# Patient Record
Sex: Female | Born: 1955 | Race: White | Hispanic: No | Marital: Married | Smoking: Never smoker
Health system: Southern US, Community
[De-identification: ages and names within clinical notes are randomized; demographics above are authoritative.]

## PROBLEM LIST (undated history)

## (undated) DIAGNOSIS — I341 Nonrheumatic mitral (valve) prolapse: Secondary | ICD-10-CM

## (undated) DIAGNOSIS — J45909 Unspecified asthma, uncomplicated: Secondary | ICD-10-CM

## (undated) DIAGNOSIS — M797 Fibromyalgia: Secondary | ICD-10-CM

## (undated) DIAGNOSIS — D0371 Melanoma in situ of right lower limb, including hip: Secondary | ICD-10-CM

## (undated) DIAGNOSIS — K589 Irritable bowel syndrome without diarrhea: Secondary | ICD-10-CM

## (undated) DIAGNOSIS — S098XXA Other specified injuries of head, initial encounter: Secondary | ICD-10-CM

## (undated) DIAGNOSIS — R740 Nonspecific elevation of levels of transaminase and lactic acid dehydrogenase [LDH]: Secondary | ICD-10-CM

## (undated) DIAGNOSIS — E785 Hyperlipidemia, unspecified: Secondary | ICD-10-CM

## (undated) DIAGNOSIS — I878 Other specified disorders of veins: Secondary | ICD-10-CM

## (undated) HISTORY — DX: Other specified injuries of head, initial encounter: S09.8XXA

## (undated) HISTORY — DX: Irritable bowel syndrome, unspecified: K58.9

## (undated) HISTORY — DX: Nonrheumatic mitral (valve) prolapse: I34.1

## (undated) HISTORY — DX: Hyperlipidemia, unspecified: E78.5

## (undated) HISTORY — DX: Melanoma in situ of right lower limb, including hip: D03.71

## (undated) HISTORY — PX: SALPINGOOPHORECTOMY: SHX82

## (undated) HISTORY — DX: Unspecified asthma, uncomplicated: J45.909

## (undated) HISTORY — DX: Other specified disorders of veins: I87.8

## (undated) HISTORY — DX: Nonspecific elevation of levels of transaminase and lactic acid dehydrogenase (ldh): R74.0

## (undated) HISTORY — DX: Fibromyalgia: M79.7

---

## 1984-06-04 HISTORY — PX: VAGINAL HYSTERECTOMY: SUR661

## 1984-06-04 HISTORY — PX: PARTIAL HYSTERECTOMY: SHX80

## 1986-12-05 DIAGNOSIS — S098XXA Other specified injuries of head, initial encounter: Secondary | ICD-10-CM

## 1986-12-05 HISTORY — DX: Other specified injuries of head, initial encounter: S09.8XXA

## 1987-06-05 HISTORY — PX: CRANIOTOMY: SHX93

## 1987-06-18 DIAGNOSIS — G43109 Migraine with aura, not intractable, without status migrainosus: Secondary | ICD-10-CM | POA: Insufficient documentation

## 1988-03-05 HISTORY — PX: CHOLECYSTECTOMY: SHX55

## 1993-12-05 HISTORY — PX: BREAST BIOPSY: SHX20

## 2005-11-22 ENCOUNTER — Other Ambulatory Visit: Admission: RE | Admit: 2005-11-22 | Discharge: 2005-11-22 | Payer: Self-pay | Admitting: Obstetrics and Gynecology

## 2007-08-06 HISTORY — PX: APPENDECTOMY: SHX54

## 2010-11-05 ENCOUNTER — Encounter: Payer: Self-pay | Admitting: Emergency Medicine

## 2010-11-05 DIAGNOSIS — R030 Elevated blood-pressure reading, without diagnosis of hypertension: Secondary | ICD-10-CM | POA: Insufficient documentation

## 2010-11-30 ENCOUNTER — Ambulatory Visit: Payer: Self-pay | Admitting: Family Medicine

## 2010-11-30 DIAGNOSIS — J4540 Moderate persistent asthma, uncomplicated: Secondary | ICD-10-CM | POA: Insufficient documentation

## 2010-11-30 DIAGNOSIS — J45909 Unspecified asthma, uncomplicated: Secondary | ICD-10-CM

## 2010-11-30 DIAGNOSIS — J4520 Mild intermittent asthma, uncomplicated: Secondary | ICD-10-CM | POA: Insufficient documentation

## 2010-11-30 DIAGNOSIS — J4530 Mild persistent asthma, uncomplicated: Secondary | ICD-10-CM | POA: Insufficient documentation

## 2010-12-16 ENCOUNTER — Ambulatory Visit
Admission: RE | Admit: 2010-12-16 | Discharge: 2010-12-16 | Payer: Self-pay | Source: Home / Self Care | Attending: Family Medicine | Admitting: Family Medicine

## 2010-12-16 DIAGNOSIS — I10 Essential (primary) hypertension: Secondary | ICD-10-CM

## 2010-12-26 ENCOUNTER — Encounter: Payer: Self-pay | Admitting: Obstetrics and Gynecology

## 2011-01-06 NOTE — Progress Notes (Signed)
Summary: Med & Problem List Brought by Patient  Med & Problem List Brought by Patient   Imported By: Lanelle Bal 12/13/2010 11:44:31  _____________________________________________________________________  External Attachment:    Type:   Image     Comment:   External Document
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
## 2011-01-06 NOTE — Assessment & Plan Note (Signed)
Summary: BP check with Nurse  Nurse Visit   Vital Signs:  Patient profile:   55 year old female Pulse rate:   80 / minute BP sitting:   159 / 92  (right arm) Cuff size:   regular  Vitals Entered By: Avon Gully CMA, Duncan Dull) (December 16, 2010 4:33 PM)  History of Present Illness: BP check    Impression & Recommendations:  Problem # 1:  HYPERTENSION (ICD-401.9)  Her BP was some better but definitely still has HTN.  Needs to start a medication and f/U with me in one month.   BP today: 159/92 Prior BP: 172/110 (11/30/2010)  Her updated medication list for this problem includes:    Losartan Potassium 25 Mg Tabs (Losartan potassium) .Marland Kitchen... 1/2 tab by mouth daily for one week, then inc to whole tab daily  Complete Medication List: 1)  Accolate 20 Mg Tabs (Zafirlukast) .... Take 1 tab by mouth two times a day 2)  Allegra Allergy 180 Mg Tabs (Fexofenadine hcl) .... Take 1 tab by mouth two times a day 3)  Tramadol Hcl 50 Mg Tabs (Tramadol hcl) .... Take 1 tab by mouth two times a day 4)  Digestive Enzymes Tabs (Digestive enzymes) 5)  Gnp Evening Primrose Oil 500 Mg Caps (Evening primrose oil) 6)  Feverfew 380 Mg Caps (Feverfew) 7)  Fish Oil Concentrate 1000 Mg Caps (Omega-3 fatty acids) 8)  Magnesium 200 Mg Tabs (Magnesium) 9)  Multivitamins Tabs (Multiple vitamin) 10)  Probiotic Caps (Probiotic product) 11)  Red Yeast Rice 600 Mg Caps (Red yeast rice extract) 12)  Proair Hfa 108 (90 Base) Mcg/act Aers (Albuterol sulfate) .... 2-4 puffs inhaled every 4-6 hours as needed shortness of breath 13)  Losartan Potassium 25 Mg Tabs (Losartan potassium) .... 1/2 tab by mouth daily for one week, then inc to whole tab daily    Allergies (verified): 1)  ! Biaxin (Clarithromycin) 2)  ! Carafate (Sucralfate) 3)  ! Cipro (Ciprofloxacin Hcl) 4)  ! Codeine Sulfate (Codeine Sulfate) 5)  ! Darvocet 6)  ! Darvon 7)  ! Demerol 8)  ! Dyazide 9)  ! Erythromycin 10)  ! Fiorinal  (Butalbital-Aspirin-Caffeine) 11)  ! Hydrochlorothiazide 12)  ! Indocin 13)  ! Motrin 14)  ! Naprosyn 15)  ! Pcn 16)  ! Sulfa 17)  ! Tagamet 18)  ! Talwin 19)  ! Tetracycline 20)  ! Toradol 21)  ! Vicodin 22)  ! * Achromycin 23)  ! * Centrax 24)  ! * Pathibamate 25)  ! * Probanthine 26)  ! Stadol Prescriptions: LOSARTAN POTASSIUM 25 MG TABS (LOSARTAN POTASSIUM) 1/2 tab by mouth daily for one week, then inc to whole tab daily  #30 x 1   Entered and Authorized by:   Nani Gasser MD   Signed by:   Nani Gasser MD on 12/17/2010   Method used:   Electronically to        [id number] (retail)       [address]       [place], [place]  [address]       Ph: [phone]       Fax: [phone]   RxID:   [id number]

## 2011-01-06 NOTE — Assessment & Plan Note (Signed)
Summary: Ear pain, chest congestion rm 4   Vital Signs:  Patient Profile:   55 Years Old Female CC:      Cold & URI symptoms O2 Sat:      100 % O2 treatment:    Room Air Temp:     97.5 degrees F oral Pulse rate:   86 / minute Pulse rhythm:   regular Resp:     16 per minute BP sitting:   156 / 87  (left arm) Cuff size:   regular  Vitals Entered By: Areta Haber CMA (November 05, 2010 4:26 PM)                History of Present Illness Chief Complaint: Cold & URI symptoms  Current Problems: UPPER RESPIRATORY INFECTION, ACUTE (ICD-465.9) HYPERTENSION (ICD-401.9)   REVIEW OF SYSTEMS Constitutional Symptoms      Denies fever, chills, night sweats, weight loss, weight gain, and fatigue.  Eyes       Denies change in vision, eye pain, eye discharge, glasses, contact lenses, and eye surgery. Ear/Nose/Throat/Mouth       Complains of ear pain.      Denies hearing loss/aids, change in hearing, ear discharge, dizziness, frequent runny nose, frequent nose bleeds, sinus problems, sore throat, hoarseness, and tooth pain or bleeding.  Respiratory       Complains of shortness of breath.      Denies dry cough, productive cough, wheezing, asthma, bronchitis, and emphysema/COPD.  Cardiovascular       Denies murmurs, chest pain, and tires easily with exhertion.    Gastrointestinal       Denies stomach pain, nausea/vomiting, diarrhea, constipation, blood in bowel movements, and indigestion. Genitourniary       Denies painful urination, kidney stones, and loss of urinary control. Neurological       Denies paralysis, seizures, and fainting/blackouts. Musculoskeletal       Denies muscle pain, joint pain, joint stiffness, decreased range of motion, redness, swelling, muscle weakness, and gout.  Skin       Denies bruising, unusual mles/lumps or sores, and hair/skin or nail changes.  Psych       Denies mood changes, temper/anger issues, anxiety/stress, speech problems, depression, and  sleep problems. Other Comments: Pt does not have a PCP, card given.   Past History:  Past Medical History: Hypertension IBS Fibromyalgia Assessment New Problems: UPPER RESPIRATORY INFECTION, ACUTE (ICD-465.9) HYPERTENSION (ICD-401.9)   Plan New Orders: No Charge Patient Arrived (NCPA0) [NCPA0] Planning Comments:   Cold and URI symptoms and SOB for 1-2 weeks.  Patient was triaged by nurse.  Vital signs are completely stable.  Due to patient's history of allergies to many oral antibiotics (penicillins, macrolides, floroquinolones, sulfas, and tetracyclines) including some reactions that required a trip to the ER,  I have sent patient to the ER.  I don't feel comfortable prescribing anything that will have a severe reaction and I think she should be monitored in a controlled setting after given any medication for 1-2 hours.  Unfortunately with her history of reactions, this may be appropriate for all future visits.  No physical exam or history beyond this was performed.  The husband is here and will drive her to the ER.   The patient and/or caregiver has been counseled thoroughly with regard to medications prescribed including dosage, schedule, interactions, rationale for use, and possible side effects and they verbalize understanding.  Diagnoses and expected course of recovery discussed and will return if not improved as expected  or if the condition worsens. Patient and/or caregiver verbalized understanding.   Orders Added: 1)  No Charge Patient Arrived (NCPA0) [NCPA0]

## 2011-01-27 ENCOUNTER — Encounter: Payer: Self-pay | Admitting: Family Medicine

## 2011-01-27 LAB — CONVERTED CEMR LAB
ALT: 27 units/L (ref 0–35)
AST: 30 units/L (ref 0–37)
Albumin: 4.8 g/dL (ref 3.5–5.2)
Alkaline Phosphatase: 67 units/L (ref 39–117)
BUN: 9 mg/dL (ref 6–23)
CO2: 24 meq/L (ref 19–32)
Calcium: 9.7 mg/dL (ref 8.4–10.5)
Chloride: 104 meq/L (ref 96–112)
Cholesterol, target level: 200 mg/dL
Cholesterol: 234 mg/dL — ABNORMAL HIGH (ref 0–200)
Creatinine, Ser: 0.73 mg/dL (ref 0.40–1.20)
Glucose, Bld: 92 mg/dL (ref 70–99)
HDL goal, serum: 40 mg/dL
HDL: 61 mg/dL (ref >39)
LDL Cholesterol: 146 mg/dL — ABNORMAL HIGH (ref 0–99)
LDL Goal: 160 mg/dL
Potassium: 4 meq/L (ref 3.5–5.3)
Sodium: 140 meq/L (ref 135–145)
TSH: 3.442 microintl units/mL (ref 0.350–4.500)
Total Bilirubin: 0.5 mg/dL (ref 0.3–1.2)
Total CHOL/HDL Ratio: 3.8
Total Protein: 7.8 g/dL (ref 6.0–8.3)
Triglycerides: 134 mg/dL (ref <150)
VLDL: 27 mg/dL (ref 0–40)

## 2011-03-24 ENCOUNTER — Encounter: Payer: Self-pay | Admitting: Family Medicine

## 2011-03-25 ENCOUNTER — Ambulatory Visit (INDEPENDENT_AMBULATORY_CARE_PROVIDER_SITE_OTHER): Payer: Medicare Other | Admitting: Family Medicine

## 2011-03-25 DIAGNOSIS — M8580 Other specified disorders of bone density and structure, unspecified site: Secondary | ICD-10-CM

## 2011-03-25 DIAGNOSIS — M899 Disorder of bone, unspecified: Secondary | ICD-10-CM

## 2011-03-25 DIAGNOSIS — T7840XA Allergy, unspecified, initial encounter: Secondary | ICD-10-CM

## 2011-03-25 DIAGNOSIS — IMO0001 Reserved for inherently not codable concepts without codable children: Secondary | ICD-10-CM

## 2011-03-25 DIAGNOSIS — R030 Elevated blood-pressure reading, without diagnosis of hypertension: Secondary | ICD-10-CM

## 2011-03-25 MED ORDER — PREDNISONE 20 MG PO TABS: ORAL_TABLET | ORAL | Status: DC

## 2011-03-25 NOTE — Assessment & Plan Note (Signed)
Home BPs have been 109-124/72-29.  Well controlled. Will continue to monitor.

## 2011-03-25 NOTE — Patient Instructions (Signed)
We will call you with your lab results 

## 2011-03-25 NOTE — Progress Notes (Signed)
  Subjective:    Patient ID: Breanna Casey, female    DOB: 06/01/1956, 55 y.o.   MRN: [id number]  HPI About 10 days ago noticed a red ring around her mouth and very dry but it finally resolved. Then about 6 days ago noticed a redness on her right inner arm and then on her chest. Says it is not as bright.  Also has some lesions on her legs.  It was very ithcy.  Was taking benadryl with no relief. No new soaps conditioners, perfumes, etc. No med changes.  No SOB or wheezing.  Has some food allergies, peanut and mushrooms but never formally tested.   Brought in her BPS from home.  Says BP always high when   Review of Systems     Objective:   Physical Exam  Constitutional: She appears well-developed and well-nourished.  Cardiovascular: Normal rate, regular rhythm and normal heart sounds.   Pulmonary/Chest: Effort normal and breath sounds normal.  Skin: Skin is warm and dry. Rash noted.       eythematous macular splotchy rash on her upper chest and back with a few areas on her upper arm and her facial cheeks.           Assessment & Plan:  Urticaria. She actually says this has improved. She thinks it could have been a food. She would like to have the blood testing for food allergies. She declined an allergy referral if they are only going to do the sin prick. Discussed options including letting this resovle on its own since her rash has improved or using steroids. She prefers steroids. F/U if not better in 10 days.   Osteopenia - She would like to have a vit D level checked.

## 2011-04-05 ENCOUNTER — Telehealth: Payer: Self-pay | Admitting: Family Medicine

## 2011-04-05 NOTE — Telephone Encounter (Signed)
Pt called and hasn't heard regarding her lab results on 4-201-12.  It was allergies and Vit D levels.  [employer] and they will fax over a copy to our office since no showing results in our system. PLAN:  Pt notified and she request a copy to be mailed to her home address.  Told pt will send to her. Jarvis Newcomer, LPN [facility] [place]imes

## 2011-04-05 NOTE — Telephone Encounter (Signed)
Patient called advised that she received a bill from [facility] from Jan 26, 2011 about a coding issue that Medicare wont pay for unless the coding is changed. Patient advised she needs Dr. Linford Arnold  to change the coding on date of service 01/26/11 that was sent to [facility] so her insurance can pay for it. Patient request a call back [phone] about that date of service from Dr. Linford Arnold.

## 2011-04-06 ENCOUNTER — Telehealth: Payer: Self-pay | Admitting: Family Medicine

## 2011-04-06 NOTE — Telephone Encounter (Signed)
Called and discussed her allergy testing and her vitamin D. We will send copy in the mail.

## 2011-04-06 NOTE — Telephone Encounter (Signed)
Call [facility] and see if we can add Code for screening lipids to cover the lipid panel or use 401.1

## 2011-04-11 ENCOUNTER — Telehealth: Payer: Self-pay | Admitting: Family Medicine

## 2011-04-11 NOTE — Telephone Encounter (Signed)
Needs results from labs last week.  Lab results were mailed to her per her request but there wasn't report out of shrimp/ fish testing.  Please advise. Jarvis Newcomer, LPN [place]

## 2011-04-11 NOTE — Telephone Encounter (Signed)
Call lab and see if can use 401.9 (hypertension).

## 2011-04-11 NOTE — Telephone Encounter (Signed)
Patient called in request for Dr. Linford Arnold to change codes on her 01/28/2011 lab charge from [place] [facility] pertaining to thyroid. Patient states her insurance will not cover the charge unless the codes are changed by the doctor. Patient request a call back about date of service

## 2011-04-12 NOTE — Telephone Encounter (Signed)
Did not see results in computer for IgG food panel . Called [facility] and they will call me back

## 2011-04-19 ENCOUNTER — Encounter: Payer: Self-pay | Admitting: Family Medicine

## 2011-04-19 DIAGNOSIS — M797 Fibromyalgia: Secondary | ICD-10-CM | POA: Insufficient documentation

## 2011-04-19 DIAGNOSIS — J309 Allergic rhinitis, unspecified: Secondary | ICD-10-CM | POA: Insufficient documentation

## 2011-04-22 NOTE — Telephone Encounter (Signed)
Dr. Linford Arnold has talked to pt about her lab results. Need to get copy of labs and mail to pt

## 2011-04-27 ENCOUNTER — Encounter: Payer: Self-pay | Admitting: Family Medicine

## 2011-05-04 ENCOUNTER — Encounter: Payer: Self-pay | Admitting: Family Medicine

## 2011-08-11 ENCOUNTER — Encounter: Payer: Self-pay | Admitting: Family Medicine

## 2011-08-11 ENCOUNTER — Ambulatory Visit (INDEPENDENT_AMBULATORY_CARE_PROVIDER_SITE_OTHER): Payer: Medicare Other | Admitting: Family Medicine

## 2011-08-11 DIAGNOSIS — M79609 Pain in unspecified limb: Secondary | ICD-10-CM

## 2011-08-11 DIAGNOSIS — I10 Essential (primary) hypertension: Secondary | ICD-10-CM

## 2011-08-11 DIAGNOSIS — R600 Localized edema: Secondary | ICD-10-CM

## 2011-08-11 DIAGNOSIS — R002 Palpitations: Secondary | ICD-10-CM

## 2011-08-11 DIAGNOSIS — M79669 Pain in unspecified lower leg: Secondary | ICD-10-CM

## 2011-08-11 DIAGNOSIS — R609 Edema, unspecified: Secondary | ICD-10-CM

## 2011-08-11 MED ORDER — EPINEPHRINE 0.3 MG/0.3ML IJ DEVI: 0.3000 mg | Freq: Once | INTRAMUSCULAR | Status: DC

## 2011-08-11 NOTE — Progress Notes (Signed)
  Subjective:    Patient ID: Breanna Casey, female    DOB: 18-Aug-1956, 55 y.o.   MRN: [id number]  HPI Noticed bilateraly calf pain about 2 weeks ago.  No injury. Worse on the LEFT.  Make her 2nd toe numb on the LT foot when worse. Also noticed LE edema during this time as well.  Feels sore and tender.  Painful with walking. No real alleviating sxs. Still painful at rest.  Denies any muscle Casey.  Feels really tight tought. Having palpitations as wel that started about 1 week ago. No known triggers. Just lasts a few seconds  .  No recent travel or sitting for prolonged periods.     Review of Systems     Objective:   Physical Exam  Constitutional: She is oriented to person, place, and time. She appears well-developed and well-nourished.  HENT:  Head: Normocephalic and atraumatic.  Cardiovascular: Normal rate, regular rhythm and normal heart sounds.   Pulmonary/Chest: Effort normal and breath sounds normal.  Musculoskeletal:       Tenderness over the left lower calf.  She has 1+ edema in both feet bilaterally started at the ankles.    Neurological: She is alert and oriented to person, place, and time.  Skin: Skin is warm and dry.  Psychiatric: She has a normal mood and affect.          Assessment & Plan:  Palpitations- EKG shows rate of 78 bpm, NSR, no acute changes. This is reassuring.   HTN - Not well controlled. Likely secondary to volume overload but she can't take diuretic secondary to allergic reactions. Decrease salt intake.   Calf Pain- Refer for eval for DVT.  Dopplers scheduled this afternoon.  If neg then will check CMP and CBC na dTSH tomorrow.    Severe allergies - Given rx for epipen.

## 2011-08-11 NOTE — Patient Instructions (Signed)
Increase losartan to 2 tab daily.

## 2011-08-12 ENCOUNTER — Telehealth: Payer: Self-pay | Admitting: Family Medicine

## 2011-08-12 ENCOUNTER — Encounter: Payer: Self-pay | Admitting: Family Medicine

## 2011-08-12 NOTE — Telephone Encounter (Signed)
Printed and faxed

## 2011-08-12 NOTE — Telephone Encounter (Signed)
Breanna Casey with [facility] called stated they seen Breanna Casey last night for ultrasound and they received a order from our office but Dr. Shelah Lewandowsky signature was not on order and they request to know if they can have the same order faxed to their office w/ a signature..Fax number [phone]

## 2011-08-31 ENCOUNTER — Ambulatory Visit (INDEPENDENT_AMBULATORY_CARE_PROVIDER_SITE_OTHER): Payer: Medicare Other | Admitting: Family Medicine

## 2011-08-31 ENCOUNTER — Encounter: Payer: Self-pay | Admitting: Family Medicine

## 2011-08-31 VITALS — BP 159/91 | HR 73 | Wt 150.0 lb

## 2011-08-31 DIAGNOSIS — R609 Edema, unspecified: Secondary | ICD-10-CM

## 2011-08-31 DIAGNOSIS — F418 Other specified anxiety disorders: Secondary | ICD-10-CM

## 2011-08-31 DIAGNOSIS — M797 Fibromyalgia: Secondary | ICD-10-CM

## 2011-08-31 DIAGNOSIS — IMO0001 Reserved for inherently not codable concepts without codable children: Secondary | ICD-10-CM

## 2011-08-31 DIAGNOSIS — I878 Other specified disorders of veins: Secondary | ICD-10-CM | POA: Insufficient documentation

## 2011-08-31 DIAGNOSIS — R030 Elevated blood-pressure reading, without diagnosis of hypertension: Secondary | ICD-10-CM

## 2011-08-31 DIAGNOSIS — F341 Dysthymic disorder: Secondary | ICD-10-CM

## 2011-08-31 DIAGNOSIS — I872 Venous insufficiency (chronic) (peripheral): Secondary | ICD-10-CM

## 2011-08-31 DIAGNOSIS — R600 Localized edema: Secondary | ICD-10-CM

## 2011-08-31 NOTE — Progress Notes (Signed)
  Subjective:    Patient ID: Breanna Casey, female    DOB: 1955-12-27, 55 y.o.   MRN: [id number]  HPI  Home BPs 120/70 so not taking her losaratan which I had told her was OK.   She still complains of lower extremity edema. We did schedule her for venous Dopplers to rule out DVT and these were negative. Dr. Alwyn Ren report on file we'll call and make sure I get the official report be filed into her chart. I suspect that she may have some element of venous stasis. Having a chest discomfort at night every night fot he last 5 nights and it i keeping her awake. Says feels fine when she sits up. Says it feels like something is moving in a circle at the center of the chest.  Increased thirst last night.  Her father had kidney cancer, as she is worried about kidney disease. Her father was 4 years old when diagnosed.. We did do an EKG on her last visit that was normal.  Review of Systems     Objective:   Physical Exam  Constitutional: She is oriented to person, place, and time. She appears well-developed and well-nourished.  HENT:  Head: Normocephalic and atraumatic.  Cardiovascular: Normal rate, regular rhythm and normal heart sounds.   Pulmonary/Chest: Effort normal and breath sounds normal.  Musculoskeletal:       1+ dorsal pedal swelling bilt. Trace at the ankles.   Neurological: She is alert and oriented to person, place, and time.  Skin: Skin is warm and dry.  Psychiatric: She has a normal mood and affect. Her behavior is normal.          Assessment & Plan:  LE edema - Likely venous stasis. I will check a urine as well as a serum creatinine today. She had her thyroid checked back in February that was normal. I will also check a CBC to rule out anemia. If these are all normal then I would recommend compression stockings to help control her swelling. She already has a very tightly controlled diet and says she eats minimal sodium.

## 2011-08-31 NOTE — Assessment & Plan Note (Deleted)
She is now back on Lyrica 50 mg once a day. She says if she takes it twice a day she feels too sleepy. 

## 2011-08-31 NOTE — Assessment & Plan Note (Signed)
Home BPs are well controlled.   

## 2011-08-31 NOTE — Assessment & Plan Note (Deleted)
Her PHQ 9 score is 16, and her GAD-7 score is 11 today. I really don't think she's not well controlled. She has our number fluoxetine 60 mg. I really think she would benefit from some type of counseling. She really is having difficulty focusing on the topic today. They recently restarted her Lyrica which hopefully will help with her pain. She feels that part of her mood and feeling down is related to the fact that she feels she is constantly in pain. So hopefully this will help. I would like to see her back in a couple of months. We could consider a mood stabilizer if she still not improving at that point in time.

## 2011-09-01 ENCOUNTER — Telehealth: Payer: Self-pay | Admitting: *Deleted

## 2011-09-01 LAB — BASIC METABOLIC PANEL WITH GFR
BUN: 10 mg/dL (ref 6–23)
CO2: 28 mEq/L (ref 19–32)
Calcium: 9.8 mg/dL (ref 8.4–10.5)
Chloride: 102 mEq/L (ref 96–112)
Creat: 0.78 mg/dL (ref 0.50–1.10)
GFR, Est African American: >60 mL/min (ref >60)
GFR, Est Non African American: >60 mL/min (ref >60)
Glucose, Bld: 89 mg/dL (ref 70–99)
Potassium: 4.2 mEq/L (ref 3.5–5.3)
Sodium: 142 mEq/L (ref 135–145)

## 2011-09-01 LAB — CBC
HCT: 42.2 % (ref 36.0–46.0)
Hemoglobin: 14.2 g/dL (ref 12.0–15.0)
MCH: 29.6 pg (ref 26.0–34.0)
MCHC: 33.6 g/dL (ref 30.0–36.0)
MCV: 88.1 fL (ref 78.0–100.0)
Platelets: 218 10*3/uL (ref 150–400)
RBC: 4.79 MIL/uL (ref 3.87–5.11)
RDW: 13.1 % (ref 11.5–15.5)
WBC: 4.2 10*3/uL (ref 4.0–10.5)

## 2011-09-01 LAB — POCT URINALYSIS DIPSTICK
Bilirubin, UA: NEGATIVE
Blood, UA: NEGATIVE
Glucose, UA: NEGATIVE
Ketones, UA: NEGATIVE
Leukocytes, UA: NEGATIVE
Nitrite, UA: NEGATIVE
Protein, UA: NEGATIVE
Spec Grav, UA: 1.01
Urobilinogen, UA: 0.2
pH, UA: 7.5

## 2011-09-01 NOTE — Telephone Encounter (Signed)
Called and spoke with husband and her willhave pt to call if she wants to see card

## 2011-09-01 NOTE — Telephone Encounter (Signed)
Would she like a rx for the compression stockings?

## 2011-09-01 NOTE — Telephone Encounter (Signed)
Pt returning call to our office.  Went to messages and read off the message pt needed .  Pt told to call back and speak with Sue Lush if interested in doing card referral for chest pain.  ALso need to know if interested in doing compression stockings. LMOM for the pt at the number she left. Jarvis Newcomer, LPN [facility]

## 2011-09-01 NOTE — Progress Notes (Signed)
Addended by: Wyline Beady on: 09/01/2011 08:16 AM   Modules accepted: Orders

## 2011-09-01 NOTE — Telephone Encounter (Signed)
Message copied by Wyline Beady on Thu Sep 01, 2011  8:51 AM ------      Message from: Nani Gasser D      Created: Thu Sep 01, 2011  8:09 AM       Blood count and BMP is normal. Kidney function looks great. Would she be will to consider seeing cards for the chest pain she is getting.

## 2011-09-02 ENCOUNTER — Telehealth: Payer: Self-pay | Admitting: *Deleted

## 2011-09-02 MED ORDER — MEDICAL COMPRESSION STOCKINGS MISC: 1.0000 | Freq: Every day | Status: DC

## 2011-09-02 NOTE — Telephone Encounter (Signed)
Order placed

## 2011-09-02 NOTE — Telephone Encounter (Signed)
Pt called and states she does not want to see a cardiologist. Pt does want rx for compression stockings mailed to her.

## 2011-09-14 ENCOUNTER — Encounter: Payer: Self-pay | Admitting: Family Medicine

## 2011-09-16 ENCOUNTER — Telehealth: Payer: Self-pay | Admitting: Family Medicine

## 2011-09-16 NOTE — Telephone Encounter (Signed)
Pt is calling to check on cardiology referral. Plan:  When looking back at the telephone notes it said pt would call back if she was interested in doing a cardiology referral for her chest pain.  Pt is interested and would like to proceed with card referral.  Will send to Dr. Thurmond Butts to give the V.O for the card referral for chest pain. ROuted to Dr. Thurmond Butts. Jarvis Newcomer, LPN [facility]

## 2011-09-17 NOTE — Telephone Encounter (Signed)
Yes Breanna Casey please set her up w/a Cardiologist appointment.

## 2011-09-20 ENCOUNTER — Telehealth: Payer: Self-pay | Admitting: Family Medicine

## 2011-09-20 NOTE — Telephone Encounter (Signed)
Referral entered per Dr Thurmond Butts order for ambulatory card referral. Breanna Newcomer, LPN [facility]

## 2011-09-28 NOTE — Telephone Encounter (Signed)
Closed

## 2011-10-05 ENCOUNTER — Ambulatory Visit: Payer: Medicare Other | Admitting: Cardiology

## 2011-10-20 ENCOUNTER — Ambulatory Visit (INDEPENDENT_AMBULATORY_CARE_PROVIDER_SITE_OTHER): Payer: Medicare Other | Admitting: Cardiology

## 2011-10-20 ENCOUNTER — Encounter: Payer: Self-pay | Admitting: Cardiology

## 2011-10-20 VITALS — BP 122/78 | HR 78 | Ht 66.0 in | Wt 150.4 lb

## 2011-10-20 DIAGNOSIS — I059 Rheumatic mitral valve disease, unspecified: Secondary | ICD-10-CM

## 2011-10-20 DIAGNOSIS — R079 Chest pain, unspecified: Secondary | ICD-10-CM

## 2011-10-20 DIAGNOSIS — I341 Nonrheumatic mitral (valve) prolapse: Secondary | ICD-10-CM

## 2011-10-20 NOTE — Patient Instructions (Signed)
Your physician has requested that you have an echocardiogram. Echocardiography is a painless test that uses sound waves to create images of your heart. It provides your doctor with information about the size and shape of your heart and how well your heart's chambers and valves are working. This procedure takes approximately one hour. There are no restrictions for this procedure.   
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
## 2011-10-20 NOTE — Assessment & Plan Note (Signed)
Symptoms atypical. Will not pursue ischemia evaluation. Schedule echocardiogram to evaluate mitral valve prolapse.

## 2011-10-20 NOTE — Assessment & Plan Note (Signed)
Schedule echocardiogram to assess mitral valve. I do not hear mitral regurgitation on examination.

## 2011-11-03 ENCOUNTER — Ambulatory Visit (HOSPITAL_COMMUNITY): Payer: Medicare Other | Attending: Cardiology | Admitting: Radiology

## 2011-11-03 DIAGNOSIS — I059 Rheumatic mitral valve disease, unspecified: Secondary | ICD-10-CM | POA: Insufficient documentation

## 2011-11-03 DIAGNOSIS — I079 Rheumatic tricuspid valve disease, unspecified: Secondary | ICD-10-CM | POA: Insufficient documentation

## 2011-11-03 DIAGNOSIS — I341 Nonrheumatic mitral (valve) prolapse: Secondary | ICD-10-CM

## 2011-11-03 DIAGNOSIS — R072 Precordial pain: Secondary | ICD-10-CM | POA: Insufficient documentation

## 2011-11-03 DIAGNOSIS — E785 Hyperlipidemia, unspecified: Secondary | ICD-10-CM | POA: Insufficient documentation

## 2011-12-02 ENCOUNTER — Ambulatory Visit (INDEPENDENT_AMBULATORY_CARE_PROVIDER_SITE_OTHER): Payer: Medicare Other | Admitting: Family Medicine

## 2011-12-02 ENCOUNTER — Encounter: Payer: Self-pay | Admitting: Family Medicine

## 2011-12-02 DIAGNOSIS — H9209 Otalgia, unspecified ear: Secondary | ICD-10-CM

## 2011-12-02 DIAGNOSIS — J069 Acute upper respiratory infection, unspecified: Secondary | ICD-10-CM

## 2011-12-02 MED ORDER — PREDNISONE 1 MG PO TABS: 1.0000 mg | ORAL_TABLET | Freq: Every day | ORAL | Status: DC | PRN

## 2011-12-02 MED ORDER — ANTIPYRINE-BENZOCAINE 5.4-1.4 % OT SOLN: 3.0000 [drp] | Freq: Four times a day (QID) | OTIC | Status: AC | PRN

## 2011-12-02 NOTE — Progress Notes (Signed)
  Subjective:    Patient ID: Breanna Casey, female    DOB: May 02, 1956, 55 y.o.   MRN: [id number]  HPI  Sick for 2 weeks. Right ear pain, sinus pressure and pain. Sinus pain is better. + postnasal drip. Left side of chest hurt and right lower ribs are painful. Mild dry, occ productive, cough. No fever.  No N/V/D.  Mild ST. No meds.  No sinus meds.. Starting sneezing. She feels she is getting better the last couple of days.   Review of Systems     Objective:   Physical Exam  Constitutional: She is oriented to person, place, and time. She appears well-developed and well-nourished.  HENT:  Head: Normocephalic and atraumatic.  Right Ear: External ear normal.  Left Ear: External ear normal.  Nose: Nose normal.  Mouth/Throat: Oropharynx is clear and moist.       TMs and canals are clear.   Eyes: Conjunctivae and EOM are normal. Pupils are equal, round, and reactive to light.  Neck: Neck supple. No thyromegaly present.  Cardiovascular: Normal rate, regular rhythm and normal heart sounds.   Pulmonary/Chest: Effort normal and breath sounds normal. She has no wheezes.  Lymphadenopathy:    She has no cervical adenopathy.  Neurological: She is alert and oriented to person, place, and time.  Skin: Skin is warm and dry.  Psychiatric: She has a normal mood and affect.          Assessment & Plan:  Otalgia- Exam is normal and her ear pain is actually getting better. Can use auralgan drops  URI - Resolving. Call if suddenly gets worse, running fever or not getting completely better.

## 2011-12-02 NOTE — Patient Instructions (Signed)
Call me if not better on Monday.

## 2012-03-22 DIAGNOSIS — R1031 Right lower quadrant pain: Secondary | ICD-10-CM | POA: Diagnosis not present

## 2012-03-26 DIAGNOSIS — R1011 Right upper quadrant pain: Secondary | ICD-10-CM | POA: Diagnosis not present

## 2012-04-04 DIAGNOSIS — R1031 Right lower quadrant pain: Secondary | ICD-10-CM | POA: Diagnosis not present

## 2012-04-19 DIAGNOSIS — R197 Diarrhea, unspecified: Secondary | ICD-10-CM | POA: Diagnosis not present

## 2012-04-19 DIAGNOSIS — R1031 Right lower quadrant pain: Secondary | ICD-10-CM | POA: Diagnosis not present

## 2012-04-19 DIAGNOSIS — D126 Benign neoplasm of colon, unspecified: Secondary | ICD-10-CM | POA: Diagnosis not present

## 2012-04-20 DIAGNOSIS — Z1231 Encounter for screening mammogram for malignant neoplasm of breast: Secondary | ICD-10-CM | POA: Diagnosis not present

## 2012-04-25 DIAGNOSIS — R1031 Right lower quadrant pain: Secondary | ICD-10-CM | POA: Diagnosis not present

## 2012-04-25 DIAGNOSIS — Z01419 Encounter for gynecological examination (general) (routine) without abnormal findings: Secondary | ICD-10-CM | POA: Diagnosis not present

## 2012-04-25 DIAGNOSIS — N952 Postmenopausal atrophic vaginitis: Secondary | ICD-10-CM | POA: Diagnosis not present

## 2012-05-09 DIAGNOSIS — R1031 Right lower quadrant pain: Secondary | ICD-10-CM | POA: Diagnosis not present

## 2012-06-18 ENCOUNTER — Ambulatory Visit (INDEPENDENT_AMBULATORY_CARE_PROVIDER_SITE_OTHER): Payer: Medicare Other | Admitting: Physician Assistant

## 2012-06-18 ENCOUNTER — Encounter: Payer: Self-pay | Admitting: Physician Assistant

## 2012-06-18 VITALS — BP 154/94 | HR 78 | Temp 98.3°F | Ht 66.0 in | Wt 144.0 lb

## 2012-06-18 DIAGNOSIS — J3489 Other specified disorders of nose and nasal sinuses: Secondary | ICD-10-CM

## 2012-06-18 DIAGNOSIS — R209 Unspecified disturbances of skin sensation: Secondary | ICD-10-CM

## 2012-06-18 DIAGNOSIS — R200 Anesthesia of skin: Secondary | ICD-10-CM

## 2012-06-18 DIAGNOSIS — H9209 Otalgia, unspecified ear: Secondary | ICD-10-CM

## 2012-06-18 MED ORDER — METHYLPREDNISOLONE 4 MG PO KIT: PACK | ORAL | Status: AC
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
## 2012-06-18 NOTE — Patient Instructions (Addendum)
Start medrol dose pack. Call if not improving. Will refer to neurology.

## 2012-07-04 DIAGNOSIS — M949 Disorder of cartilage, unspecified: Secondary | ICD-10-CM | POA: Diagnosis not present

## 2012-07-04 DIAGNOSIS — M899 Disorder of bone, unspecified: Secondary | ICD-10-CM | POA: Diagnosis not present

## 2012-07-18 DIAGNOSIS — M542 Cervicalgia: Secondary | ICD-10-CM | POA: Diagnosis not present

## 2012-07-18 DIAGNOSIS — R292 Abnormal reflex: Secondary | ICD-10-CM | POA: Diagnosis not present

## 2012-07-18 DIAGNOSIS — R209 Unspecified disturbances of skin sensation: Secondary | ICD-10-CM | POA: Diagnosis not present

## 2012-07-18 DIAGNOSIS — R51 Headache: Secondary | ICD-10-CM | POA: Diagnosis not present

## 2012-07-20 DIAGNOSIS — Z87828 Personal history of other (healed) physical injury and trauma: Secondary | ICD-10-CM | POA: Diagnosis not present

## 2012-07-20 DIAGNOSIS — R51 Headache: Secondary | ICD-10-CM | POA: Diagnosis not present

## 2012-07-20 DIAGNOSIS — M47812 Spondylosis without myelopathy or radiculopathy, cervical region: Secondary | ICD-10-CM | POA: Diagnosis not present

## 2012-07-20 DIAGNOSIS — R209 Unspecified disturbances of skin sensation: Secondary | ICD-10-CM | POA: Diagnosis not present

## 2012-07-24 DIAGNOSIS — R209 Unspecified disturbances of skin sensation: Secondary | ICD-10-CM | POA: Diagnosis not present

## 2012-07-24 DIAGNOSIS — G609 Hereditary and idiopathic neuropathy, unspecified: Secondary | ICD-10-CM | POA: Diagnosis not present

## 2012-08-03 DIAGNOSIS — E889 Metabolic disorder, unspecified: Secondary | ICD-10-CM | POA: Diagnosis not present

## 2012-08-03 DIAGNOSIS — R51 Headache: Secondary | ICD-10-CM | POA: Diagnosis not present

## 2012-08-03 DIAGNOSIS — R209 Unspecified disturbances of skin sensation: Secondary | ICD-10-CM | POA: Diagnosis not present

## 2012-08-03 DIAGNOSIS — R292 Abnormal reflex: Secondary | ICD-10-CM | POA: Diagnosis not present

## 2012-08-07 ENCOUNTER — Telehealth: Payer: Self-pay | Admitting: Family Medicine

## 2012-08-07 NOTE — Telephone Encounter (Signed)
Forward 2 pages from [facility] to Dr. Nani Gasser for review on 08-07-12 ym

## 2012-09-12 DIAGNOSIS — H04129 Dry eye syndrome of unspecified lacrimal gland: Secondary | ICD-10-CM | POA: Diagnosis not present

## 2012-09-14 ENCOUNTER — Encounter: Payer: Self-pay | Admitting: Physician Assistant

## 2012-09-14 ENCOUNTER — Ambulatory Visit (INDEPENDENT_AMBULATORY_CARE_PROVIDER_SITE_OTHER): Payer: Medicare Other | Admitting: Physician Assistant

## 2012-09-14 VITALS — BP 154/84 | HR 87 | Ht 66.0 in | Wt 148.0 lb

## 2012-09-14 DIAGNOSIS — K068 Other specified disorders of gingiva and edentulous alveolar ridge: Secondary | ICD-10-CM

## 2012-09-14 DIAGNOSIS — R030 Elevated blood-pressure reading, without diagnosis of hypertension: Secondary | ICD-10-CM

## 2012-09-14 DIAGNOSIS — R1012 Left upper quadrant pain: Secondary | ICD-10-CM

## 2012-09-14 DIAGNOSIS — T148XXA Other injury of unspecified body region, initial encounter: Secondary | ICD-10-CM | POA: Diagnosis not present

## 2012-09-14 DIAGNOSIS — R791 Abnormal coagulation profile: Secondary | ICD-10-CM | POA: Diagnosis not present

## 2012-09-14 DIAGNOSIS — K055 Other periodontal diseases: Secondary | ICD-10-CM | POA: Diagnosis not present

## 2012-09-14 DIAGNOSIS — Q846 Other congenital malformations of nails: Secondary | ICD-10-CM

## 2012-09-14 NOTE — Patient Instructions (Addendum)
Will call with blood work results.

## 2012-09-14 NOTE — Progress Notes (Signed)
  Subjective:    Patient ID: Breanna Casey, female    DOB: 1956/08/28, 56 y.o.   MRN: [id number]  HPI  Patient is a 56 yo female who presents to the clinic with abnormal bleeding time, left upper to mid quadrant pain, and nail discolored.   Patient has noticed these symptoms for the last 2 weeks. She scratched a bump on her skin and she felt like she would never be able to get bleeding stopped. She also flossed her teeth the other night and noticed that her gums started to bleed. She is concerned because she was watching Dr. Neil Crouch and he showed a picture of nails of a person with liver disease and she has those nails. Her nails have the red line at the tip of nail. Her abdominal pain is episodic and really just tender to deep palpation.She reports easy bruising but not does not having any bruises today.She has not started any new medications but she is out of feverfew and not been taking for the last month. She does feel like her urine has been darker than normal and has a odor. She denies any dysuria or discharge.   Patients BP is elevated today.She is really worked up today about thinking she might have "liver disease". She denies any CP, palpitations, SOB, vision changes or headaches. She has documented White coat hypertension.     Review of Systems     Objective:   Physical Exam  Constitutional: She is oriented to person, place, and time. She appears well-developed and well-nourished.  HENT:  Head: Normocephalic and atraumatic.  Eyes: Conjunctivae normal are normal.       Negative for jaundice.  Neck: Normal range of motion. Neck supple.  Cardiovascular: Normal rate, regular rhythm and normal heart sounds.   Pulmonary/Chest: Effort normal and breath sounds normal.  Abdominal: Soft. Bowel sounds are normal. She exhibits no mass.       Tenderness to palpation over the mid-upper left quadrant. Liver appeared to be normal in size. Negative for murphys sign.  Lymphadenopathy:    She has  no cervical adenopathy.  Neurological: She is alert and oriented to person, place, and time.  Skin: Skin is warm and dry. No erythema.       Well healed scratch where original bleeding occurred. No active bleeding. No signs of bruising on exam today.  Nails- bilateral nails present with redden line at the tip of nail bed where the distal edge starts. Negative for nail pitting.  Psychiatric: She has a normal mood and affect.          Assessment & Plan:  Abnormal bleeding time/bleeding gums/Nail abnormalitity- will order CBC, PT/INR, and hepatic function test. Will call with results.   White coat hypertension- elevated today. Admits to being really worried about her diagnosis. I will continue to monitor. Discussed with patient random BP checks at pharmacy or at home.

## 2012-09-15 LAB — CBC WITH DIFFERENTIAL/PLATELET
Basophils Absolute: 0 10*3/uL (ref 0.0–0.1)
Basophils Relative: 1 % (ref 0–1)
Eosinophils Absolute: 0.1 10*3/uL (ref 0.0–0.7)
Eosinophils Relative: 2 % (ref 0–5)
HCT: 41.1 % (ref 36.0–46.0)
Hemoglobin: 14.1 g/dL (ref 12.0–15.0)
Lymphocytes Relative: 38 % (ref 12–46)
Lymphs Abs: 1.7 10*3/uL (ref 0.7–4.0)
MCH: 29 pg (ref 26.0–34.0)
MCHC: 34.3 g/dL (ref 30.0–36.0)
MCV: 84.4 fL (ref 78.0–100.0)
Monocytes Absolute: 0.4 10*3/uL (ref 0.1–1.0)
Monocytes Relative: 9 % (ref 3–12)
Neutro Abs: 2.2 10*3/uL (ref 1.7–7.7)
Neutrophils Relative %: 50 % (ref 43–77)
Platelets: 223 10*3/uL (ref 150–400)
RBC: 4.87 MIL/uL (ref 3.87–5.11)
RDW: 13 % (ref 11.5–15.5)
WBC: 4.4 10*3/uL (ref 4.0–10.5)

## 2012-09-15 LAB — HEPATIC FUNCTION PANEL
ALT: 65 U/L — ABNORMAL HIGH (ref 0–35)
AST: 63 U/L — ABNORMAL HIGH (ref 0–37)
Albumin: 4.7 g/dL (ref 3.5–5.2)
Alkaline Phosphatase: 75 U/L (ref 39–117)
Bilirubin, Direct: 0.1 mg/dL (ref 0.0–0.3)
Indirect Bilirubin: 0.4 mg/dL (ref 0.0–0.9)
Total Bilirubin: 0.5 mg/dL (ref 0.3–1.2)
Total Protein: 7.4 g/dL (ref 6.0–8.3)

## 2012-09-15 LAB — PROTIME-INR
INR: 0.99 (ref <1.50)
Prothrombin Time: 13.1 seconds (ref 11.6–15.2)

## 2012-09-18 DIAGNOSIS — M5137 Other intervertebral disc degeneration, lumbosacral region: Secondary | ICD-10-CM | POA: Diagnosis not present

## 2012-09-18 DIAGNOSIS — M999 Biomechanical lesion, unspecified: Secondary | ICD-10-CM | POA: Diagnosis not present

## 2012-09-18 DIAGNOSIS — M5030 Other cervical disc degeneration, unspecified cervical region: Secondary | ICD-10-CM | POA: Diagnosis not present

## 2012-09-18 DIAGNOSIS — M9981 Other biomechanical lesions of cervical region: Secondary | ICD-10-CM | POA: Diagnosis not present

## 2012-09-19 DIAGNOSIS — M5030 Other cervical disc degeneration, unspecified cervical region: Secondary | ICD-10-CM | POA: Diagnosis not present

## 2012-09-19 DIAGNOSIS — M5137 Other intervertebral disc degeneration, lumbosacral region: Secondary | ICD-10-CM | POA: Diagnosis not present

## 2012-09-19 DIAGNOSIS — M9981 Other biomechanical lesions of cervical region: Secondary | ICD-10-CM | POA: Diagnosis not present

## 2012-09-19 DIAGNOSIS — M999 Biomechanical lesion, unspecified: Secondary | ICD-10-CM | POA: Diagnosis not present

## 2012-09-25 DIAGNOSIS — M5137 Other intervertebral disc degeneration, lumbosacral region: Secondary | ICD-10-CM | POA: Diagnosis not present

## 2012-09-25 DIAGNOSIS — M5030 Other cervical disc degeneration, unspecified cervical region: Secondary | ICD-10-CM | POA: Diagnosis not present

## 2012-09-25 DIAGNOSIS — M999 Biomechanical lesion, unspecified: Secondary | ICD-10-CM | POA: Diagnosis not present

## 2012-09-25 DIAGNOSIS — M9981 Other biomechanical lesions of cervical region: Secondary | ICD-10-CM | POA: Diagnosis not present

## 2012-09-26 DIAGNOSIS — M5030 Other cervical disc degeneration, unspecified cervical region: Secondary | ICD-10-CM | POA: Diagnosis not present

## 2012-09-26 DIAGNOSIS — M5137 Other intervertebral disc degeneration, lumbosacral region: Secondary | ICD-10-CM | POA: Diagnosis not present

## 2012-09-26 DIAGNOSIS — M999 Biomechanical lesion, unspecified: Secondary | ICD-10-CM | POA: Diagnosis not present

## 2012-09-26 DIAGNOSIS — M9981 Other biomechanical lesions of cervical region: Secondary | ICD-10-CM | POA: Diagnosis not present

## 2012-10-01 DIAGNOSIS — M999 Biomechanical lesion, unspecified: Secondary | ICD-10-CM | POA: Diagnosis not present

## 2012-10-01 DIAGNOSIS — M9981 Other biomechanical lesions of cervical region: Secondary | ICD-10-CM | POA: Diagnosis not present

## 2012-10-01 DIAGNOSIS — M5137 Other intervertebral disc degeneration, lumbosacral region: Secondary | ICD-10-CM | POA: Diagnosis not present

## 2012-10-01 DIAGNOSIS — M5030 Other cervical disc degeneration, unspecified cervical region: Secondary | ICD-10-CM | POA: Diagnosis not present

## 2012-10-02 DIAGNOSIS — M5137 Other intervertebral disc degeneration, lumbosacral region: Secondary | ICD-10-CM | POA: Diagnosis not present

## 2012-10-02 DIAGNOSIS — M9981 Other biomechanical lesions of cervical region: Secondary | ICD-10-CM | POA: Diagnosis not present

## 2012-10-02 DIAGNOSIS — M5030 Other cervical disc degeneration, unspecified cervical region: Secondary | ICD-10-CM | POA: Diagnosis not present

## 2012-10-02 DIAGNOSIS — M999 Biomechanical lesion, unspecified: Secondary | ICD-10-CM | POA: Diagnosis not present

## 2012-10-08 DIAGNOSIS — M999 Biomechanical lesion, unspecified: Secondary | ICD-10-CM | POA: Diagnosis not present

## 2012-10-08 DIAGNOSIS — M9981 Other biomechanical lesions of cervical region: Secondary | ICD-10-CM | POA: Diagnosis not present

## 2012-10-08 DIAGNOSIS — M5030 Other cervical disc degeneration, unspecified cervical region: Secondary | ICD-10-CM | POA: Diagnosis not present

## 2012-10-08 DIAGNOSIS — M5137 Other intervertebral disc degeneration, lumbosacral region: Secondary | ICD-10-CM | POA: Diagnosis not present

## 2012-10-10 DIAGNOSIS — M9981 Other biomechanical lesions of cervical region: Secondary | ICD-10-CM | POA: Diagnosis not present

## 2012-10-10 DIAGNOSIS — M5030 Other cervical disc degeneration, unspecified cervical region: Secondary | ICD-10-CM | POA: Diagnosis not present

## 2012-10-10 DIAGNOSIS — M5137 Other intervertebral disc degeneration, lumbosacral region: Secondary | ICD-10-CM | POA: Diagnosis not present

## 2012-10-10 DIAGNOSIS — M999 Biomechanical lesion, unspecified: Secondary | ICD-10-CM | POA: Diagnosis not present

## 2012-10-15 DIAGNOSIS — M999 Biomechanical lesion, unspecified: Secondary | ICD-10-CM | POA: Diagnosis not present

## 2012-10-15 DIAGNOSIS — M5137 Other intervertebral disc degeneration, lumbosacral region: Secondary | ICD-10-CM | POA: Diagnosis not present

## 2012-10-15 DIAGNOSIS — M5030 Other cervical disc degeneration, unspecified cervical region: Secondary | ICD-10-CM | POA: Diagnosis not present

## 2012-10-15 DIAGNOSIS — M9981 Other biomechanical lesions of cervical region: Secondary | ICD-10-CM | POA: Diagnosis not present

## 2012-10-17 DIAGNOSIS — M5137 Other intervertebral disc degeneration, lumbosacral region: Secondary | ICD-10-CM | POA: Diagnosis not present

## 2012-10-17 DIAGNOSIS — M5030 Other cervical disc degeneration, unspecified cervical region: Secondary | ICD-10-CM | POA: Diagnosis not present

## 2012-10-17 DIAGNOSIS — M9981 Other biomechanical lesions of cervical region: Secondary | ICD-10-CM | POA: Diagnosis not present

## 2012-10-17 DIAGNOSIS — M999 Biomechanical lesion, unspecified: Secondary | ICD-10-CM | POA: Diagnosis not present

## 2012-10-23 DIAGNOSIS — M5137 Other intervertebral disc degeneration, lumbosacral region: Secondary | ICD-10-CM | POA: Diagnosis not present

## 2012-10-23 DIAGNOSIS — M9981 Other biomechanical lesions of cervical region: Secondary | ICD-10-CM | POA: Diagnosis not present

## 2012-10-23 DIAGNOSIS — M5030 Other cervical disc degeneration, unspecified cervical region: Secondary | ICD-10-CM | POA: Diagnosis not present

## 2012-10-23 DIAGNOSIS — M999 Biomechanical lesion, unspecified: Secondary | ICD-10-CM | POA: Diagnosis not present

## 2012-10-30 DIAGNOSIS — M9981 Other biomechanical lesions of cervical region: Secondary | ICD-10-CM | POA: Diagnosis not present

## 2012-10-30 DIAGNOSIS — M999 Biomechanical lesion, unspecified: Secondary | ICD-10-CM | POA: Diagnosis not present

## 2012-10-30 DIAGNOSIS — M5137 Other intervertebral disc degeneration, lumbosacral region: Secondary | ICD-10-CM | POA: Diagnosis not present

## 2012-10-30 DIAGNOSIS — M5030 Other cervical disc degeneration, unspecified cervical region: Secondary | ICD-10-CM | POA: Diagnosis not present

## 2012-11-07 ENCOUNTER — Telehealth: Payer: Self-pay

## 2012-11-07 DIAGNOSIS — R748 Abnormal levels of other serum enzymes: Secondary | ICD-10-CM

## 2012-11-07 NOTE — Telephone Encounter (Signed)
Breanna Casey is coming in the morning to pick up lab sheet. The lab sheet is at the front desk.

## 2012-11-08 NOTE — Telephone Encounter (Signed)
Changed to CMP

## 2012-11-14 DIAGNOSIS — R7401 Elevation of levels of liver transaminase levels: Secondary | ICD-10-CM | POA: Diagnosis not present

## 2012-11-14 DIAGNOSIS — R7402 Elevation of levels of lactic acid dehydrogenase (LDH): Secondary | ICD-10-CM | POA: Diagnosis not present

## 2012-11-15 LAB — COMPLETE METABOLIC PANEL WITH GFR
ALT: 20 U/L (ref 0–35)
AST: 24 U/L (ref 0–37)
Albumin: 4.8 g/dL (ref 3.5–5.2)
Alkaline Phosphatase: 77 U/L (ref 39–117)
BUN: 6 mg/dL (ref 6–23)
CO2: 27 mEq/L (ref 19–32)
Calcium: 9.9 mg/dL (ref 8.4–10.5)
Chloride: 102 mEq/L (ref 96–112)
Creat: 0.67 mg/dL (ref 0.50–1.10)
GFR, Est African American: >89 mL/min
GFR, Est Non African American: >89 mL/min
Glucose, Bld: 82 mg/dL (ref 70–99)
Potassium: 4.4 mEq/L (ref 3.5–5.3)
Sodium: 140 mEq/L (ref 135–145)
Total Bilirubin: 0.5 mg/dL (ref 0.3–1.2)
Total Protein: 7.1 g/dL (ref 6.0–8.3)

## 2012-11-15 NOTE — Telephone Encounter (Signed)
Quick Note:  Call patient with results. Let them know all labs are within normal limits. ______

## 2013-01-21 ENCOUNTER — Ambulatory Visit (INDEPENDENT_AMBULATORY_CARE_PROVIDER_SITE_OTHER): Payer: Medicare Other | Admitting: Physician Assistant

## 2013-01-21 ENCOUNTER — Encounter: Payer: Self-pay | Admitting: Physician Assistant

## 2013-01-21 VITALS — BP 154/87 | HR 79 | Wt 149.0 lb

## 2013-01-21 DIAGNOSIS — H9209 Otalgia, unspecified ear: Secondary | ICD-10-CM

## 2013-01-21 DIAGNOSIS — R590 Localized enlarged lymph nodes: Secondary | ICD-10-CM

## 2013-01-21 DIAGNOSIS — R599 Enlarged lymph nodes, unspecified: Secondary | ICD-10-CM

## 2013-01-21 DIAGNOSIS — M542 Cervicalgia: Secondary | ICD-10-CM

## 2013-01-21 DIAGNOSIS — J302 Other seasonal allergic rhinitis: Secondary | ICD-10-CM

## 2013-01-21 DIAGNOSIS — R062 Wheezing: Secondary | ICD-10-CM

## 2013-01-21 DIAGNOSIS — H9203 Otalgia, bilateral: Secondary | ICD-10-CM

## 2013-01-21 MED ORDER — ALBUTEROL SULFATE HFA 108 (90 BASE) MCG/ACT IN AERS: 2.0000 | INHALATION_SPRAY | Freq: Four times a day (QID) | RESPIRATORY_TRACT | Status: DC | PRN

## 2013-01-21 MED ORDER — PREDNISONE 20 MG PO TABS: 20.0000 mg | ORAL_TABLET | Freq: Two times a day (BID) | ORAL | Status: DC

## 2013-01-21 NOTE — Progress Notes (Signed)
  Subjective:    Patient ID: Breanna Casey, female    DOB: [birth year], 57 y.o.   MRN: [id number]  HPI Patient is a 57 yo female that presents to the clinic with neck pain and lump of neck. She has not been feeling well for 2 months. She has had sinus and nasal congestion but they have since resolved. Her ears feel like they are going to explode. Her neck feels like lumps are in them and tender to touch. She has had a dry cough and started to wheeze. She does not have rescue inhaler. Her temperature has been low but per pt when she does feel well it drops low. Pt has extensive allergies to medication and has not tried anything to make better or worse other than herbs and vitamins which have not helped.     Review of Systems     Objective:   Physical Exam  Constitutional: She is oriented to person, place, and time. She appears well-developed and well-nourished.  HENT:  Head: Normocephalic and atraumatic.  Right Ear: External ear normal.  Left Ear: External ear normal.  Mouth/Throat: Oropharynx is clear and moist.  TM's are clear with good light reflex. TM's do appear dry with slight retraction.   Negative for maxillary or frontal tenderness.   Turbinates red and swollen.  Eyes: Conjunctivae are normal. Right eye exhibits no discharge. Left eye exhibits no discharge.  Injected conjunctiva bilaterally.  Neck: Normal range of motion. Neck supple.  Bilateral anterior cervical adenopathy with tenderness to palpation.  Cardiovascular: Normal rate, regular rhythm and normal heart sounds.   Pulmonary/Chest: Effort normal and breath sounds normal. She has no wheezes.  Lymphadenopathy:    She has cervical adenopathy.  Neurological: She is alert and oriented to person, place, and time.  Skin: Skin is warm and dry.  Psychiatric: She has a normal mood and affect. Her behavior is normal.          Assessment & Plan:  Wheezing/Allergies/ear pain/swollen lymphnodes in neck- I did not hear  wheezing on exam today;however since pt has allergies and no identifable causes for neck and ear pain will treat with prednisone for 5 days to help treat and inflammation. Will also get CBC to look at WBC. Sent rescue inhaler to use every 6 hours as needed for SOB/wheezing. If still continuing to have symptoms by Friday call office. Also can try nasal saline.

## 2013-01-22 ENCOUNTER — Other Ambulatory Visit: Payer: Self-pay | Admitting: *Deleted

## 2013-01-22 LAB — CBC WITH DIFFERENTIAL/PLATELET
Basophils Absolute: 0 10*3/uL (ref 0.0–0.1)
Basophils Relative: 1 % (ref 0–1)
Eosinophils Absolute: 0 10*3/uL (ref 0.0–0.7)
Eosinophils Relative: 1 % (ref 0–5)
HCT: 40.6 % (ref 36.0–46.0)
Hemoglobin: 14.2 g/dL (ref 12.0–15.0)
Lymphocytes Relative: 29 % (ref 12–46)
Lymphs Abs: 1.8 10*3/uL (ref 0.7–4.0)
MCH: 29.5 pg (ref 26.0–34.0)
MCHC: 35 g/dL (ref 30.0–36.0)
MCV: 84.2 fL (ref 78.0–100.0)
Monocytes Absolute: 0.5 10*3/uL (ref 0.1–1.0)
Monocytes Relative: 8 % (ref 3–12)
Neutro Abs: 3.8 10*3/uL (ref 1.7–7.7)
Neutrophils Relative %: 61 % (ref 43–77)
Platelets: 249 10*3/uL (ref 150–400)
RBC: 4.82 MIL/uL (ref 3.87–5.11)
RDW: 13.4 % (ref 11.5–15.5)
WBC: 6.1 10*3/uL (ref 4.0–10.5)

## 2013-01-22 MED ORDER — ALBUTEROL SULFATE HFA 108 (90 BASE) MCG/ACT IN AERS: 2.0000 | INHALATION_SPRAY | Freq: Four times a day (QID) | RESPIRATORY_TRACT | Status: DC | PRN

## 2013-01-22 MED ORDER — PREDNISONE 20 MG PO TABS: 20.0000 mg | ORAL_TABLET | Freq: Two times a day (BID) | ORAL | Status: DC

## 2013-01-28 ENCOUNTER — Other Ambulatory Visit: Payer: Self-pay | Admitting: Physician Assistant

## 2013-02-11 ENCOUNTER — Other Ambulatory Visit: Payer: Self-pay | Admitting: Physician Assistant

## 2013-03-12 DIAGNOSIS — K5900 Constipation, unspecified: Secondary | ICD-10-CM | POA: Diagnosis not present

## 2013-03-12 DIAGNOSIS — R1031 Right lower quadrant pain: Secondary | ICD-10-CM | POA: Diagnosis not present

## 2013-04-26 DIAGNOSIS — Z1231 Encounter for screening mammogram for malignant neoplasm of breast: Secondary | ICD-10-CM | POA: Diagnosis not present

## 2013-05-01 DIAGNOSIS — B373 Candidiasis of vulva and vagina: Secondary | ICD-10-CM | POA: Diagnosis not present

## 2013-05-01 DIAGNOSIS — B3731 Acute candidiasis of vulva and vagina: Secondary | ICD-10-CM | POA: Diagnosis not present

## 2013-05-01 DIAGNOSIS — N952 Postmenopausal atrophic vaginitis: Secondary | ICD-10-CM | POA: Diagnosis not present

## 2013-05-01 DIAGNOSIS — R1031 Right lower quadrant pain: Secondary | ICD-10-CM | POA: Diagnosis not present

## 2013-05-21 DIAGNOSIS — R292 Abnormal reflex: Secondary | ICD-10-CM | POA: Diagnosis not present

## 2013-05-21 DIAGNOSIS — R209 Unspecified disturbances of skin sensation: Secondary | ICD-10-CM | POA: Diagnosis not present

## 2013-05-21 DIAGNOSIS — G609 Hereditary and idiopathic neuropathy, unspecified: Secondary | ICD-10-CM | POA: Diagnosis not present

## 2013-05-21 DIAGNOSIS — R51 Headache: Secondary | ICD-10-CM | POA: Diagnosis not present

## 2013-05-24 DIAGNOSIS — E889 Metabolic disorder, unspecified: Secondary | ICD-10-CM | POA: Diagnosis not present

## 2013-05-24 DIAGNOSIS — R51 Headache: Secondary | ICD-10-CM | POA: Diagnosis not present

## 2013-05-24 DIAGNOSIS — R209 Unspecified disturbances of skin sensation: Secondary | ICD-10-CM | POA: Diagnosis not present

## 2013-06-24 DIAGNOSIS — N739 Female pelvic inflammatory disease, unspecified: Secondary | ICD-10-CM | POA: Diagnosis not present

## 2013-08-07 DIAGNOSIS — M9981 Other biomechanical lesions of cervical region: Secondary | ICD-10-CM | POA: Diagnosis not present

## 2013-08-07 DIAGNOSIS — M999 Biomechanical lesion, unspecified: Secondary | ICD-10-CM | POA: Diagnosis not present

## 2013-08-07 DIAGNOSIS — M5030 Other cervical disc degeneration, unspecified cervical region: Secondary | ICD-10-CM | POA: Diagnosis not present

## 2013-08-12 DIAGNOSIS — M5030 Other cervical disc degeneration, unspecified cervical region: Secondary | ICD-10-CM | POA: Diagnosis not present

## 2013-08-12 DIAGNOSIS — M999 Biomechanical lesion, unspecified: Secondary | ICD-10-CM | POA: Diagnosis not present

## 2013-08-12 DIAGNOSIS — M9981 Other biomechanical lesions of cervical region: Secondary | ICD-10-CM | POA: Diagnosis not present

## 2013-08-14 DIAGNOSIS — M5030 Other cervical disc degeneration, unspecified cervical region: Secondary | ICD-10-CM | POA: Diagnosis not present

## 2013-08-14 DIAGNOSIS — M999 Biomechanical lesion, unspecified: Secondary | ICD-10-CM | POA: Diagnosis not present

## 2013-08-14 DIAGNOSIS — M9981 Other biomechanical lesions of cervical region: Secondary | ICD-10-CM | POA: Diagnosis not present

## 2013-08-19 DIAGNOSIS — M9981 Other biomechanical lesions of cervical region: Secondary | ICD-10-CM | POA: Diagnosis not present

## 2013-08-19 DIAGNOSIS — M5030 Other cervical disc degeneration, unspecified cervical region: Secondary | ICD-10-CM | POA: Diagnosis not present

## 2013-08-19 DIAGNOSIS — M999 Biomechanical lesion, unspecified: Secondary | ICD-10-CM | POA: Diagnosis not present

## 2013-08-21 DIAGNOSIS — M999 Biomechanical lesion, unspecified: Secondary | ICD-10-CM | POA: Diagnosis not present

## 2013-08-21 DIAGNOSIS — M9981 Other biomechanical lesions of cervical region: Secondary | ICD-10-CM | POA: Diagnosis not present

## 2013-08-21 DIAGNOSIS — M5030 Other cervical disc degeneration, unspecified cervical region: Secondary | ICD-10-CM | POA: Diagnosis not present

## 2013-08-26 DIAGNOSIS — M5030 Other cervical disc degeneration, unspecified cervical region: Secondary | ICD-10-CM | POA: Diagnosis not present

## 2013-08-26 DIAGNOSIS — M9981 Other biomechanical lesions of cervical region: Secondary | ICD-10-CM | POA: Diagnosis not present

## 2013-08-26 DIAGNOSIS — M999 Biomechanical lesion, unspecified: Secondary | ICD-10-CM | POA: Diagnosis not present

## 2013-08-28 DIAGNOSIS — M999 Biomechanical lesion, unspecified: Secondary | ICD-10-CM | POA: Diagnosis not present

## 2013-08-28 DIAGNOSIS — M9981 Other biomechanical lesions of cervical region: Secondary | ICD-10-CM | POA: Diagnosis not present

## 2013-08-28 DIAGNOSIS — M5030 Other cervical disc degeneration, unspecified cervical region: Secondary | ICD-10-CM | POA: Diagnosis not present

## 2013-09-02 DIAGNOSIS — M999 Biomechanical lesion, unspecified: Secondary | ICD-10-CM | POA: Diagnosis not present

## 2013-09-02 DIAGNOSIS — M5030 Other cervical disc degeneration, unspecified cervical region: Secondary | ICD-10-CM | POA: Diagnosis not present

## 2013-09-02 DIAGNOSIS — M9981 Other biomechanical lesions of cervical region: Secondary | ICD-10-CM | POA: Diagnosis not present

## 2013-09-09 DIAGNOSIS — M999 Biomechanical lesion, unspecified: Secondary | ICD-10-CM | POA: Diagnosis not present

## 2013-09-09 DIAGNOSIS — M5030 Other cervical disc degeneration, unspecified cervical region: Secondary | ICD-10-CM | POA: Diagnosis not present

## 2013-09-09 DIAGNOSIS — M9981 Other biomechanical lesions of cervical region: Secondary | ICD-10-CM | POA: Diagnosis not present

## 2013-09-11 DIAGNOSIS — M5030 Other cervical disc degeneration, unspecified cervical region: Secondary | ICD-10-CM | POA: Diagnosis not present

## 2013-09-11 DIAGNOSIS — M9981 Other biomechanical lesions of cervical region: Secondary | ICD-10-CM | POA: Diagnosis not present

## 2013-09-11 DIAGNOSIS — M999 Biomechanical lesion, unspecified: Secondary | ICD-10-CM | POA: Diagnosis not present

## 2013-09-16 DIAGNOSIS — M999 Biomechanical lesion, unspecified: Secondary | ICD-10-CM | POA: Diagnosis not present

## 2013-09-16 DIAGNOSIS — M9981 Other biomechanical lesions of cervical region: Secondary | ICD-10-CM | POA: Diagnosis not present

## 2013-09-16 DIAGNOSIS — M5030 Other cervical disc degeneration, unspecified cervical region: Secondary | ICD-10-CM | POA: Diagnosis not present

## 2013-09-23 DIAGNOSIS — M5030 Other cervical disc degeneration, unspecified cervical region: Secondary | ICD-10-CM | POA: Diagnosis not present

## 2013-09-23 DIAGNOSIS — M9981 Other biomechanical lesions of cervical region: Secondary | ICD-10-CM | POA: Diagnosis not present

## 2013-09-23 DIAGNOSIS — M999 Biomechanical lesion, unspecified: Secondary | ICD-10-CM | POA: Diagnosis not present

## 2013-09-24 DIAGNOSIS — R109 Unspecified abdominal pain: Secondary | ICD-10-CM | POA: Diagnosis not present

## 2013-09-24 DIAGNOSIS — R1084 Generalized abdominal pain: Secondary | ICD-10-CM | POA: Diagnosis not present

## 2013-09-30 DIAGNOSIS — M999 Biomechanical lesion, unspecified: Secondary | ICD-10-CM | POA: Diagnosis not present

## 2013-09-30 DIAGNOSIS — M9981 Other biomechanical lesions of cervical region: Secondary | ICD-10-CM | POA: Diagnosis not present

## 2013-09-30 DIAGNOSIS — M5030 Other cervical disc degeneration, unspecified cervical region: Secondary | ICD-10-CM | POA: Diagnosis not present

## 2013-10-07 DIAGNOSIS — M999 Biomechanical lesion, unspecified: Secondary | ICD-10-CM | POA: Diagnosis not present

## 2013-10-07 DIAGNOSIS — M9981 Other biomechanical lesions of cervical region: Secondary | ICD-10-CM | POA: Diagnosis not present

## 2013-10-07 DIAGNOSIS — M5030 Other cervical disc degeneration, unspecified cervical region: Secondary | ICD-10-CM | POA: Diagnosis not present

## 2013-10-09 DIAGNOSIS — M5137 Other intervertebral disc degeneration, lumbosacral region: Secondary | ICD-10-CM | POA: Diagnosis not present

## 2013-10-09 DIAGNOSIS — M9981 Other biomechanical lesions of cervical region: Secondary | ICD-10-CM | POA: Diagnosis not present

## 2013-10-09 DIAGNOSIS — M999 Biomechanical lesion, unspecified: Secondary | ICD-10-CM | POA: Diagnosis not present

## 2013-10-09 DIAGNOSIS — M5030 Other cervical disc degeneration, unspecified cervical region: Secondary | ICD-10-CM | POA: Diagnosis not present

## 2013-10-15 DIAGNOSIS — M5030 Other cervical disc degeneration, unspecified cervical region: Secondary | ICD-10-CM | POA: Diagnosis not present

## 2013-10-15 DIAGNOSIS — M9981 Other biomechanical lesions of cervical region: Secondary | ICD-10-CM | POA: Diagnosis not present

## 2013-10-15 DIAGNOSIS — M5137 Other intervertebral disc degeneration, lumbosacral region: Secondary | ICD-10-CM | POA: Diagnosis not present

## 2013-10-15 DIAGNOSIS — M999 Biomechanical lesion, unspecified: Secondary | ICD-10-CM | POA: Diagnosis not present

## 2013-10-21 ENCOUNTER — Ambulatory Visit (INDEPENDENT_AMBULATORY_CARE_PROVIDER_SITE_OTHER): Payer: Medicare Other | Admitting: Physician Assistant

## 2013-10-21 ENCOUNTER — Encounter: Payer: Self-pay | Admitting: Physician Assistant

## 2013-10-21 VITALS — BP 152/86 | HR 74 | Wt 148.0 lb

## 2013-10-21 DIAGNOSIS — R140 Abdominal distension (gaseous): Secondary | ICD-10-CM

## 2013-10-21 DIAGNOSIS — Z131 Encounter for screening for diabetes mellitus: Secondary | ICD-10-CM

## 2013-10-21 DIAGNOSIS — I10 Essential (primary) hypertension: Secondary | ICD-10-CM

## 2013-10-21 DIAGNOSIS — J45909 Unspecified asthma, uncomplicated: Secondary | ICD-10-CM

## 2013-10-21 DIAGNOSIS — B372 Candidiasis of skin and nail: Secondary | ICD-10-CM | POA: Diagnosis not present

## 2013-10-21 DIAGNOSIS — M797 Fibromyalgia: Secondary | ICD-10-CM

## 2013-10-21 DIAGNOSIS — Z13220 Encounter for screening for lipoid disorders: Secondary | ICD-10-CM

## 2013-10-21 DIAGNOSIS — Z5189 Encounter for other specified aftercare: Secondary | ICD-10-CM

## 2013-10-21 DIAGNOSIS — T7840XD Allergy, unspecified, subsequent encounter: Secondary | ICD-10-CM

## 2013-10-21 DIAGNOSIS — IMO0001 Reserved for inherently not codable concepts without codable children: Secondary | ICD-10-CM

## 2013-10-21 DIAGNOSIS — R141 Gas pain: Secondary | ICD-10-CM

## 2013-10-21 DIAGNOSIS — R109 Unspecified abdominal pain: Secondary | ICD-10-CM

## 2013-10-21 MED ORDER — FLUCONAZOLE 150 MG PO TABS: 150.0000 mg | ORAL_TABLET | Freq: Once | ORAL | Status: DC

## 2013-10-21 MED ORDER — PREDNISONE 20 MG PO TABS: 20.0000 mg | ORAL_TABLET | Freq: Two times a day (BID) | ORAL | Status: DC

## 2013-10-21 MED ORDER — KETOCONAZOLE 2 % EX CREA: 1.0000 "application " | TOPICAL_CREAM | Freq: Every day | CUTANEOUS | Status: DC

## 2013-10-21 NOTE — Patient Instructions (Addendum)
Will refer to rheumatologist for fibromyalgia.

## 2013-10-22 NOTE — Progress Notes (Signed)
  Subjective:    Patient ID: Breanna Casey, female    DOB: Oct 09, 1956, 57 y.o.   MRN: [id number]  HPI Patient presents to the clinic to discuss accolate medication that she is having problems with her insurance approving. Per pt she has been on this medication for many years and has worked to get her allergies and asthma under control. She rarely has to use albuterol inhaler. She has a history of allergies to preservatives and additives that are in generics and can only take brand name. She has tried Singulair in the past and she was not able to tolerate. She would like for me to write a letter to [employer].   She also has some red, itchy spots in the corner of her mouth of the right sider. Per pt she gets yeast that grows occasionally. In her past diflucan and cream together have worked the best. Denies any drainage or vesicles. No fever, chills, n/v/d, or pain.   Patient is a also having some abdomen discomfort and bloating. It is off and on worse in intensity. Denies any n/v. She has on and off constipation to diarrhea. Stomach has been more upset the last week or so. She is taking lots of supplements.   She request a dose of prednisone to keep on hand for potentially allergic reactions. Epi pens are too expensive. She throught her last prednisone pack away because it expired.   Pt comes in asking about a new medication for fibromyalgia called naloxone. She heard about it and wondered if I would prescribe it.    Review of Systems     Objective:   Physical Exam  Constitutional: She is oriented to person, place, and time. She appears well-developed and well-nourished.  HENT:  Head: Normocephalic.  Cardiovascular: Normal rate, regular rhythm and normal heart sounds.   Pulmonary/Chest: Effort normal and breath sounds normal. She has no wheezes.  Abdominal: Soft. Bowel sounds are normal. She exhibits no mass. There is no rebound and no guarding.  Slight discomfort over right lower  quadrant.   Neurological: She is alert and oriented to person, place, and time.  Skin: Skin is warm and dry.  Slightly rasided erythematous patches on the side of right face around lips and nose.   Psychiatric: She has a normal mood and affect. Her behavior is normal.          Assessment & Plan:  Asthma- Note written for Accolate to get insurance approval.   Yeast infection of skin- Gave one pill of diflucan as well as ketoconazole. Discussed with pt to use just cream and see if it helped first. Pt insisted that she has hx of spreading to her gut if she didn't take diflucan.   Allergic reactions- Prednisone burst pak was prescribed for use if pt has an allergy to medication.   Abominal gas/bloating discomfort- continue to use probiotics. Discussed she could have some intolerances to foods or even a gluten sensitivity. Suggest  Trying a gluten free diet to see if it helps. Make sure not constipated. Miralax recommended for constipation. Call if pain worsening. Per pt up to date colonoscopy.  Fibromyalgia- naloxone is used for opoid dependence. Discussed I did not know anything about use of this drug with fibromyalgia. Will refer to rheumatologist.   White coat hypertension- always elevated when in this office. Per pt under 140/90 when she checks at home.

## 2013-10-28 DIAGNOSIS — M5030 Other cervical disc degeneration, unspecified cervical region: Secondary | ICD-10-CM | POA: Diagnosis not present

## 2013-10-28 DIAGNOSIS — M999 Biomechanical lesion, unspecified: Secondary | ICD-10-CM | POA: Diagnosis not present

## 2013-10-28 DIAGNOSIS — M9981 Other biomechanical lesions of cervical region: Secondary | ICD-10-CM | POA: Diagnosis not present

## 2013-10-28 DIAGNOSIS — M5137 Other intervertebral disc degeneration, lumbosacral region: Secondary | ICD-10-CM | POA: Diagnosis not present

## 2013-11-05 DIAGNOSIS — M5030 Other cervical disc degeneration, unspecified cervical region: Secondary | ICD-10-CM | POA: Diagnosis not present

## 2013-11-05 DIAGNOSIS — M9981 Other biomechanical lesions of cervical region: Secondary | ICD-10-CM | POA: Diagnosis not present

## 2013-11-05 DIAGNOSIS — M5137 Other intervertebral disc degeneration, lumbosacral region: Secondary | ICD-10-CM | POA: Diagnosis not present

## 2013-11-05 DIAGNOSIS — M999 Biomechanical lesion, unspecified: Secondary | ICD-10-CM | POA: Diagnosis not present

## 2013-11-12 DIAGNOSIS — M9981 Other biomechanical lesions of cervical region: Secondary | ICD-10-CM | POA: Diagnosis not present

## 2013-11-12 DIAGNOSIS — M5030 Other cervical disc degeneration, unspecified cervical region: Secondary | ICD-10-CM | POA: Diagnosis not present

## 2013-11-12 DIAGNOSIS — IMO0001 Reserved for inherently not codable concepts without codable children: Secondary | ICD-10-CM | POA: Diagnosis not present

## 2013-11-12 DIAGNOSIS — M999 Biomechanical lesion, unspecified: Secondary | ICD-10-CM | POA: Diagnosis not present

## 2013-11-12 DIAGNOSIS — M2550 Pain in unspecified joint: Secondary | ICD-10-CM | POA: Diagnosis not present

## 2013-11-12 DIAGNOSIS — M5137 Other intervertebral disc degeneration, lumbosacral region: Secondary | ICD-10-CM | POA: Diagnosis not present

## 2013-11-25 DIAGNOSIS — M999 Biomechanical lesion, unspecified: Secondary | ICD-10-CM | POA: Diagnosis not present

## 2013-11-25 DIAGNOSIS — M5030 Other cervical disc degeneration, unspecified cervical region: Secondary | ICD-10-CM | POA: Diagnosis not present

## 2013-11-25 DIAGNOSIS — M5137 Other intervertebral disc degeneration, lumbosacral region: Secondary | ICD-10-CM | POA: Diagnosis not present

## 2013-11-25 DIAGNOSIS — M9981 Other biomechanical lesions of cervical region: Secondary | ICD-10-CM | POA: Diagnosis not present

## 2013-12-17 DIAGNOSIS — M5137 Other intervertebral disc degeneration, lumbosacral region: Secondary | ICD-10-CM | POA: Diagnosis not present

## 2013-12-17 DIAGNOSIS — M999 Biomechanical lesion, unspecified: Secondary | ICD-10-CM | POA: Diagnosis not present

## 2013-12-17 DIAGNOSIS — M5030 Other cervical disc degeneration, unspecified cervical region: Secondary | ICD-10-CM | POA: Diagnosis not present

## 2013-12-17 DIAGNOSIS — M9981 Other biomechanical lesions of cervical region: Secondary | ICD-10-CM | POA: Diagnosis not present

## 2013-12-18 DIAGNOSIS — R1084 Generalized abdominal pain: Secondary | ICD-10-CM | POA: Diagnosis not present

## 2013-12-24 DIAGNOSIS — M999 Biomechanical lesion, unspecified: Secondary | ICD-10-CM | POA: Diagnosis not present

## 2013-12-24 DIAGNOSIS — M5030 Other cervical disc degeneration, unspecified cervical region: Secondary | ICD-10-CM | POA: Diagnosis not present

## 2013-12-24 DIAGNOSIS — M5137 Other intervertebral disc degeneration, lumbosacral region: Secondary | ICD-10-CM | POA: Diagnosis not present

## 2013-12-24 DIAGNOSIS — M9981 Other biomechanical lesions of cervical region: Secondary | ICD-10-CM | POA: Diagnosis not present

## 2013-12-31 DIAGNOSIS — M25519 Pain in unspecified shoulder: Secondary | ICD-10-CM | POA: Diagnosis not present

## 2013-12-31 DIAGNOSIS — M549 Dorsalgia, unspecified: Secondary | ICD-10-CM | POA: Diagnosis not present

## 2013-12-31 DIAGNOSIS — M5030 Other cervical disc degeneration, unspecified cervical region: Secondary | ICD-10-CM | POA: Diagnosis not present

## 2013-12-31 DIAGNOSIS — IMO0001 Reserved for inherently not codable concepts without codable children: Secondary | ICD-10-CM | POA: Diagnosis not present

## 2014-01-06 DIAGNOSIS — M5030 Other cervical disc degeneration, unspecified cervical region: Secondary | ICD-10-CM | POA: Diagnosis not present

## 2014-01-06 DIAGNOSIS — M9981 Other biomechanical lesions of cervical region: Secondary | ICD-10-CM | POA: Diagnosis not present

## 2014-01-06 DIAGNOSIS — M5137 Other intervertebral disc degeneration, lumbosacral region: Secondary | ICD-10-CM | POA: Diagnosis not present

## 2014-01-06 DIAGNOSIS — M999 Biomechanical lesion, unspecified: Secondary | ICD-10-CM | POA: Diagnosis not present

## 2014-01-14 DIAGNOSIS — M999 Biomechanical lesion, unspecified: Secondary | ICD-10-CM | POA: Diagnosis not present

## 2014-01-14 DIAGNOSIS — M9981 Other biomechanical lesions of cervical region: Secondary | ICD-10-CM | POA: Diagnosis not present

## 2014-01-14 DIAGNOSIS — M5030 Other cervical disc degeneration, unspecified cervical region: Secondary | ICD-10-CM | POA: Diagnosis not present

## 2014-01-14 DIAGNOSIS — M5137 Other intervertebral disc degeneration, lumbosacral region: Secondary | ICD-10-CM | POA: Diagnosis not present

## 2014-01-22 DIAGNOSIS — M5030 Other cervical disc degeneration, unspecified cervical region: Secondary | ICD-10-CM | POA: Diagnosis not present

## 2014-01-22 DIAGNOSIS — M5137 Other intervertebral disc degeneration, lumbosacral region: Secondary | ICD-10-CM | POA: Diagnosis not present

## 2014-01-22 DIAGNOSIS — M999 Biomechanical lesion, unspecified: Secondary | ICD-10-CM | POA: Diagnosis not present

## 2014-01-22 DIAGNOSIS — M9981 Other biomechanical lesions of cervical region: Secondary | ICD-10-CM | POA: Diagnosis not present

## 2014-01-28 DIAGNOSIS — M999 Biomechanical lesion, unspecified: Secondary | ICD-10-CM | POA: Diagnosis not present

## 2014-01-28 DIAGNOSIS — M5137 Other intervertebral disc degeneration, lumbosacral region: Secondary | ICD-10-CM | POA: Diagnosis not present

## 2014-01-28 DIAGNOSIS — M9981 Other biomechanical lesions of cervical region: Secondary | ICD-10-CM | POA: Diagnosis not present

## 2014-01-28 DIAGNOSIS — M5030 Other cervical disc degeneration, unspecified cervical region: Secondary | ICD-10-CM | POA: Diagnosis not present

## 2014-01-30 DIAGNOSIS — M25559 Pain in unspecified hip: Secondary | ICD-10-CM | POA: Diagnosis not present

## 2014-01-30 DIAGNOSIS — M549 Dorsalgia, unspecified: Secondary | ICD-10-CM | POA: Diagnosis not present

## 2014-01-30 DIAGNOSIS — IMO0001 Reserved for inherently not codable concepts without codable children: Secondary | ICD-10-CM | POA: Diagnosis not present

## 2014-01-30 DIAGNOSIS — M25519 Pain in unspecified shoulder: Secondary | ICD-10-CM | POA: Diagnosis not present

## 2014-02-05 DIAGNOSIS — M999 Biomechanical lesion, unspecified: Secondary | ICD-10-CM | POA: Diagnosis not present

## 2014-02-05 DIAGNOSIS — M9981 Other biomechanical lesions of cervical region: Secondary | ICD-10-CM | POA: Diagnosis not present

## 2014-02-05 DIAGNOSIS — M5030 Other cervical disc degeneration, unspecified cervical region: Secondary | ICD-10-CM | POA: Diagnosis not present

## 2014-02-05 DIAGNOSIS — M5137 Other intervertebral disc degeneration, lumbosacral region: Secondary | ICD-10-CM | POA: Diagnosis not present

## 2014-02-28 ENCOUNTER — Encounter: Payer: Self-pay | Admitting: Physician Assistant

## 2014-02-28 ENCOUNTER — Ambulatory Visit (INDEPENDENT_AMBULATORY_CARE_PROVIDER_SITE_OTHER): Payer: Medicare Other | Admitting: Physician Assistant

## 2014-02-28 VITALS — BP 153/86 | HR 78 | Temp 97.7°F | Wt 144.0 lb

## 2014-02-28 DIAGNOSIS — IMO0001 Reserved for inherently not codable concepts without codable children: Secondary | ICD-10-CM

## 2014-02-28 DIAGNOSIS — Z131 Encounter for screening for diabetes mellitus: Secondary | ICD-10-CM

## 2014-02-28 DIAGNOSIS — Z13220 Encounter for screening for lipoid disorders: Secondary | ICD-10-CM | POA: Diagnosis not present

## 2014-02-28 DIAGNOSIS — R030 Elevated blood-pressure reading, without diagnosis of hypertension: Secondary | ICD-10-CM

## 2014-02-28 DIAGNOSIS — J029 Acute pharyngitis, unspecified: Secondary | ICD-10-CM | POA: Diagnosis not present

## 2014-02-28 DIAGNOSIS — H6980 Other specified disorders of Eustachian tube, unspecified ear: Secondary | ICD-10-CM

## 2014-02-28 MED ORDER — FLUCONAZOLE 150 MG PO TABS: 150.0000 mg | ORAL_TABLET | Freq: Once | ORAL | Status: DC

## 2014-02-28 MED ORDER — PREDNISONE 20 MG PO TABS: 20.0000 mg | ORAL_TABLET | Freq: Two times a day (BID) | ORAL | Status: DC

## 2014-02-28 MED ORDER — EPINEPHRINE 0.3 MG/0.3ML IJ SOAJ: 0.3000 mg | Freq: Once | INTRAMUSCULAR | Status: DC

## 2014-02-28 NOTE — Patient Instructions (Signed)
Sore Throat A sore throat is pain, burning, irritation, or scratchiness of the throat. There is often pain or tenderness when swallowing or talking. A sore throat may be accompanied by other symptoms, such as coughing, sneezing, fever, and swollen neck glands. A sore throat is often the first sign of another sickness, such as a cold, flu, strep throat, or mononucleosis (commonly known as mono). Most sore throats go away without medical treatment. CAUSES  The most common causes of a sore throat include:  A viral infection, such as a cold, flu, or mono.  A bacterial infection, such as strep throat, tonsillitis, or whooping cough.  Seasonal allergies.  Dryness in the air.  Irritants, such as smoke or pollution.  Gastroesophageal reflux disease (GERD). HOME CARE INSTRUCTIONS   Only take over-the-counter medicines as directed by your caregiver.  Drink enough fluids to keep your urine clear or pale yellow.  Rest as needed.  Try using throat sprays, lozenges, or sucking on hard candy to ease any pain (if older than 4 years or as directed).  Sip warm liquids, such as broth, herbal tea, or warm water with honey to relieve pain temporarily. You may also eat or drink cold or frozen liquids such as frozen ice pops.  Gargle with salt water (mix 1 tsp salt with 8 oz of water).  Do not smoke and avoid secondhand smoke.  Put a cool-mist humidifier in your bedroom at night to moisten the air. You can also turn on a hot shower and sit in the bathroom with the door closed for 5 10 minutes. SEEK IMMEDIATE MEDICAL CARE IF:  You have difficulty breathing.  You are unable to swallow fluids, soft foods, or your saliva.  You have increased swelling in the throat.  Your sore throat does not get better in 7 days.  You have nausea and vomiting.  You have a fever or persistent symptoms for more than 2 3 days.  You have a fever and your symptoms suddenly get worse. MAKE SURE YOU:   Understand  these instructions.  Will watch your condition.  Will get help right away if you are not doing well or get worse. Document Released: 12/29/2004 Document Revised: 11/07/2012 Document Reviewed: 07/29/2012 [employer] Patient Information 2014 [employer].  

## 2014-03-02 NOTE — Progress Notes (Signed)
   Subjective:    Patient ID: Breanna Casey, female    DOB: [birth year], 58 y.o.   MRN: [id number]  HPI Pt presents to the clinic with a little over a week of sore throat and ear pain. She has very little appetite as well. Denies any fever, chills, nausea, vomiting. Has a little bit of sinus pressure. Right ear is more painful than left ear. No drainage. Tried herbal remedies with little benefit.   She has a new allergy than she was given by GI doctor. She did have anaphylaxis with throat swelling and SOB. She took prednisone that she had left over and benadryl and symptoms did improve.   bP elevated- checks at home and always normal. Denies any CP, palpitations, headaches, or vision changes.    Review of Systems     Objective:   Physical Exam  Constitutional: She is oriented to person, place, and time. She appears well-developed and well-nourished.  HENT:  Head: Normocephalic and atraumatic.  Right Ear: External ear normal.  Left Ear: External ear normal.  Nose: Nose normal.  Mouth/Throat: Oropharynx is clear and moist.  TM's appear very dry.   Eyes: Conjunctivae are normal. Right eye exhibits no discharge. Left eye exhibits no discharge.  Neck: Normal range of motion. Neck supple.  Cardiovascular: Normal rate, regular rhythm and normal heart sounds.   Pulmonary/Chest: Effort normal and breath sounds normal. She has no wheezes.  Lymphadenopathy:    She has no cervical adenopathy.  Neurological: She is alert and oriented to person, place, and time.  Skin: Skin is dry.  Psychiatric: She has a normal mood and affect. Her behavior is normal.          Assessment & Plan:  Acute pharyngitis/Eustatian tube dysfunction-discussed with pt I saw no signs of infection. She has history of multiple allergies. Pt is not on anti-histamine recommended zyrtec or claritin daily. Gave steroid for 5 days.   anaphylaxis to medication- Added glycopyrrolate. Also sent epi pen to pharmacy.  Discussed pt really needs epi pen on her at all times. She has had to use epinephrine for analphyaxis with no allergy and helped with symptoms. Sent over refill for steroid for any allergic reactions.   Elevated BP- brought monitor in. BP's very well controlled in 110-120's/70-80's. Likely white coat hypertension.  Screening labs were given to pt.

## 2014-03-03 DIAGNOSIS — M5030 Other cervical disc degeneration, unspecified cervical region: Secondary | ICD-10-CM | POA: Diagnosis not present

## 2014-03-03 DIAGNOSIS — M999 Biomechanical lesion, unspecified: Secondary | ICD-10-CM | POA: Diagnosis not present

## 2014-03-03 DIAGNOSIS — M5137 Other intervertebral disc degeneration, lumbosacral region: Secondary | ICD-10-CM | POA: Diagnosis not present

## 2014-03-03 DIAGNOSIS — M9981 Other biomechanical lesions of cervical region: Secondary | ICD-10-CM | POA: Diagnosis not present

## 2014-03-11 ENCOUNTER — Other Ambulatory Visit: Payer: Self-pay | Admitting: Physician Assistant

## 2014-03-11 DIAGNOSIS — Z131 Encounter for screening for diabetes mellitus: Secondary | ICD-10-CM | POA: Diagnosis not present

## 2014-03-11 DIAGNOSIS — M9981 Other biomechanical lesions of cervical region: Secondary | ICD-10-CM | POA: Diagnosis not present

## 2014-03-11 DIAGNOSIS — M5030 Other cervical disc degeneration, unspecified cervical region: Secondary | ICD-10-CM | POA: Diagnosis not present

## 2014-03-11 DIAGNOSIS — M999 Biomechanical lesion, unspecified: Secondary | ICD-10-CM | POA: Diagnosis not present

## 2014-03-11 DIAGNOSIS — M5137 Other intervertebral disc degeneration, lumbosacral region: Secondary | ICD-10-CM | POA: Diagnosis not present

## 2014-03-11 MED ORDER — FLUCONAZOLE 150 MG PO TABS: 150.0000 mg | ORAL_TABLET | Freq: Once | ORAL | Status: DC

## 2014-03-12 LAB — COMPLETE METABOLIC PANEL WITH GFR
ALT: 20 U/L (ref 0–35)
AST: 22 U/L (ref 0–37)
Albumin: 4.4 g/dL (ref 3.5–5.2)
Alkaline Phosphatase: 75 U/L (ref 39–117)
BUN: 5 mg/dL — ABNORMAL LOW (ref 6–23)
CO2: 29 mEq/L (ref 19–32)
Calcium: 9.8 mg/dL (ref 8.4–10.5)
Chloride: 99 mEq/L (ref 96–112)
Creat: 0.63 mg/dL (ref 0.50–1.10)
GFR, Est African American: >89 mL/min
GFR, Est Non African American: >89 mL/min
Glucose, Bld: 67 mg/dL — ABNORMAL LOW (ref 70–99)
Potassium: 4 mEq/L (ref 3.5–5.3)
Sodium: 140 mEq/L (ref 135–145)
Total Bilirubin: 0.5 mg/dL (ref 0.2–1.2)
Total Protein: 6.8 g/dL (ref 6.0–8.3)

## 2014-03-19 DIAGNOSIS — M9981 Other biomechanical lesions of cervical region: Secondary | ICD-10-CM | POA: Diagnosis not present

## 2014-03-19 DIAGNOSIS — M999 Biomechanical lesion, unspecified: Secondary | ICD-10-CM | POA: Diagnosis not present

## 2014-03-19 DIAGNOSIS — M5030 Other cervical disc degeneration, unspecified cervical region: Secondary | ICD-10-CM | POA: Diagnosis not present

## 2014-03-19 DIAGNOSIS — M5137 Other intervertebral disc degeneration, lumbosacral region: Secondary | ICD-10-CM | POA: Diagnosis not present

## 2014-03-24 DIAGNOSIS — R1011 Right upper quadrant pain: Secondary | ICD-10-CM | POA: Diagnosis not present

## 2014-04-01 DIAGNOSIS — M999 Biomechanical lesion, unspecified: Secondary | ICD-10-CM | POA: Diagnosis not present

## 2014-04-01 DIAGNOSIS — M5030 Other cervical disc degeneration, unspecified cervical region: Secondary | ICD-10-CM | POA: Diagnosis not present

## 2014-04-01 DIAGNOSIS — M9981 Other biomechanical lesions of cervical region: Secondary | ICD-10-CM | POA: Diagnosis not present

## 2014-04-01 DIAGNOSIS — M5137 Other intervertebral disc degeneration, lumbosacral region: Secondary | ICD-10-CM | POA: Diagnosis not present

## 2014-04-03 ENCOUNTER — Encounter: Payer: Self-pay | Admitting: Family Medicine

## 2014-04-15 DIAGNOSIS — M999 Biomechanical lesion, unspecified: Secondary | ICD-10-CM | POA: Diagnosis not present

## 2014-04-15 DIAGNOSIS — M5030 Other cervical disc degeneration, unspecified cervical region: Secondary | ICD-10-CM | POA: Diagnosis not present

## 2014-04-15 DIAGNOSIS — M5137 Other intervertebral disc degeneration, lumbosacral region: Secondary | ICD-10-CM | POA: Diagnosis not present

## 2014-04-15 DIAGNOSIS — M9981 Other biomechanical lesions of cervical region: Secondary | ICD-10-CM | POA: Diagnosis not present

## 2014-04-23 DIAGNOSIS — M5137 Other intervertebral disc degeneration, lumbosacral region: Secondary | ICD-10-CM | POA: Diagnosis not present

## 2014-04-23 DIAGNOSIS — M9981 Other biomechanical lesions of cervical region: Secondary | ICD-10-CM | POA: Diagnosis not present

## 2014-04-23 DIAGNOSIS — M999 Biomechanical lesion, unspecified: Secondary | ICD-10-CM | POA: Diagnosis not present

## 2014-04-23 DIAGNOSIS — M5030 Other cervical disc degeneration, unspecified cervical region: Secondary | ICD-10-CM | POA: Diagnosis not present

## 2014-05-02 DIAGNOSIS — Z1231 Encounter for screening mammogram for malignant neoplasm of breast: Secondary | ICD-10-CM | POA: Diagnosis not present

## 2014-05-07 DIAGNOSIS — M810 Age-related osteoporosis without current pathological fracture: Secondary | ICD-10-CM | POA: Diagnosis not present

## 2014-05-07 DIAGNOSIS — R109 Unspecified abdominal pain: Secondary | ICD-10-CM | POA: Diagnosis not present

## 2014-05-07 DIAGNOSIS — B3731 Acute candidiasis of vulva and vagina: Secondary | ICD-10-CM | POA: Diagnosis not present

## 2014-05-07 DIAGNOSIS — R1031 Right lower quadrant pain: Secondary | ICD-10-CM | POA: Diagnosis not present

## 2014-05-07 DIAGNOSIS — B373 Candidiasis of vulva and vagina: Secondary | ICD-10-CM | POA: Diagnosis not present

## 2014-05-16 ENCOUNTER — Encounter: Payer: Self-pay | Admitting: Physician Assistant

## 2014-05-20 DIAGNOSIS — M9981 Other biomechanical lesions of cervical region: Secondary | ICD-10-CM | POA: Diagnosis not present

## 2014-05-20 DIAGNOSIS — M5030 Other cervical disc degeneration, unspecified cervical region: Secondary | ICD-10-CM | POA: Diagnosis not present

## 2014-05-20 DIAGNOSIS — M5137 Other intervertebral disc degeneration, lumbosacral region: Secondary | ICD-10-CM | POA: Diagnosis not present

## 2014-05-20 DIAGNOSIS — M999 Biomechanical lesion, unspecified: Secondary | ICD-10-CM | POA: Diagnosis not present

## 2014-05-26 ENCOUNTER — Encounter: Payer: Self-pay | Admitting: Physician Assistant

## 2014-05-28 DIAGNOSIS — M5030 Other cervical disc degeneration, unspecified cervical region: Secondary | ICD-10-CM | POA: Diagnosis not present

## 2014-05-28 DIAGNOSIS — M9981 Other biomechanical lesions of cervical region: Secondary | ICD-10-CM | POA: Diagnosis not present

## 2014-05-28 DIAGNOSIS — M999 Biomechanical lesion, unspecified: Secondary | ICD-10-CM | POA: Diagnosis not present

## 2014-05-28 DIAGNOSIS — M5137 Other intervertebral disc degeneration, lumbosacral region: Secondary | ICD-10-CM | POA: Diagnosis not present

## 2014-06-02 ENCOUNTER — Encounter: Payer: Self-pay | Admitting: Physician Assistant

## 2014-06-10 DIAGNOSIS — M5137 Other intervertebral disc degeneration, lumbosacral region: Secondary | ICD-10-CM | POA: Diagnosis not present

## 2014-06-10 DIAGNOSIS — M9981 Other biomechanical lesions of cervical region: Secondary | ICD-10-CM | POA: Diagnosis not present

## 2014-06-10 DIAGNOSIS — M5030 Other cervical disc degeneration, unspecified cervical region: Secondary | ICD-10-CM | POA: Diagnosis not present

## 2014-06-10 DIAGNOSIS — M999 Biomechanical lesion, unspecified: Secondary | ICD-10-CM | POA: Diagnosis not present

## 2014-06-17 DIAGNOSIS — M5030 Other cervical disc degeneration, unspecified cervical region: Secondary | ICD-10-CM | POA: Diagnosis not present

## 2014-06-17 DIAGNOSIS — M9981 Other biomechanical lesions of cervical region: Secondary | ICD-10-CM | POA: Diagnosis not present

## 2014-06-17 DIAGNOSIS — M5137 Other intervertebral disc degeneration, lumbosacral region: Secondary | ICD-10-CM | POA: Diagnosis not present

## 2014-06-17 DIAGNOSIS — M999 Biomechanical lesion, unspecified: Secondary | ICD-10-CM | POA: Diagnosis not present

## 2014-06-24 DIAGNOSIS — M5137 Other intervertebral disc degeneration, lumbosacral region: Secondary | ICD-10-CM | POA: Diagnosis not present

## 2014-06-24 DIAGNOSIS — M5030 Other cervical disc degeneration, unspecified cervical region: Secondary | ICD-10-CM | POA: Diagnosis not present

## 2014-06-24 DIAGNOSIS — M9981 Other biomechanical lesions of cervical region: Secondary | ICD-10-CM | POA: Diagnosis not present

## 2014-06-24 DIAGNOSIS — M999 Biomechanical lesion, unspecified: Secondary | ICD-10-CM | POA: Diagnosis not present

## 2014-07-07 DIAGNOSIS — M5030 Other cervical disc degeneration, unspecified cervical region: Secondary | ICD-10-CM | POA: Diagnosis not present

## 2014-07-07 DIAGNOSIS — M9981 Other biomechanical lesions of cervical region: Secondary | ICD-10-CM | POA: Diagnosis not present

## 2014-07-07 DIAGNOSIS — M5137 Other intervertebral disc degeneration, lumbosacral region: Secondary | ICD-10-CM | POA: Diagnosis not present

## 2014-07-07 DIAGNOSIS — M999 Biomechanical lesion, unspecified: Secondary | ICD-10-CM | POA: Diagnosis not present

## 2014-07-09 DIAGNOSIS — M5137 Other intervertebral disc degeneration, lumbosacral region: Secondary | ICD-10-CM | POA: Diagnosis not present

## 2014-07-09 DIAGNOSIS — M5030 Other cervical disc degeneration, unspecified cervical region: Secondary | ICD-10-CM | POA: Diagnosis not present

## 2014-07-09 DIAGNOSIS — M999 Biomechanical lesion, unspecified: Secondary | ICD-10-CM | POA: Diagnosis not present

## 2014-07-09 DIAGNOSIS — M9981 Other biomechanical lesions of cervical region: Secondary | ICD-10-CM | POA: Diagnosis not present

## 2014-07-22 DIAGNOSIS — M5030 Other cervical disc degeneration, unspecified cervical region: Secondary | ICD-10-CM | POA: Diagnosis not present

## 2014-07-22 DIAGNOSIS — M999 Biomechanical lesion, unspecified: Secondary | ICD-10-CM | POA: Diagnosis not present

## 2014-07-22 DIAGNOSIS — M5137 Other intervertebral disc degeneration, lumbosacral region: Secondary | ICD-10-CM | POA: Diagnosis not present

## 2014-07-22 DIAGNOSIS — M9981 Other biomechanical lesions of cervical region: Secondary | ICD-10-CM | POA: Diagnosis not present

## 2014-07-29 DIAGNOSIS — M5030 Other cervical disc degeneration, unspecified cervical region: Secondary | ICD-10-CM | POA: Diagnosis not present

## 2014-07-29 DIAGNOSIS — M999 Biomechanical lesion, unspecified: Secondary | ICD-10-CM | POA: Diagnosis not present

## 2014-07-29 DIAGNOSIS — M9981 Other biomechanical lesions of cervical region: Secondary | ICD-10-CM | POA: Diagnosis not present

## 2014-07-29 DIAGNOSIS — M5137 Other intervertebral disc degeneration, lumbosacral region: Secondary | ICD-10-CM | POA: Diagnosis not present

## 2014-08-05 DIAGNOSIS — M5137 Other intervertebral disc degeneration, lumbosacral region: Secondary | ICD-10-CM | POA: Diagnosis not present

## 2014-08-05 DIAGNOSIS — M9981 Other biomechanical lesions of cervical region: Secondary | ICD-10-CM | POA: Diagnosis not present

## 2014-08-05 DIAGNOSIS — M999 Biomechanical lesion, unspecified: Secondary | ICD-10-CM | POA: Diagnosis not present

## 2014-08-05 DIAGNOSIS — M5030 Other cervical disc degeneration, unspecified cervical region: Secondary | ICD-10-CM | POA: Diagnosis not present

## 2014-08-07 DIAGNOSIS — M5030 Other cervical disc degeneration, unspecified cervical region: Secondary | ICD-10-CM | POA: Diagnosis not present

## 2014-08-07 DIAGNOSIS — IMO0001 Reserved for inherently not codable concepts without codable children: Secondary | ICD-10-CM | POA: Diagnosis not present

## 2014-08-07 DIAGNOSIS — M546 Pain in thoracic spine: Secondary | ICD-10-CM | POA: Diagnosis not present

## 2014-08-07 DIAGNOSIS — M2550 Pain in unspecified joint: Secondary | ICD-10-CM | POA: Diagnosis not present

## 2014-08-27 DIAGNOSIS — IMO0002 Reserved for concepts with insufficient information to code with codable children: Secondary | ICD-10-CM | POA: Diagnosis not present

## 2014-08-27 DIAGNOSIS — M5412 Radiculopathy, cervical region: Secondary | ICD-10-CM | POA: Diagnosis not present

## 2014-08-27 DIAGNOSIS — M9981 Other biomechanical lesions of cervical region: Secondary | ICD-10-CM | POA: Diagnosis not present

## 2014-08-27 DIAGNOSIS — M999 Biomechanical lesion, unspecified: Secondary | ICD-10-CM | POA: Diagnosis not present

## 2014-08-29 DIAGNOSIS — M5412 Radiculopathy, cervical region: Secondary | ICD-10-CM | POA: Diagnosis not present

## 2014-08-29 DIAGNOSIS — IMO0002 Reserved for concepts with insufficient information to code with codable children: Secondary | ICD-10-CM | POA: Diagnosis not present

## 2014-08-29 DIAGNOSIS — M999 Biomechanical lesion, unspecified: Secondary | ICD-10-CM | POA: Diagnosis not present

## 2014-08-29 DIAGNOSIS — M9981 Other biomechanical lesions of cervical region: Secondary | ICD-10-CM | POA: Diagnosis not present

## 2014-08-30 ENCOUNTER — Encounter: Payer: Self-pay | Admitting: Physician Assistant

## 2014-09-01 ENCOUNTER — Ambulatory Visit (INDEPENDENT_AMBULATORY_CARE_PROVIDER_SITE_OTHER): Payer: Medicare Other | Admitting: Family Medicine

## 2014-09-01 ENCOUNTER — Encounter: Payer: Self-pay | Admitting: Family Medicine

## 2014-09-01 VITALS — BP 167/95 | HR 83 | Temp 98.2°F | Wt 138.0 lb

## 2014-09-01 DIAGNOSIS — M999 Biomechanical lesion, unspecified: Secondary | ICD-10-CM | POA: Diagnosis not present

## 2014-09-01 DIAGNOSIS — H1130 Conjunctival hemorrhage, unspecified eye: Secondary | ICD-10-CM

## 2014-09-01 DIAGNOSIS — J069 Acute upper respiratory infection, unspecified: Secondary | ICD-10-CM

## 2014-09-01 DIAGNOSIS — M9981 Other biomechanical lesions of cervical region: Secondary | ICD-10-CM | POA: Diagnosis not present

## 2014-09-01 DIAGNOSIS — IMO0002 Reserved for concepts with insufficient information to code with codable children: Secondary | ICD-10-CM | POA: Diagnosis not present

## 2014-09-01 DIAGNOSIS — M5412 Radiculopathy, cervical region: Secondary | ICD-10-CM | POA: Diagnosis not present

## 2014-09-01 DIAGNOSIS — H1131 Conjunctival hemorrhage, right eye: Secondary | ICD-10-CM

## 2014-09-01 NOTE — Progress Notes (Signed)
   Subjective:    Patient ID: Breanna Casey, female    DOB: 1956/04/17, 58 y.o.   MRN: [id number]  Conjunctivitis  Associated symptoms include ear pain.  Sore Throat  Associated symptoms include ear pain.  Otalgia    ST gt worse on Thrusday night ( 5 days ago). Then her right ear started hurting. Feels like pressure.  Then 2 days ago noticed her eye was red.  No pain or tiching or soreness.  No fever, or chills.  No cough meds. No tylenol or IBU. + cough and sneezing started today.     Review of Systems  HENT: Positive for ear pain.        Objective:   Physical Exam  Constitutional: She is oriented to person, place, and time. She appears well-developed and well-nourished.  HENT:  Head: Normocephalic and atraumatic.  Right Ear: External ear normal.  Left Ear: External ear normal.  Nose: Nose normal.  Mouth/Throat: Oropharynx is clear and moist.  TMs and canals are clear. Blood  Over the white of her right eye. No lid edema.  EOMi, PEERLA. No discharge in the eye.    Eyes: Conjunctivae and EOM are normal. Pupils are equal, round, and reactive to light.  Neck: Neck supple. No thyromegaly present.  Cardiovascular: Normal rate, regular rhythm and normal heart sounds.   Pulmonary/Chest: Effort normal and breath sounds normal. She has no wheezes.  Lymphadenopathy:    She has no cervical adenopathy.  Neurological: She is alert and oriented to person, place, and time.  Skin: Skin is warm and dry.  Psychiatric: She has a normal mood and affect.          Assessment & Plan:  Subconjunctival hemorrhage. Gave reassurance. Call if she suddenly developed eye pain or vision change or she feels like it's getting worse. It actually looks like it's starting to heal on its own.  Upper respiratory infection-given handout on some over-the-counter cold medications that are actually help to reduce symptoms such as zinc et Ronney Asters. Recommendations as avoid decongestants as they can raise  blood pressure.

## 2014-09-01 NOTE — Patient Instructions (Signed)
Upper Respiratory Infection, Adult An upper respiratory infection (URI) is also sometimes known as the common cold. The upper respiratory tract includes the nose, sinuses, throat, trachea, and bronchi. Bronchi are the airways leading to the lungs. Most people improve within 1 week, but symptoms can last up to 2 weeks. A residual cough may last even longer.  CAUSES Many different viruses can infect the tissues lining the upper respiratory tract. The tissues become irritated and inflamed and often become very moist. Mucus production is also common. A cold is contagious. You can easily spread the virus to others by oral contact. This includes kissing, sharing a glass, coughing, or sneezing. Touching your mouth or nose and then touching a surface, which is then touched by another person, can also spread the virus. SYMPTOMS  Symptoms typically develop 1 to 3 days after you come in contact with a cold virus. Symptoms vary from person to person. They may include:  Runny nose.  Sneezing.  Nasal congestion.  Sinus irritation.  Sore throat.  Loss of voice (laryngitis).  Cough.  Fatigue.  Muscle aches.  Loss of appetite.  Headache.  Low-grade fever. DIAGNOSIS  You might diagnose your own cold based on familiar symptoms, since most people get a cold 2 to 3 times a year. Your caregiver can confirm this based on your exam. Most importantly, your caregiver can check that your symptoms are not due to another disease such as strep throat, sinusitis, pneumonia, asthma, or epiglottitis. Blood tests, throat tests, and X-rays are not necessary to diagnose a common cold, but they may sometimes be helpful in excluding other more serious diseases. Your caregiver will decide if any further tests are required. RISKS AND COMPLICATIONS  You may be at risk for a more severe case of the common cold if you smoke cigarettes, have chronic heart disease (such as heart failure) or lung disease (such as asthma), or if  you have a weakened immune system. The very young and very old are also at risk for more serious infections. Bacterial sinusitis, middle ear infections, and bacterial pneumonia can complicate the common cold. The common cold can worsen asthma and chronic obstructive pulmonary disease (COPD). Sometimes, these complications can require emergency medical care and may be life-threatening. PREVENTION  The best way to protect against getting a cold is to practice good hygiene. Avoid oral or hand contact with people with cold symptoms. Wash your hands often if contact occurs. There is no clear evidence that vitamin C, vitamin E, echinacea, or exercise reduces the chance of developing a cold. However, it is always recommended to get plenty of rest and practice good nutrition. TREATMENT  Treatment is directed at relieving symptoms. There is no cure. Antibiotics are not effective, because the infection is caused by a virus, not by bacteria. Treatment may include:  Increased fluid intake. Sports drinks offer valuable electrolytes, sugars, and fluids.  Breathing heated mist or steam (vaporizer or shower).  Eating chicken soup or other clear broths, and maintaining good nutrition.  Getting plenty of rest.  Using gargles or lozenges for comfort.  Controlling fevers with ibuprofen or acetaminophen as directed by your caregiver.  Increasing usage of your inhaler if you have asthma. Zinc gel and zinc lozenges, taken in the first 24 hours of the common cold, can shorten the duration and lessen the severity of symptoms. Pain medicines may help with fever, muscle aches, and throat pain. A variety of non-prescription medicines are available to treat congestion and runny nose. Your caregiver   can make recommendations and may suggest nasal or lung inhalers for other symptoms.  HOME CARE INSTRUCTIONS   Only take over-the-counter or prescription medicines for pain, discomfort, or fever as directed by your  caregiver.  Use a warm mist humidifier or inhale steam from a shower to increase air moisture. This may keep secretions moist and make it easier to breathe.  Drink enough water and fluids to keep your urine clear or pale yellow.  Rest as needed.  Return to work when your temperature has returned to normal or as your caregiver advises. You may need to stay home longer to avoid infecting others. You can also use a face mask and careful hand washing to prevent spread of the virus. SEEK MEDICAL CARE IF:   After the first few days, you feel you are getting worse rather than better.  You need your caregiver's advice about medicines to control symptoms.  You develop chills, worsening shortness of breath, or brown or red sputum. These may be signs of pneumonia.  You develop yellow or brown nasal discharge or pain in the face, especially when you bend forward. These may be signs of sinusitis.  You develop a fever, swollen neck glands, pain with swallowing, or white areas in the back of your throat. These may be signs of strep throat. SEEK IMMEDIATE MEDICAL CARE IF:   You have a fever.  You develop severe or persistent headache, ear pain, sinus pain, or chest pain.  You develop wheezing, a prolonged cough, cough up blood, or have a change in your usual mucus (if you have chronic lung disease).  You develop sore muscles or a stiff neck. Document Released: 05/17/2001 Document Revised: 02/13/2012 Document Reviewed: 02/26/2014 [employer] Patient Information 2015 [employer]. This information is not intended to replace advice given to you by your health care provider. Make sure you discuss any questions you have with your health care provider.  

## 2014-09-03 DIAGNOSIS — M999 Biomechanical lesion, unspecified: Secondary | ICD-10-CM | POA: Diagnosis not present

## 2014-09-03 DIAGNOSIS — M9981 Other biomechanical lesions of cervical region: Secondary | ICD-10-CM | POA: Diagnosis not present

## 2014-09-03 DIAGNOSIS — IMO0002 Reserved for concepts with insufficient information to code with codable children: Secondary | ICD-10-CM | POA: Diagnosis not present

## 2014-09-03 DIAGNOSIS — M5412 Radiculopathy, cervical region: Secondary | ICD-10-CM | POA: Diagnosis not present

## 2014-09-04 DIAGNOSIS — M5414 Radiculopathy, thoracic region: Secondary | ICD-10-CM | POA: Diagnosis not present

## 2014-09-04 DIAGNOSIS — M9901 Segmental and somatic dysfunction of cervical region: Secondary | ICD-10-CM | POA: Diagnosis not present

## 2014-09-04 DIAGNOSIS — M9902 Segmental and somatic dysfunction of thoracic region: Secondary | ICD-10-CM | POA: Diagnosis not present

## 2014-09-04 DIAGNOSIS — M4722 Other spondylosis with radiculopathy, cervical region: Secondary | ICD-10-CM | POA: Diagnosis not present

## 2014-09-08 DIAGNOSIS — M9902 Segmental and somatic dysfunction of thoracic region: Secondary | ICD-10-CM | POA: Diagnosis not present

## 2014-09-08 DIAGNOSIS — M9901 Segmental and somatic dysfunction of cervical region: Secondary | ICD-10-CM | POA: Diagnosis not present

## 2014-09-08 DIAGNOSIS — M4722 Other spondylosis with radiculopathy, cervical region: Secondary | ICD-10-CM | POA: Diagnosis not present

## 2014-09-08 DIAGNOSIS — M5414 Radiculopathy, thoracic region: Secondary | ICD-10-CM | POA: Diagnosis not present

## 2014-09-09 DIAGNOSIS — M5414 Radiculopathy, thoracic region: Secondary | ICD-10-CM | POA: Diagnosis not present

## 2014-09-09 DIAGNOSIS — M4722 Other spondylosis with radiculopathy, cervical region: Secondary | ICD-10-CM | POA: Diagnosis not present

## 2014-09-09 DIAGNOSIS — M9901 Segmental and somatic dysfunction of cervical region: Secondary | ICD-10-CM | POA: Diagnosis not present

## 2014-09-09 DIAGNOSIS — M9902 Segmental and somatic dysfunction of thoracic region: Secondary | ICD-10-CM | POA: Diagnosis not present

## 2014-09-11 ENCOUNTER — Telehealth: Payer: Self-pay | Admitting: Cardiology

## 2014-09-11 ENCOUNTER — Telehealth: Payer: Self-pay | Admitting: Family Medicine

## 2014-09-11 DIAGNOSIS — Z91040 Latex allergy status: Secondary | ICD-10-CM | POA: Diagnosis not present

## 2014-09-11 DIAGNOSIS — K589 Irritable bowel syndrome without diarrhea: Secondary | ICD-10-CM | POA: Diagnosis not present

## 2014-09-11 DIAGNOSIS — Z885 Allergy status to narcotic agent status: Secondary | ICD-10-CM | POA: Diagnosis not present

## 2014-09-11 DIAGNOSIS — Z888 Allergy status to other drugs, medicaments and biological substances status: Secondary | ICD-10-CM | POA: Diagnosis not present

## 2014-09-11 DIAGNOSIS — Z91012 Allergy to eggs: Secondary | ICD-10-CM | POA: Diagnosis not present

## 2014-09-11 DIAGNOSIS — R079 Chest pain, unspecified: Secondary | ICD-10-CM | POA: Diagnosis not present

## 2014-09-11 DIAGNOSIS — M797 Fibromyalgia: Secondary | ICD-10-CM | POA: Diagnosis not present

## 2014-09-11 DIAGNOSIS — J45909 Unspecified asthma, uncomplicated: Secondary | ICD-10-CM | POA: Diagnosis not present

## 2014-09-11 DIAGNOSIS — Z886 Allergy status to analgesic agent status: Secondary | ICD-10-CM | POA: Diagnosis not present

## 2014-09-11 DIAGNOSIS — R072 Precordial pain: Secondary | ICD-10-CM | POA: Diagnosis not present

## 2014-09-11 DIAGNOSIS — Z9109 Other allergy status, other than to drugs and biological substances: Secondary | ICD-10-CM | POA: Diagnosis not present

## 2014-09-11 DIAGNOSIS — Z79899 Other long term (current) drug therapy: Secondary | ICD-10-CM | POA: Diagnosis not present

## 2014-09-11 DIAGNOSIS — Z880 Allergy status to penicillin: Secondary | ICD-10-CM | POA: Diagnosis not present

## 2014-09-11 DIAGNOSIS — R05 Cough: Secondary | ICD-10-CM | POA: Diagnosis not present

## 2014-09-11 DIAGNOSIS — Z883 Allergy status to other anti-infective agents status: Secondary | ICD-10-CM | POA: Diagnosis not present

## 2014-09-11 DIAGNOSIS — Z882 Allergy status to sulfonamides status: Secondary | ICD-10-CM | POA: Diagnosis not present

## 2014-09-11 NOTE — Telephone Encounter (Signed)
Pt. Informed to be here in the am for a 8:00 appt. With Dr. Martinique

## 2014-09-11 NOTE — Telephone Encounter (Signed)
Breanna Casey called. She was seen at ED yesterday and they  believe she has a blockage and needs to be seen by her Cardiologist today. She called [name] and her appt is scheduled for October 20th. She wants a referral to be seen somewhere else if she can get in sooner.  Thank you.

## 2014-09-11 NOTE — Telephone Encounter (Signed)
Breanna Casey has been scheduled to be seen by [facility] tomorrow at 8 am. She was told that if she was unable to keep this appointment or experienced any SOB or CP before appt she is to go to the nearest ER. Margette Fast, CMA

## 2014-09-12 ENCOUNTER — Encounter: Payer: Self-pay | Admitting: Cardiology

## 2014-09-12 ENCOUNTER — Ambulatory Visit (INDEPENDENT_AMBULATORY_CARE_PROVIDER_SITE_OTHER): Payer: Medicare Other | Admitting: Cardiology

## 2014-09-12 VITALS — BP 140/80 | HR 61 | Ht 65.0 in | Wt 137.6 lb

## 2014-09-12 DIAGNOSIS — R0789 Other chest pain: Secondary | ICD-10-CM | POA: Insufficient documentation

## 2014-09-12 DIAGNOSIS — E785 Hyperlipidemia, unspecified: Secondary | ICD-10-CM | POA: Insufficient documentation

## 2014-09-12 NOTE — Patient Instructions (Signed)
We will schedule you for a stress Echo.
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
## 2014-09-15 DIAGNOSIS — M9902 Segmental and somatic dysfunction of thoracic region: Secondary | ICD-10-CM | POA: Diagnosis not present

## 2014-09-15 DIAGNOSIS — M5414 Radiculopathy, thoracic region: Secondary | ICD-10-CM | POA: Diagnosis not present

## 2014-09-15 DIAGNOSIS — M4722 Other spondylosis with radiculopathy, cervical region: Secondary | ICD-10-CM | POA: Diagnosis not present

## 2014-09-15 DIAGNOSIS — M9901 Segmental and somatic dysfunction of cervical region: Secondary | ICD-10-CM | POA: Diagnosis not present

## 2014-09-17 DIAGNOSIS — M4722 Other spondylosis with radiculopathy, cervical region: Secondary | ICD-10-CM | POA: Diagnosis not present

## 2014-09-17 DIAGNOSIS — M5414 Radiculopathy, thoracic region: Secondary | ICD-10-CM | POA: Diagnosis not present

## 2014-09-17 DIAGNOSIS — M9902 Segmental and somatic dysfunction of thoracic region: Secondary | ICD-10-CM | POA: Diagnosis not present

## 2014-09-17 DIAGNOSIS — M9901 Segmental and somatic dysfunction of cervical region: Secondary | ICD-10-CM | POA: Diagnosis not present

## 2014-09-18 DIAGNOSIS — M9901 Segmental and somatic dysfunction of cervical region: Secondary | ICD-10-CM | POA: Diagnosis not present

## 2014-09-18 DIAGNOSIS — M5414 Radiculopathy, thoracic region: Secondary | ICD-10-CM | POA: Diagnosis not present

## 2014-09-18 DIAGNOSIS — M4722 Other spondylosis with radiculopathy, cervical region: Secondary | ICD-10-CM | POA: Diagnosis not present

## 2014-09-18 DIAGNOSIS — M9902 Segmental and somatic dysfunction of thoracic region: Secondary | ICD-10-CM | POA: Diagnosis not present

## 2014-09-19 ENCOUNTER — Encounter: Payer: Self-pay | Admitting: Cardiology

## 2014-09-22 DIAGNOSIS — M9902 Segmental and somatic dysfunction of thoracic region: Secondary | ICD-10-CM | POA: Diagnosis not present

## 2014-09-22 DIAGNOSIS — M5414 Radiculopathy, thoracic region: Secondary | ICD-10-CM | POA: Diagnosis not present

## 2014-09-22 DIAGNOSIS — M9901 Segmental and somatic dysfunction of cervical region: Secondary | ICD-10-CM | POA: Diagnosis not present

## 2014-09-22 DIAGNOSIS — M4722 Other spondylosis with radiculopathy, cervical region: Secondary | ICD-10-CM | POA: Diagnosis not present

## 2014-09-23 ENCOUNTER — Ambulatory Visit: Payer: Medicare Other | Admitting: Physician Assistant

## 2014-09-24 ENCOUNTER — Other Ambulatory Visit (HOSPITAL_COMMUNITY): Payer: Self-pay | Admitting: Cardiology

## 2014-09-24 DIAGNOSIS — M5414 Radiculopathy, thoracic region: Secondary | ICD-10-CM | POA: Diagnosis not present

## 2014-09-24 DIAGNOSIS — R0789 Other chest pain: Secondary | ICD-10-CM

## 2014-09-24 DIAGNOSIS — M4722 Other spondylosis with radiculopathy, cervical region: Secondary | ICD-10-CM | POA: Diagnosis not present

## 2014-09-24 DIAGNOSIS — E785 Hyperlipidemia, unspecified: Secondary | ICD-10-CM

## 2014-09-24 DIAGNOSIS — M9901 Segmental and somatic dysfunction of cervical region: Secondary | ICD-10-CM | POA: Diagnosis not present

## 2014-09-24 DIAGNOSIS — M9902 Segmental and somatic dysfunction of thoracic region: Secondary | ICD-10-CM | POA: Diagnosis not present

## 2014-09-25 ENCOUNTER — Ambulatory Visit (HOSPITAL_COMMUNITY): Payer: Medicare Other | Attending: Cardiology | Admitting: Radiology

## 2014-09-25 DIAGNOSIS — E785 Hyperlipidemia, unspecified: Secondary | ICD-10-CM | POA: Diagnosis not present

## 2014-09-25 DIAGNOSIS — R0789 Other chest pain: Secondary | ICD-10-CM | POA: Diagnosis not present

## 2014-09-25 NOTE — Progress Notes (Signed)
Stress Echocardiogram performed.  

## 2014-09-26 ENCOUNTER — Telehealth: Payer: Self-pay | Admitting: Cardiology

## 2014-09-26 DIAGNOSIS — M9901 Segmental and somatic dysfunction of cervical region: Secondary | ICD-10-CM | POA: Diagnosis not present

## 2014-09-26 DIAGNOSIS — M9902 Segmental and somatic dysfunction of thoracic region: Secondary | ICD-10-CM | POA: Diagnosis not present

## 2014-09-26 DIAGNOSIS — M5414 Radiculopathy, thoracic region: Secondary | ICD-10-CM | POA: Diagnosis not present

## 2014-09-26 DIAGNOSIS — M4722 Other spondylosis with radiculopathy, cervical region: Secondary | ICD-10-CM | POA: Diagnosis not present

## 2014-09-26 NOTE — Telephone Encounter (Signed)
New message     Want stress test results----please call on monday

## 2014-09-26 NOTE — Telephone Encounter (Signed)
Returned call to patient echo stress test results given. 

## 2014-09-30 DIAGNOSIS — M5414 Radiculopathy, thoracic region: Secondary | ICD-10-CM | POA: Diagnosis not present

## 2014-09-30 DIAGNOSIS — M9902 Segmental and somatic dysfunction of thoracic region: Secondary | ICD-10-CM | POA: Diagnosis not present

## 2014-09-30 DIAGNOSIS — M9901 Segmental and somatic dysfunction of cervical region: Secondary | ICD-10-CM | POA: Diagnosis not present

## 2014-09-30 DIAGNOSIS — M4722 Other spondylosis with radiculopathy, cervical region: Secondary | ICD-10-CM | POA: Diagnosis not present

## 2014-10-02 DIAGNOSIS — M9902 Segmental and somatic dysfunction of thoracic region: Secondary | ICD-10-CM | POA: Diagnosis not present

## 2014-10-02 DIAGNOSIS — M4722 Other spondylosis with radiculopathy, cervical region: Secondary | ICD-10-CM | POA: Diagnosis not present

## 2014-10-02 DIAGNOSIS — M9901 Segmental and somatic dysfunction of cervical region: Secondary | ICD-10-CM | POA: Diagnosis not present

## 2014-10-02 DIAGNOSIS — M5414 Radiculopathy, thoracic region: Secondary | ICD-10-CM | POA: Diagnosis not present

## 2014-10-06 DIAGNOSIS — M9902 Segmental and somatic dysfunction of thoracic region: Secondary | ICD-10-CM | POA: Diagnosis not present

## 2014-10-06 DIAGNOSIS — M9901 Segmental and somatic dysfunction of cervical region: Secondary | ICD-10-CM | POA: Diagnosis not present

## 2014-10-06 DIAGNOSIS — M5414 Radiculopathy, thoracic region: Secondary | ICD-10-CM | POA: Diagnosis not present

## 2014-10-06 DIAGNOSIS — M4722 Other spondylosis with radiculopathy, cervical region: Secondary | ICD-10-CM | POA: Diagnosis not present

## 2014-10-08 ENCOUNTER — Encounter: Payer: Self-pay | Admitting: Family Medicine

## 2014-10-09 DIAGNOSIS — M5414 Radiculopathy, thoracic region: Secondary | ICD-10-CM | POA: Diagnosis not present

## 2014-10-09 DIAGNOSIS — M9901 Segmental and somatic dysfunction of cervical region: Secondary | ICD-10-CM | POA: Diagnosis not present

## 2014-10-09 DIAGNOSIS — M9902 Segmental and somatic dysfunction of thoracic region: Secondary | ICD-10-CM | POA: Diagnosis not present

## 2014-10-09 DIAGNOSIS — M4722 Other spondylosis with radiculopathy, cervical region: Secondary | ICD-10-CM | POA: Diagnosis not present

## 2014-10-12 ENCOUNTER — Other Ambulatory Visit: Payer: Self-pay | Admitting: Family Medicine

## 2014-10-12 MED ORDER — FLUTICASONE PROPIONATE HFA 110 MCG/ACT IN AERO: 2.0000 | INHALATION_SPRAY | Freq: Two times a day (BID) | RESPIRATORY_TRACT | Status: DC

## 2014-10-14 DIAGNOSIS — M4722 Other spondylosis with radiculopathy, cervical region: Secondary | ICD-10-CM | POA: Diagnosis not present

## 2014-10-14 DIAGNOSIS — M5414 Radiculopathy, thoracic region: Secondary | ICD-10-CM | POA: Diagnosis not present

## 2014-10-14 DIAGNOSIS — M9902 Segmental and somatic dysfunction of thoracic region: Secondary | ICD-10-CM | POA: Diagnosis not present

## 2014-10-14 DIAGNOSIS — M9901 Segmental and somatic dysfunction of cervical region: Secondary | ICD-10-CM | POA: Diagnosis not present

## 2014-10-16 DIAGNOSIS — M9902 Segmental and somatic dysfunction of thoracic region: Secondary | ICD-10-CM | POA: Diagnosis not present

## 2014-10-16 DIAGNOSIS — M4722 Other spondylosis with radiculopathy, cervical region: Secondary | ICD-10-CM | POA: Diagnosis not present

## 2014-10-16 DIAGNOSIS — M9901 Segmental and somatic dysfunction of cervical region: Secondary | ICD-10-CM | POA: Diagnosis not present

## 2014-10-16 DIAGNOSIS — M5414 Radiculopathy, thoracic region: Secondary | ICD-10-CM | POA: Diagnosis not present

## 2014-10-20 DIAGNOSIS — M9902 Segmental and somatic dysfunction of thoracic region: Secondary | ICD-10-CM | POA: Diagnosis not present

## 2014-10-20 DIAGNOSIS — M4722 Other spondylosis with radiculopathy, cervical region: Secondary | ICD-10-CM | POA: Diagnosis not present

## 2014-10-20 DIAGNOSIS — M9901 Segmental and somatic dysfunction of cervical region: Secondary | ICD-10-CM | POA: Diagnosis not present

## 2014-10-20 DIAGNOSIS — M5414 Radiculopathy, thoracic region: Secondary | ICD-10-CM | POA: Diagnosis not present

## 2014-10-23 DIAGNOSIS — M5414 Radiculopathy, thoracic region: Secondary | ICD-10-CM | POA: Diagnosis not present

## 2014-10-23 DIAGNOSIS — M9902 Segmental and somatic dysfunction of thoracic region: Secondary | ICD-10-CM | POA: Diagnosis not present

## 2014-10-23 DIAGNOSIS — M9901 Segmental and somatic dysfunction of cervical region: Secondary | ICD-10-CM | POA: Diagnosis not present

## 2014-10-23 DIAGNOSIS — M4722 Other spondylosis with radiculopathy, cervical region: Secondary | ICD-10-CM | POA: Diagnosis not present

## 2014-10-27 DIAGNOSIS — M5414 Radiculopathy, thoracic region: Secondary | ICD-10-CM | POA: Diagnosis not present

## 2014-10-27 DIAGNOSIS — M9901 Segmental and somatic dysfunction of cervical region: Secondary | ICD-10-CM | POA: Diagnosis not present

## 2014-10-27 DIAGNOSIS — M4722 Other spondylosis with radiculopathy, cervical region: Secondary | ICD-10-CM | POA: Diagnosis not present

## 2014-10-27 DIAGNOSIS — M9902 Segmental and somatic dysfunction of thoracic region: Secondary | ICD-10-CM | POA: Diagnosis not present

## 2014-10-27 DIAGNOSIS — K219 Gastro-esophageal reflux disease without esophagitis: Secondary | ICD-10-CM | POA: Diagnosis not present

## 2014-11-03 DIAGNOSIS — M9901 Segmental and somatic dysfunction of cervical region: Secondary | ICD-10-CM | POA: Diagnosis not present

## 2014-11-03 DIAGNOSIS — M9902 Segmental and somatic dysfunction of thoracic region: Secondary | ICD-10-CM | POA: Diagnosis not present

## 2014-11-03 DIAGNOSIS — M5414 Radiculopathy, thoracic region: Secondary | ICD-10-CM | POA: Diagnosis not present

## 2014-11-03 DIAGNOSIS — M4722 Other spondylosis with radiculopathy, cervical region: Secondary | ICD-10-CM | POA: Diagnosis not present

## 2014-11-06 DIAGNOSIS — K2970 Gastritis, unspecified, without bleeding: Secondary | ICD-10-CM | POA: Diagnosis not present

## 2014-11-06 DIAGNOSIS — K222 Esophageal obstruction: Secondary | ICD-10-CM | POA: Diagnosis not present

## 2014-11-06 DIAGNOSIS — R1310 Dysphagia, unspecified: Secondary | ICD-10-CM | POA: Diagnosis not present

## 2014-11-14 DIAGNOSIS — M7072 Other bursitis of hip, left hip: Secondary | ICD-10-CM | POA: Diagnosis not present

## 2014-11-14 DIAGNOSIS — M9902 Segmental and somatic dysfunction of thoracic region: Secondary | ICD-10-CM | POA: Diagnosis not present

## 2014-11-14 DIAGNOSIS — M542 Cervicalgia: Secondary | ICD-10-CM | POA: Diagnosis not present

## 2014-11-14 DIAGNOSIS — M545 Low back pain: Secondary | ICD-10-CM | POA: Diagnosis not present

## 2014-11-14 DIAGNOSIS — M5414 Radiculopathy, thoracic region: Secondary | ICD-10-CM | POA: Diagnosis not present

## 2014-11-14 DIAGNOSIS — M797 Fibromyalgia: Secondary | ICD-10-CM | POA: Diagnosis not present

## 2014-11-14 DIAGNOSIS — M9901 Segmental and somatic dysfunction of cervical region: Secondary | ICD-10-CM | POA: Diagnosis not present

## 2014-11-14 DIAGNOSIS — M4722 Other spondylosis with radiculopathy, cervical region: Secondary | ICD-10-CM | POA: Diagnosis not present

## 2014-11-21 DIAGNOSIS — M4722 Other spondylosis with radiculopathy, cervical region: Secondary | ICD-10-CM | POA: Diagnosis not present

## 2014-11-21 DIAGNOSIS — M9902 Segmental and somatic dysfunction of thoracic region: Secondary | ICD-10-CM | POA: Diagnosis not present

## 2014-11-21 DIAGNOSIS — M9901 Segmental and somatic dysfunction of cervical region: Secondary | ICD-10-CM | POA: Diagnosis not present

## 2014-11-21 DIAGNOSIS — M5414 Radiculopathy, thoracic region: Secondary | ICD-10-CM | POA: Diagnosis not present

## 2014-11-26 DIAGNOSIS — M5414 Radiculopathy, thoracic region: Secondary | ICD-10-CM | POA: Diagnosis not present

## 2014-11-26 DIAGNOSIS — M9901 Segmental and somatic dysfunction of cervical region: Secondary | ICD-10-CM | POA: Diagnosis not present

## 2014-11-26 DIAGNOSIS — M4722 Other spondylosis with radiculopathy, cervical region: Secondary | ICD-10-CM | POA: Diagnosis not present

## 2014-11-26 DIAGNOSIS — M9902 Segmental and somatic dysfunction of thoracic region: Secondary | ICD-10-CM | POA: Diagnosis not present

## 2014-12-02 DIAGNOSIS — M9901 Segmental and somatic dysfunction of cervical region: Secondary | ICD-10-CM | POA: Diagnosis not present

## 2014-12-02 DIAGNOSIS — M4722 Other spondylosis with radiculopathy, cervical region: Secondary | ICD-10-CM | POA: Diagnosis not present

## 2014-12-02 DIAGNOSIS — M5414 Radiculopathy, thoracic region: Secondary | ICD-10-CM | POA: Diagnosis not present

## 2014-12-02 DIAGNOSIS — M9902 Segmental and somatic dysfunction of thoracic region: Secondary | ICD-10-CM | POA: Diagnosis not present

## 2014-12-10 DIAGNOSIS — M4722 Other spondylosis with radiculopathy, cervical region: Secondary | ICD-10-CM | POA: Diagnosis not present

## 2014-12-10 DIAGNOSIS — M9901 Segmental and somatic dysfunction of cervical region: Secondary | ICD-10-CM | POA: Diagnosis not present

## 2014-12-10 DIAGNOSIS — M5414 Radiculopathy, thoracic region: Secondary | ICD-10-CM | POA: Diagnosis not present

## 2014-12-10 DIAGNOSIS — M9902 Segmental and somatic dysfunction of thoracic region: Secondary | ICD-10-CM | POA: Diagnosis not present

## 2014-12-17 DIAGNOSIS — M9902 Segmental and somatic dysfunction of thoracic region: Secondary | ICD-10-CM | POA: Diagnosis not present

## 2014-12-17 DIAGNOSIS — M4722 Other spondylosis with radiculopathy, cervical region: Secondary | ICD-10-CM | POA: Diagnosis not present

## 2014-12-17 DIAGNOSIS — M9901 Segmental and somatic dysfunction of cervical region: Secondary | ICD-10-CM | POA: Diagnosis not present

## 2014-12-17 DIAGNOSIS — M5414 Radiculopathy, thoracic region: Secondary | ICD-10-CM | POA: Diagnosis not present

## 2014-12-25 DIAGNOSIS — M9901 Segmental and somatic dysfunction of cervical region: Secondary | ICD-10-CM | POA: Diagnosis not present

## 2014-12-25 DIAGNOSIS — M5414 Radiculopathy, thoracic region: Secondary | ICD-10-CM | POA: Diagnosis not present

## 2014-12-25 DIAGNOSIS — M9902 Segmental and somatic dysfunction of thoracic region: Secondary | ICD-10-CM | POA: Diagnosis not present

## 2014-12-25 DIAGNOSIS — M4722 Other spondylosis with radiculopathy, cervical region: Secondary | ICD-10-CM | POA: Diagnosis not present

## 2015-01-01 DIAGNOSIS — M4722 Other spondylosis with radiculopathy, cervical region: Secondary | ICD-10-CM | POA: Diagnosis not present

## 2015-01-01 DIAGNOSIS — M9902 Segmental and somatic dysfunction of thoracic region: Secondary | ICD-10-CM | POA: Diagnosis not present

## 2015-01-01 DIAGNOSIS — M9901 Segmental and somatic dysfunction of cervical region: Secondary | ICD-10-CM | POA: Diagnosis not present

## 2015-01-01 DIAGNOSIS — M5414 Radiculopathy, thoracic region: Secondary | ICD-10-CM | POA: Diagnosis not present

## 2015-01-05 DIAGNOSIS — M9902 Segmental and somatic dysfunction of thoracic region: Secondary | ICD-10-CM | POA: Diagnosis not present

## 2015-01-05 DIAGNOSIS — M4722 Other spondylosis with radiculopathy, cervical region: Secondary | ICD-10-CM | POA: Diagnosis not present

## 2015-01-05 DIAGNOSIS — M9901 Segmental and somatic dysfunction of cervical region: Secondary | ICD-10-CM | POA: Diagnosis not present

## 2015-01-05 DIAGNOSIS — M5414 Radiculopathy, thoracic region: Secondary | ICD-10-CM | POA: Diagnosis not present

## 2015-01-21 DIAGNOSIS — M5414 Radiculopathy, thoracic region: Secondary | ICD-10-CM | POA: Diagnosis not present

## 2015-01-21 DIAGNOSIS — M4722 Other spondylosis with radiculopathy, cervical region: Secondary | ICD-10-CM | POA: Diagnosis not present

## 2015-01-21 DIAGNOSIS — M9901 Segmental and somatic dysfunction of cervical region: Secondary | ICD-10-CM | POA: Diagnosis not present

## 2015-01-21 DIAGNOSIS — M9902 Segmental and somatic dysfunction of thoracic region: Secondary | ICD-10-CM | POA: Diagnosis not present

## 2015-01-26 DIAGNOSIS — M5414 Radiculopathy, thoracic region: Secondary | ICD-10-CM | POA: Diagnosis not present

## 2015-01-26 DIAGNOSIS — M9902 Segmental and somatic dysfunction of thoracic region: Secondary | ICD-10-CM | POA: Diagnosis not present

## 2015-01-26 DIAGNOSIS — M9901 Segmental and somatic dysfunction of cervical region: Secondary | ICD-10-CM | POA: Diagnosis not present

## 2015-01-26 DIAGNOSIS — M4722 Other spondylosis with radiculopathy, cervical region: Secondary | ICD-10-CM | POA: Diagnosis not present

## 2015-01-27 DIAGNOSIS — R51 Headache: Secondary | ICD-10-CM | POA: Diagnosis not present

## 2015-01-27 DIAGNOSIS — M797 Fibromyalgia: Secondary | ICD-10-CM | POA: Diagnosis not present

## 2015-01-27 DIAGNOSIS — M542 Cervicalgia: Secondary | ICD-10-CM | POA: Diagnosis not present

## 2015-01-27 DIAGNOSIS — M546 Pain in thoracic spine: Secondary | ICD-10-CM | POA: Diagnosis not present

## 2015-02-02 DIAGNOSIS — M9901 Segmental and somatic dysfunction of cervical region: Secondary | ICD-10-CM | POA: Diagnosis not present

## 2015-02-02 DIAGNOSIS — M9902 Segmental and somatic dysfunction of thoracic region: Secondary | ICD-10-CM | POA: Diagnosis not present

## 2015-02-02 DIAGNOSIS — M5414 Radiculopathy, thoracic region: Secondary | ICD-10-CM | POA: Diagnosis not present

## 2015-02-02 DIAGNOSIS — M4722 Other spondylosis with radiculopathy, cervical region: Secondary | ICD-10-CM | POA: Diagnosis not present

## 2015-02-04 ENCOUNTER — Encounter: Payer: Self-pay | Admitting: Physician Assistant

## 2015-02-04 ENCOUNTER — Ambulatory Visit (INDEPENDENT_AMBULATORY_CARE_PROVIDER_SITE_OTHER): Payer: Medicare Other | Admitting: Physician Assistant

## 2015-02-04 VITALS — BP 138/85 | HR 69 | Ht 65.0 in | Wt 135.0 lb

## 2015-02-04 DIAGNOSIS — H6691 Otitis media, unspecified, right ear: Secondary | ICD-10-CM

## 2015-02-04 DIAGNOSIS — H9203 Otalgia, bilateral: Secondary | ICD-10-CM

## 2015-02-04 DIAGNOSIS — L719 Rosacea, unspecified: Secondary | ICD-10-CM | POA: Diagnosis not present

## 2015-02-04 MED ORDER — METRONIDAZOLE 1 % EX GEL: Freq: Every day | CUTANEOUS | Status: DC

## 2015-02-04 MED ORDER — TRAMADOL HCL 50 MG PO TABS: 50.0000 mg | ORAL_TABLET | Freq: Four times a day (QID) | ORAL | Status: DC | PRN

## 2015-02-04 MED ORDER — PREDNISONE 20 MG PO TABS: ORAL_TABLET | ORAL | Status: DC

## 2015-02-04 MED ORDER — CEFDINIR 300 MG PO CAPS: 300.0000 mg | ORAL_CAPSULE | Freq: Two times a day (BID) | ORAL | Status: DC

## 2015-02-04 NOTE — Progress Notes (Signed)
   Subjective:    Patient ID: Breanna Casey, female    DOB: [birth year], 59 y.o.   MRN: [id number]  HPI  Patient is a 59 year old female who presents to the clinic with bilateral ear pain. Right ear pain started approximately 6 weeks ago. She also hears a "whooshing" sound shoot for. Yesterday. She has a mild sore throat and dry cough. She denies any sinus pressure. She's tried Allegra no relief. She does, nausea or vomiting  Patient does have bilateral red cheeks with some tiny red papules. She has tried her oral steroid in did not help. She figured it was an allergic reaction. It does not seem to be that. They're not tender.  Rheumatologist does not want to see her anymore just for tramadol refills. She would like for me to presribe.    Review of Systems  All other systems reviewed and are negative.      Objective:   Physical Exam  Constitutional: She is oriented to person, place, and time. She appears well-developed and well-nourished.  HENT:  Head: Normocephalic and atraumatic.    Left Ear: External ear normal.  Nose: Nose normal.  Mouth/Throat: Oropharynx is clear and moist. No oropharyngeal exudate.  Mild erythema in right external ear. No tenderness to palpation. TM very erythematous. Dullness/fluid behind TM from 6 to 12 oclock. No light relflex.    Eyes: Conjunctivae are normal. Right eye exhibits no discharge. Left eye exhibits no discharge.  Neck: Normal range of motion. Neck supple.  Cardiovascular: Normal rate, regular rhythm and normal heart sounds.   Pulmonary/Chest: Effort normal and breath sounds normal. She has no wheezes.  Lymphadenopathy:    She has no cervical adenopathy.  Neurological: She is alert and oriented to person, place, and time.  Psychiatric: She has a normal mood and affect. Her behavior is normal.          Assessment & Plan:  Right otitis media/bilaterally ear pain- left ear is clear. Pain could be due to eustachian tube dysfunction.  Right ear very erythematous but there does appear to be some fluid behind TM. Will treat with omnicef for 10 days. Not on allergy list. Pt instructed to take prednisone for inflammation as well as could prevent allergic reaction. Taper given to patient. Consider flonase nasal spray as well.   Hx of allergic reaction- refills of prednisone given to keep on hand if has allergic reaction to something.   Fibromyalgia- wants refill on tramadol. Saw Dr. Dossie Der, rheumatologist. She did work up and tramadol was only medication given. i will take over prescribing. Refilled today for 6 months.   roseasa- facial symptoms seem consisent. Given metrogel to use on affected areas and see if clears. Watch of allergic reaction.

## 2015-02-04 NOTE — Patient Instructions (Signed)
Rosacea Rosacea is a long-term (chronic) condition that affects the skin of the face (cheeks, nose, brow, and chin) and sometimes the eyes. Rosacea causes the blood vessels near the surface of the skin to enlarge, resulting in redness. This condition usually begins after age 59. It occurs most often in light-skinned women. Without treatment, rosacea tends to get worse over time. There is no cure for rosacea, but treatment can help control your symptoms. CAUSES  The cause is unknown. It is thought that some people may inherit a tendency to develop rosacea. Certain triggers can make your rosacea worse, including:  Hot baths.  Exercise.  Sunlight.  Very hot or cold temperatures.  Hot or spicy foods and drinks.  Drinking alcohol.  Stress.  Taking blood pressure medicine.  Long-term use of topical steroids on the face. SYMPTOMS   Redness of the face.  Red bumps or pimples on the face.  Red, enlarged nose (rhinophyma).  Blushing easily.  Red lines on the skin.  Irritated or burning feeling in the eyes.  Swollen eyelids. DIAGNOSIS  Your caregiver can usually tell what is wrong by asking about your symptoms and performing a physical exam. TREATMENT  Avoiding triggers is an important part of treatment. You will also need to see a skin specialist (dermatologist) who can develop a treatment plan for you. The goals of treatment are to control your condition and to improve the appearance of your skin. It may take several weeks or months of treatment before you notice an improvement in your skin. Even after your skin improves, you will likely need to continue treatment to prevent your rosacea from coming back. Treatment methods may include:  Using sunscreen or sunblock daily to protect the skin.  Antibiotic medicine, such as metronidazole, applied directly to the skin.  Antibiotics taken by mouth. This is usually prescribed if you have eye problems from your rosacea.  Laser surgery  to improve the appearance of the skin. This surgery can reduce the appearance of red lines on the skin and can remove excess tissue from the nose to reduce its size. HOME CARE INSTRUCTIONS  Avoid things that seem to trigger your flare-ups.  If you are given antibiotics, take them as directed. Finish them even if you start to feel better.  Use a gentle facial cleanser that does not contain alcohol.  You may use a mild facial moisturizer.  Use a sunscreen or sunblock with SPF 30 or greater.  Wear a green-tinted foundation powder to conceal redness, if needed. Choose cosmetics that are noncomedogenic. This means they do not block your pores.  If your eyelids are affected, apply warm compresses to the eyelids. Do this up to 4 times a day or as directed by your caregiver. SEEK MEDICAL CARE IF:  Your skin problems get worse.  You feel depressed.  You lose your appetite.  You have trouble concentrating.  You have problems with your eyes, such as redness or itching. MAKE SURE YOU:  Understand these instructions.  Will watch your condition.  Will get help right away if you are not doing well or get worse. Document Released: 12/29/2004 Document Revised: 05/22/2012 Document Reviewed: 11/01/2011 [facility] Patient Information 2015 [place], [place]. This information is not intended to replace advice given to you by your health care provider. Make sure you discuss any questions you have with your health care provider.

## 2015-02-05 ENCOUNTER — Other Ambulatory Visit: Payer: Self-pay | Admitting: Physician Assistant

## 2015-02-05 ENCOUNTER — Encounter: Payer: Self-pay | Admitting: Physician Assistant

## 2015-02-05 MED ORDER — FLUCONAZOLE 150 MG PO TABS: 150.0000 mg | ORAL_TABLET | Freq: Once | ORAL | Status: DC

## 2015-02-06 ENCOUNTER — Encounter: Payer: Self-pay | Admitting: Physician Assistant

## 2015-02-06 ENCOUNTER — Other Ambulatory Visit: Payer: Self-pay | Admitting: Physician Assistant

## 2015-02-06 MED ORDER — BIAXIN 250 MG PO TABS: 250.0000 mg | ORAL_TABLET | Freq: Two times a day (BID) | ORAL | Status: DC

## 2015-02-06 NOTE — Progress Notes (Signed)
Per pt states she can tolerate BRAND only biaxin.

## 2015-02-09 ENCOUNTER — Encounter: Payer: Self-pay | Admitting: Physician Assistant

## 2015-02-10 ENCOUNTER — Telehealth: Payer: Self-pay | Admitting: *Deleted

## 2015-02-10 ENCOUNTER — Other Ambulatory Visit: Payer: Self-pay | Admitting: Physician Assistant

## 2015-02-10 DIAGNOSIS — H00024 Hordeolum internum left upper eyelid: Secondary | ICD-10-CM | POA: Diagnosis not present

## 2015-02-10 MED ORDER — PREDNISONE 20 MG PO TABS: ORAL_TABLET | ORAL | Status: DC

## 2015-02-10 NOTE — Telephone Encounter (Signed)
Pt said her ear MAY be a little bit better but now she has a huge stye and can't see out of her eye.  Pharm said they spent 45 min calling every pharm in [place] & brand biaxin is unavailable period.

## 2015-02-10 NOTE — Telephone Encounter (Signed)
NO pt can only tolerate BRAND. Can we call another pharmacy? She has severe allergies to generic Biaxin.   Call pt maybe we should recheck ear and see if improving without abx.

## 2015-02-10 NOTE — Telephone Encounter (Signed)
Pharm left vm stating that they are unable to get the brand Biaxin so they wanted to know if you were ok with the generic or if you want to send something else.  Please advise.

## 2015-02-10 NOTE — Telephone Encounter (Signed)
For the stye warm compress. You are allergic ointment that we usually given. Unfortunately I don't know of any other abx you can try. Let's watchfully wait. You can always follow up and recheck ears. Some ear infections clear without abx.  If feel like not improving may need to go to ER.

## 2015-02-11 ENCOUNTER — Encounter: Payer: Self-pay | Admitting: Physician Assistant

## 2015-02-19 DIAGNOSIS — R1013 Epigastric pain: Secondary | ICD-10-CM | POA: Diagnosis not present

## 2015-02-20 ENCOUNTER — Encounter: Payer: Self-pay | Admitting: Physician Assistant

## 2015-02-23 ENCOUNTER — Other Ambulatory Visit: Payer: Self-pay | Admitting: Physician Assistant

## 2015-02-23 ENCOUNTER — Encounter: Payer: Self-pay | Admitting: Physician Assistant

## 2015-02-23 MED ORDER — BRIMONIDINE TARTRATE 0.33 % EX GEL: CUTANEOUS | Status: DC

## 2015-02-25 DIAGNOSIS — M9902 Segmental and somatic dysfunction of thoracic region: Secondary | ICD-10-CM | POA: Diagnosis not present

## 2015-02-25 DIAGNOSIS — M9901 Segmental and somatic dysfunction of cervical region: Secondary | ICD-10-CM | POA: Diagnosis not present

## 2015-02-25 DIAGNOSIS — M5414 Radiculopathy, thoracic region: Secondary | ICD-10-CM | POA: Diagnosis not present

## 2015-02-25 DIAGNOSIS — M4722 Other spondylosis with radiculopathy, cervical region: Secondary | ICD-10-CM | POA: Diagnosis not present

## 2015-03-04 DIAGNOSIS — M5414 Radiculopathy, thoracic region: Secondary | ICD-10-CM | POA: Diagnosis not present

## 2015-03-04 DIAGNOSIS — M9901 Segmental and somatic dysfunction of cervical region: Secondary | ICD-10-CM | POA: Diagnosis not present

## 2015-03-04 DIAGNOSIS — M9902 Segmental and somatic dysfunction of thoracic region: Secondary | ICD-10-CM | POA: Diagnosis not present

## 2015-03-04 DIAGNOSIS — M4722 Other spondylosis with radiculopathy, cervical region: Secondary | ICD-10-CM | POA: Diagnosis not present

## 2015-03-09 DIAGNOSIS — M5431 Sciatica, right side: Secondary | ICD-10-CM | POA: Diagnosis not present

## 2015-03-09 DIAGNOSIS — M9905 Segmental and somatic dysfunction of pelvic region: Secondary | ICD-10-CM | POA: Diagnosis not present

## 2015-03-09 DIAGNOSIS — M9903 Segmental and somatic dysfunction of lumbar region: Secondary | ICD-10-CM | POA: Diagnosis not present

## 2015-03-09 DIAGNOSIS — M5432 Sciatica, left side: Secondary | ICD-10-CM | POA: Diagnosis not present

## 2015-03-12 DIAGNOSIS — M5431 Sciatica, right side: Secondary | ICD-10-CM | POA: Diagnosis not present

## 2015-03-12 DIAGNOSIS — M9905 Segmental and somatic dysfunction of pelvic region: Secondary | ICD-10-CM | POA: Diagnosis not present

## 2015-03-12 DIAGNOSIS — M5432 Sciatica, left side: Secondary | ICD-10-CM | POA: Diagnosis not present

## 2015-03-12 DIAGNOSIS — M9903 Segmental and somatic dysfunction of lumbar region: Secondary | ICD-10-CM | POA: Diagnosis not present

## 2015-03-16 DIAGNOSIS — M9905 Segmental and somatic dysfunction of pelvic region: Secondary | ICD-10-CM | POA: Diagnosis not present

## 2015-03-16 DIAGNOSIS — M9903 Segmental and somatic dysfunction of lumbar region: Secondary | ICD-10-CM | POA: Diagnosis not present

## 2015-03-16 DIAGNOSIS — M5432 Sciatica, left side: Secondary | ICD-10-CM | POA: Diagnosis not present

## 2015-03-16 DIAGNOSIS — M5431 Sciatica, right side: Secondary | ICD-10-CM | POA: Diagnosis not present

## 2015-03-20 ENCOUNTER — Encounter: Payer: Self-pay | Admitting: Physician Assistant

## 2015-03-20 ENCOUNTER — Ambulatory Visit (INDEPENDENT_AMBULATORY_CARE_PROVIDER_SITE_OTHER): Payer: Medicare Other

## 2015-03-20 ENCOUNTER — Ambulatory Visit (INDEPENDENT_AMBULATORY_CARE_PROVIDER_SITE_OTHER): Payer: Medicare Other | Admitting: Physician Assistant

## 2015-03-20 VITALS — BP 160/100 | HR 84 | Wt 136.0 lb

## 2015-03-20 DIAGNOSIS — M5432 Sciatica, left side: Secondary | ICD-10-CM | POA: Diagnosis not present

## 2015-03-20 DIAGNOSIS — R0781 Pleurodynia: Secondary | ICD-10-CM

## 2015-03-20 DIAGNOSIS — R438 Other disturbances of smell and taste: Secondary | ICD-10-CM | POA: Diagnosis not present

## 2015-03-20 DIAGNOSIS — M9903 Segmental and somatic dysfunction of lumbar region: Secondary | ICD-10-CM | POA: Diagnosis not present

## 2015-03-20 DIAGNOSIS — Z79899 Other long term (current) drug therapy: Secondary | ICD-10-CM

## 2015-03-20 DIAGNOSIS — M5431 Sciatica, right side: Secondary | ICD-10-CM | POA: Diagnosis not present

## 2015-03-20 DIAGNOSIS — M9905 Segmental and somatic dysfunction of pelvic region: Secondary | ICD-10-CM | POA: Diagnosis not present

## 2015-03-20 DIAGNOSIS — E559 Vitamin D deficiency, unspecified: Secondary | ICD-10-CM

## 2015-03-20 DIAGNOSIS — R1011 Right upper quadrant pain: Secondary | ICD-10-CM | POA: Diagnosis not present

## 2015-03-20 NOTE — Progress Notes (Signed)
   Subjective:    Patient ID: Breanna Casey, female    DOB: January 29, 1956, 59 y.o.   MRN: [id number]  HPI  Pt presents to the clinic with 4 months of RUQ stabbing pain that is episodic. No known trigger. Can happen at anytime. When she is sitting, standing, at night, during the day, at grocery store, before eating, and after eating. Tried nexium for a month or more with no relief. Describes pain when occurs as excruciating. No nausea or vomiting. Dry cough occasionally but no more than usual. Constipation on and off but no correlation. On [email or web address] inflammation on endoscopy on 10/2014.   She also has metallic taste in her mouth for months. No OTC medications. Not on any irons or new supplements. Good oral hygiene and went to the dentist recently.    Review of Systems  All other systems reviewed and are negative.      Objective:   Physical Exam  Constitutional: She is oriented to person, place, and time. She appears well-developed and well-nourished.  HENT:  Head: Normocephalic and atraumatic.  Cardiovascular: Normal rate, regular rhythm and normal heart sounds.   Pulmonary/Chest: Effort normal and breath sounds normal.  Abdominal: Soft. Bowel sounds are normal. She exhibits no distension and no mass. There is no tenderness. There is no rebound and no guarding.  Tenderness over the right last rib to palpation. No mass palpated.   No liver enlargement.   Neurological: She is alert and oriented to person, place, and time.  Skin: Skin is dry.  Psychiatric: She has a normal mood and affect. Her behavior is normal.          Assessment & Plan:  RUQ episodic pain- unclear etiology. No treatment because they resolve on there own and quickly. Discussed come be a spasm of diaphram. Consider magnesium. Pt does not have gallbladder. Will check liver panel, CRP and get a CXR since some pain with palpation over lower right rib. Pt already on probiotic. nexium did not help. Hold off on PPI  at this point. GI normal endocsopy with mild inflammation 10/2014.   Metallic taste in mouth- not on any new medication or ones that would cause. Just recently went to dentitist and has good oral hygiene. Not on any metals. Unclear etiology as this time. Will check for any liver issue that could be causing metallic taste.

## 2015-03-21 LAB — HEPATITIS PANEL, ACUTE
HCV Ab: NEGATIVE
Hep A IgM: NONREACTIVE
Hep B C IgM: NONREACTIVE
Hepatitis B Surface Ag: NEGATIVE

## 2015-03-21 LAB — VITAMIN D 25 HYDROXY (VIT D DEFICIENCY, FRACTURES): Vit D, 25-Hydroxy: 47 ng/mL (ref 30–100)

## 2015-03-21 LAB — COMPLETE METABOLIC PANEL WITH GFR
ALT: 16 U/L (ref 0–35)
AST: 22 U/L (ref 0–37)
Albumin: 4.3 g/dL (ref 3.5–5.2)
Alkaline Phosphatase: 59 U/L (ref 39–117)
BUN: 8 mg/dL (ref 6–23)
CO2: 27 mEq/L (ref 19–32)
Calcium: 9.2 mg/dL (ref 8.4–10.5)
Chloride: 102 mEq/L (ref 96–112)
Creat: 0.66 mg/dL (ref 0.50–1.10)
GFR, Est African American: >89 mL/min
GFR, Est Non African American: >89 mL/min
Glucose, Bld: 75 mg/dL (ref 70–99)
Potassium: 4 mEq/L (ref 3.5–5.3)
Sodium: 141 mEq/L (ref 135–145)
Total Bilirubin: 0.4 mg/dL (ref 0.2–1.2)
Total Protein: 6.5 g/dL (ref 6.0–8.3)

## 2015-03-21 LAB — C-REACTIVE PROTEIN: CRP: <0.5 mg/dL (ref <0.60)

## 2015-03-22 DIAGNOSIS — R1011 Right upper quadrant pain: Secondary | ICD-10-CM | POA: Insufficient documentation

## 2015-03-22 DIAGNOSIS — R438 Other disturbances of smell and taste: Secondary | ICD-10-CM | POA: Insufficient documentation

## 2015-03-23 ENCOUNTER — Other Ambulatory Visit: Payer: Self-pay | Admitting: Physician Assistant

## 2015-03-23 ENCOUNTER — Telehealth: Payer: Self-pay | Admitting: Family Medicine

## 2015-03-23 DIAGNOSIS — M9905 Segmental and somatic dysfunction of pelvic region: Secondary | ICD-10-CM | POA: Diagnosis not present

## 2015-03-23 DIAGNOSIS — M5432 Sciatica, left side: Secondary | ICD-10-CM | POA: Diagnosis not present

## 2015-03-23 DIAGNOSIS — M5431 Sciatica, right side: Secondary | ICD-10-CM | POA: Diagnosis not present

## 2015-03-23 DIAGNOSIS — R911 Solitary pulmonary nodule: Secondary | ICD-10-CM | POA: Insufficient documentation

## 2015-03-23 DIAGNOSIS — M9903 Segmental and somatic dysfunction of lumbar region: Secondary | ICD-10-CM | POA: Diagnosis not present

## 2015-03-23 NOTE — Telephone Encounter (Signed)
Call report: Opal Sidles from [facility] 18mm nodular opacity over left lower lobe for which CT thorax is suggested. No rib lesions.   This imaging study has already been reviewed by Iran Planas, PA.

## 2015-03-26 ENCOUNTER — Ambulatory Visit (INDEPENDENT_AMBULATORY_CARE_PROVIDER_SITE_OTHER): Payer: Medicare Other

## 2015-03-26 DIAGNOSIS — R918 Other nonspecific abnormal finding of lung field: Secondary | ICD-10-CM | POA: Diagnosis not present

## 2015-03-26 DIAGNOSIS — J984 Other disorders of lung: Secondary | ICD-10-CM | POA: Diagnosis not present

## 2015-03-26 DIAGNOSIS — R911 Solitary pulmonary nodule: Secondary | ICD-10-CM

## 2015-03-30 DIAGNOSIS — M9903 Segmental and somatic dysfunction of lumbar region: Secondary | ICD-10-CM | POA: Diagnosis not present

## 2015-03-30 DIAGNOSIS — M9905 Segmental and somatic dysfunction of pelvic region: Secondary | ICD-10-CM | POA: Diagnosis not present

## 2015-03-30 DIAGNOSIS — M5432 Sciatica, left side: Secondary | ICD-10-CM | POA: Diagnosis not present

## 2015-03-30 DIAGNOSIS — M5431 Sciatica, right side: Secondary | ICD-10-CM | POA: Diagnosis not present

## 2015-04-03 DIAGNOSIS — M5431 Sciatica, right side: Secondary | ICD-10-CM | POA: Diagnosis not present

## 2015-04-03 DIAGNOSIS — M9905 Segmental and somatic dysfunction of pelvic region: Secondary | ICD-10-CM | POA: Diagnosis not present

## 2015-04-03 DIAGNOSIS — M9903 Segmental and somatic dysfunction of lumbar region: Secondary | ICD-10-CM | POA: Diagnosis not present

## 2015-04-03 DIAGNOSIS — M5432 Sciatica, left side: Secondary | ICD-10-CM | POA: Diagnosis not present

## 2015-04-06 DIAGNOSIS — M9905 Segmental and somatic dysfunction of pelvic region: Secondary | ICD-10-CM | POA: Diagnosis not present

## 2015-04-06 DIAGNOSIS — M5431 Sciatica, right side: Secondary | ICD-10-CM | POA: Diagnosis not present

## 2015-04-06 DIAGNOSIS — M5432 Sciatica, left side: Secondary | ICD-10-CM | POA: Diagnosis not present

## 2015-04-06 DIAGNOSIS — M9903 Segmental and somatic dysfunction of lumbar region: Secondary | ICD-10-CM | POA: Diagnosis not present

## 2015-04-08 DIAGNOSIS — M9903 Segmental and somatic dysfunction of lumbar region: Secondary | ICD-10-CM | POA: Diagnosis not present

## 2015-04-08 DIAGNOSIS — M5431 Sciatica, right side: Secondary | ICD-10-CM | POA: Diagnosis not present

## 2015-04-08 DIAGNOSIS — M5432 Sciatica, left side: Secondary | ICD-10-CM | POA: Diagnosis not present

## 2015-04-08 DIAGNOSIS — M9905 Segmental and somatic dysfunction of pelvic region: Secondary | ICD-10-CM | POA: Diagnosis not present

## 2015-04-13 DIAGNOSIS — M9903 Segmental and somatic dysfunction of lumbar region: Secondary | ICD-10-CM | POA: Diagnosis not present

## 2015-04-13 DIAGNOSIS — M9905 Segmental and somatic dysfunction of pelvic region: Secondary | ICD-10-CM | POA: Diagnosis not present

## 2015-04-13 DIAGNOSIS — M5432 Sciatica, left side: Secondary | ICD-10-CM | POA: Diagnosis not present

## 2015-04-13 DIAGNOSIS — M5431 Sciatica, right side: Secondary | ICD-10-CM | POA: Diagnosis not present

## 2015-04-16 DIAGNOSIS — M9903 Segmental and somatic dysfunction of lumbar region: Secondary | ICD-10-CM | POA: Diagnosis not present

## 2015-04-16 DIAGNOSIS — M5432 Sciatica, left side: Secondary | ICD-10-CM | POA: Diagnosis not present

## 2015-04-16 DIAGNOSIS — M5431 Sciatica, right side: Secondary | ICD-10-CM | POA: Diagnosis not present

## 2015-04-16 DIAGNOSIS — M9905 Segmental and somatic dysfunction of pelvic region: Secondary | ICD-10-CM | POA: Diagnosis not present

## 2015-04-21 DIAGNOSIS — M5432 Sciatica, left side: Secondary | ICD-10-CM | POA: Diagnosis not present

## 2015-04-21 DIAGNOSIS — M9903 Segmental and somatic dysfunction of lumbar region: Secondary | ICD-10-CM | POA: Diagnosis not present

## 2015-04-21 DIAGNOSIS — M5431 Sciatica, right side: Secondary | ICD-10-CM | POA: Diagnosis not present

## 2015-04-21 DIAGNOSIS — M9905 Segmental and somatic dysfunction of pelvic region: Secondary | ICD-10-CM | POA: Diagnosis not present

## 2015-04-24 DIAGNOSIS — M5432 Sciatica, left side: Secondary | ICD-10-CM | POA: Diagnosis not present

## 2015-04-24 DIAGNOSIS — M9903 Segmental and somatic dysfunction of lumbar region: Secondary | ICD-10-CM | POA: Diagnosis not present

## 2015-04-24 DIAGNOSIS — M9905 Segmental and somatic dysfunction of pelvic region: Secondary | ICD-10-CM | POA: Diagnosis not present

## 2015-04-24 DIAGNOSIS — M5431 Sciatica, right side: Secondary | ICD-10-CM | POA: Diagnosis not present

## 2015-04-27 DIAGNOSIS — M9905 Segmental and somatic dysfunction of pelvic region: Secondary | ICD-10-CM | POA: Diagnosis not present

## 2015-04-27 DIAGNOSIS — M9903 Segmental and somatic dysfunction of lumbar region: Secondary | ICD-10-CM | POA: Diagnosis not present

## 2015-04-27 DIAGNOSIS — M5431 Sciatica, right side: Secondary | ICD-10-CM | POA: Diagnosis not present

## 2015-04-27 DIAGNOSIS — M5432 Sciatica, left side: Secondary | ICD-10-CM | POA: Diagnosis not present

## 2015-04-29 ENCOUNTER — Encounter: Payer: Self-pay | Admitting: Family Medicine

## 2015-05-01 DIAGNOSIS — M5431 Sciatica, right side: Secondary | ICD-10-CM | POA: Diagnosis not present

## 2015-05-01 DIAGNOSIS — M5432 Sciatica, left side: Secondary | ICD-10-CM | POA: Diagnosis not present

## 2015-05-01 DIAGNOSIS — M9903 Segmental and somatic dysfunction of lumbar region: Secondary | ICD-10-CM | POA: Diagnosis not present

## 2015-05-01 DIAGNOSIS — M9905 Segmental and somatic dysfunction of pelvic region: Secondary | ICD-10-CM | POA: Diagnosis not present

## 2015-05-05 DIAGNOSIS — M9903 Segmental and somatic dysfunction of lumbar region: Secondary | ICD-10-CM | POA: Diagnosis not present

## 2015-05-05 DIAGNOSIS — M5432 Sciatica, left side: Secondary | ICD-10-CM | POA: Diagnosis not present

## 2015-05-05 DIAGNOSIS — M9905 Segmental and somatic dysfunction of pelvic region: Secondary | ICD-10-CM | POA: Diagnosis not present

## 2015-05-05 DIAGNOSIS — M5431 Sciatica, right side: Secondary | ICD-10-CM | POA: Diagnosis not present

## 2015-05-05 IMAGING — CT CT CHEST W/O CM
4 series · 18 of 30 positions shown, 19 images · non-contrast
Comparison: Chest and left rib radiographs dated 03/20/2015.

CLINICAL DATA: Left lung nodule on left rib radiographs.

EXAM:
CT CHEST WITHOUT CONTRAST
TECHNIQUE: Multidetector CT imaging of the chest was performed following the
standard protocol without IV contrast..

[Series 2: chest w/o · axial · non-contrast · 0.60mm/px · z∈[-240,-80]mm · 4 of 64 slices shown, 5 images]
[im 16/64  mediastinal]
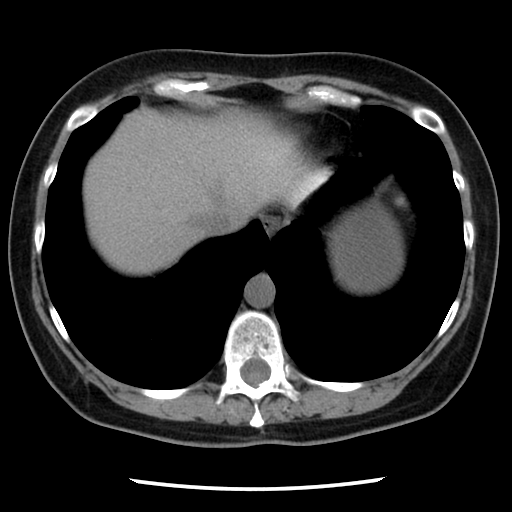
[im 16/64  lung]
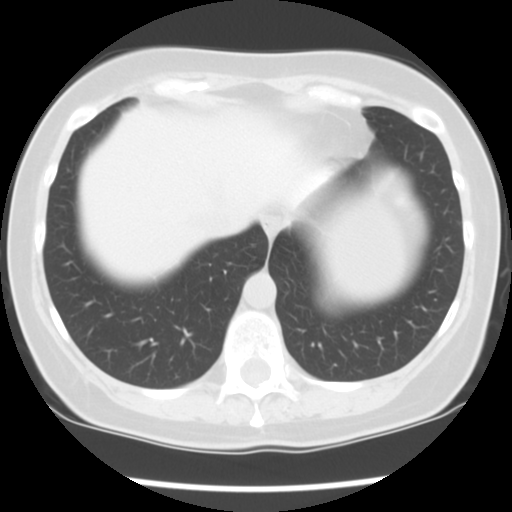
[im 31/64  lung]
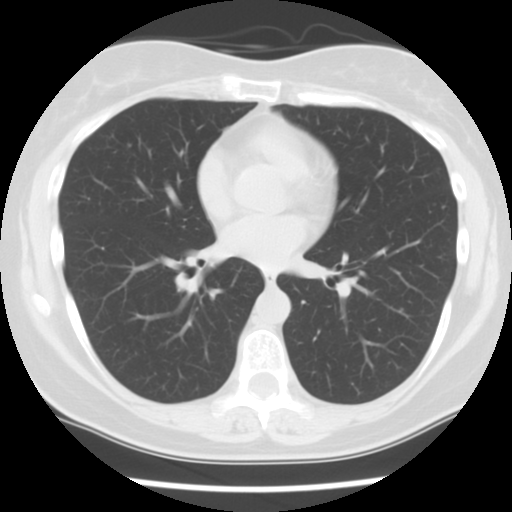
[im 32/64  lung]
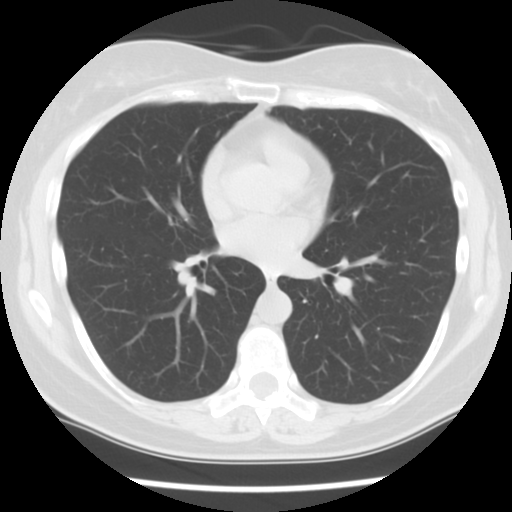
[im 48/64  lung]
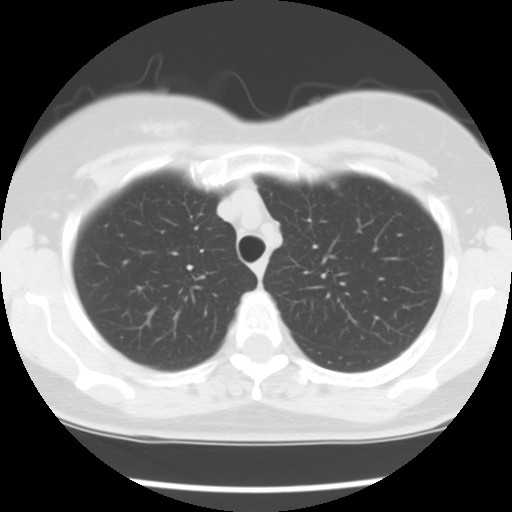

[Series 3: lung windows · axial · 0.60mm/px · z∈[-210,-105]mm · 3 of 64 slices shown]
[im 22/64  lung]
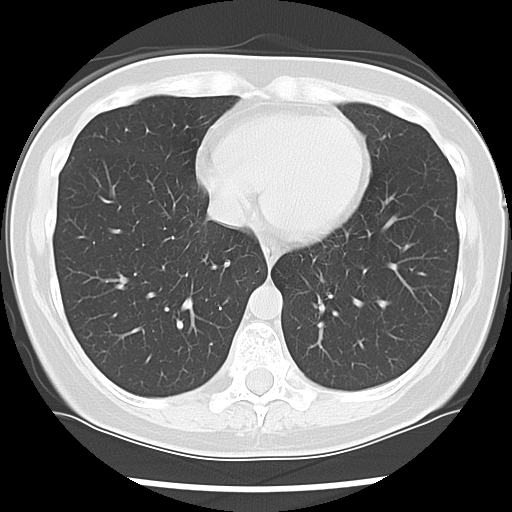
[im 31/64  lung]
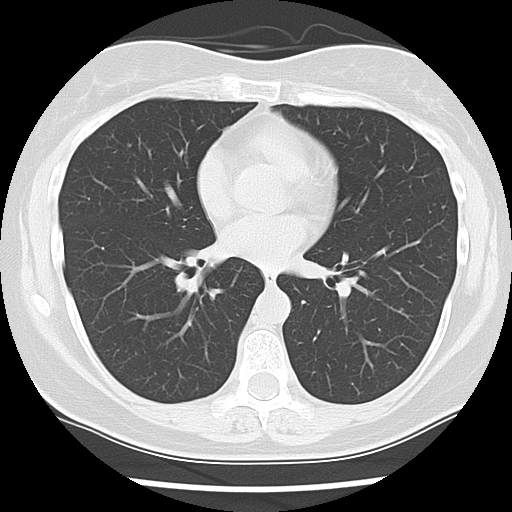
[im 43/64  lung]
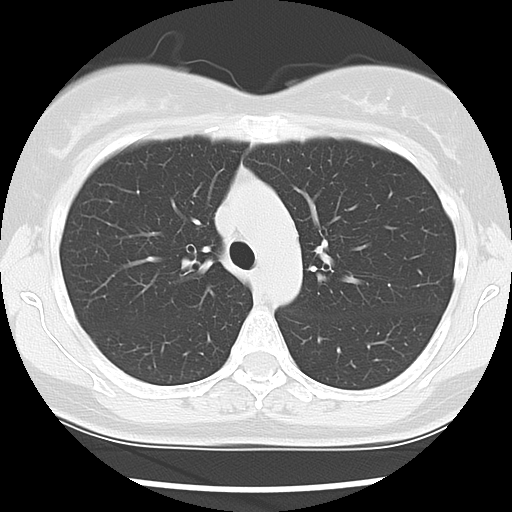

[Series 400: sag · sagittal · 0.63mm/px · 8 of 141 slices shown]
[im 16/141  lung]
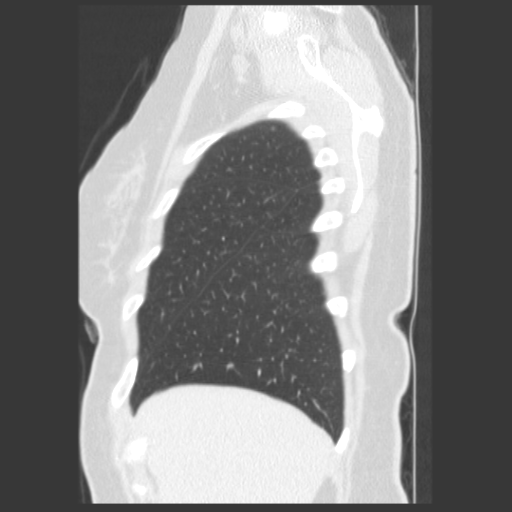
[im 32/141  lung]
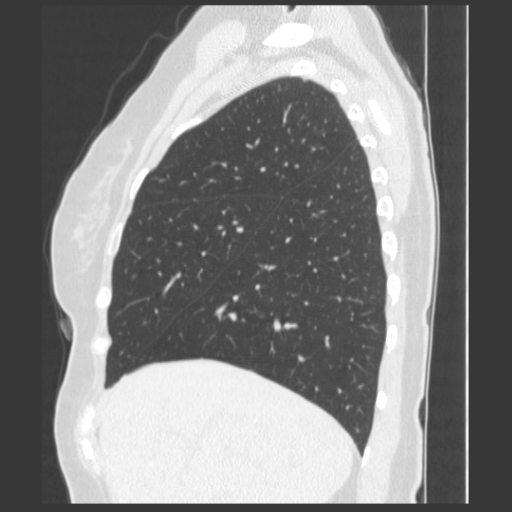
[im 47/141  lung]
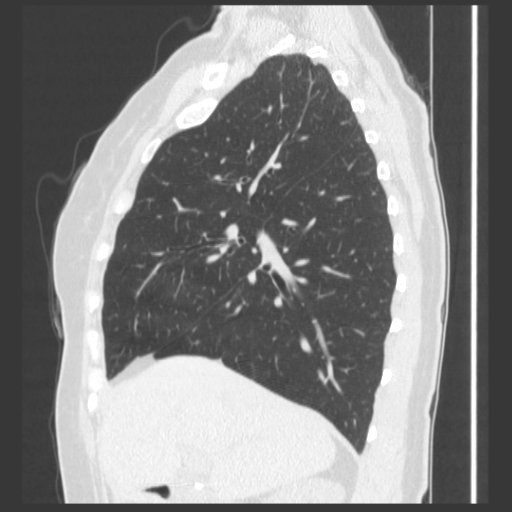
[im 63/141  lung]
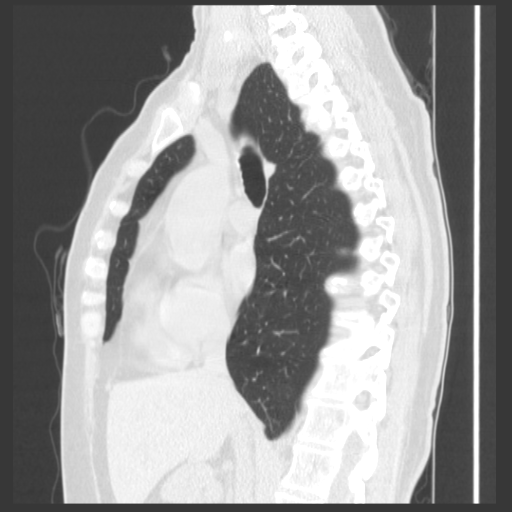
[im 78/141  lung]
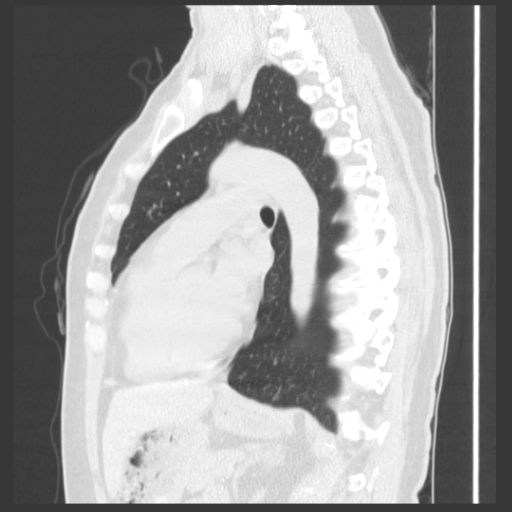
[im 94/141  lung]
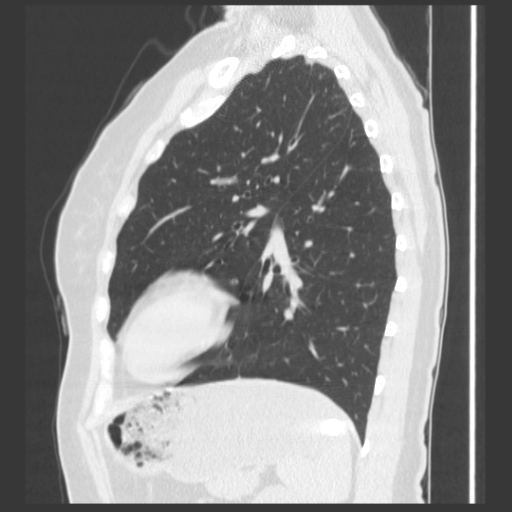
[im 109/141  lung]
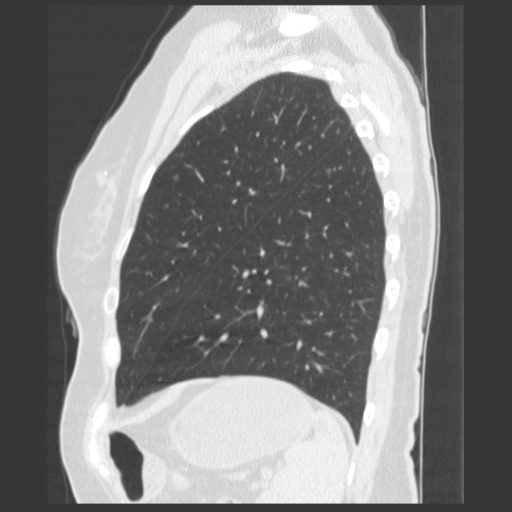
[im 125/141  lung]
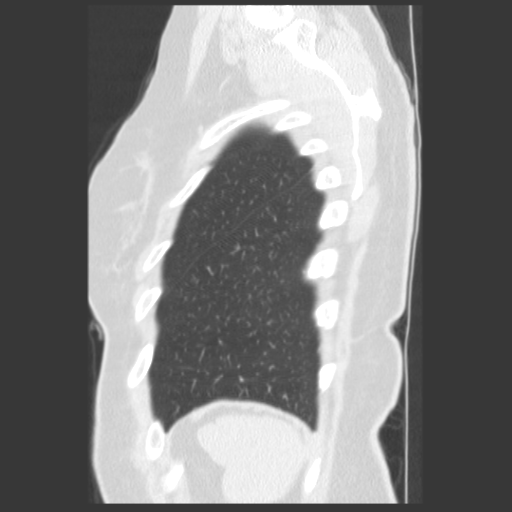

[Series 401: cor · coronal · 0.63mm/px · 3 of 137 slices shown]
[im 18/137  lung]
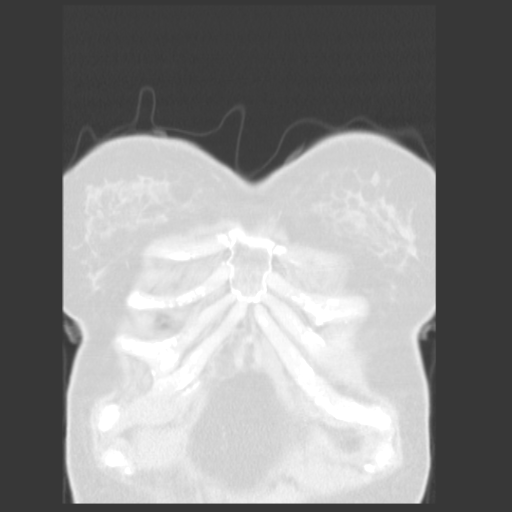
[im 35/137  lung]
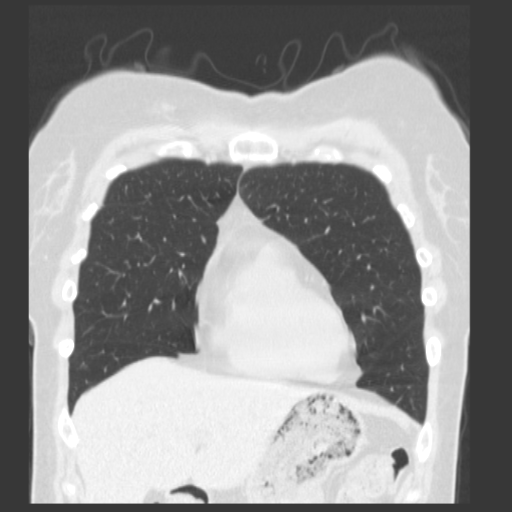
[im 52/137  lung]
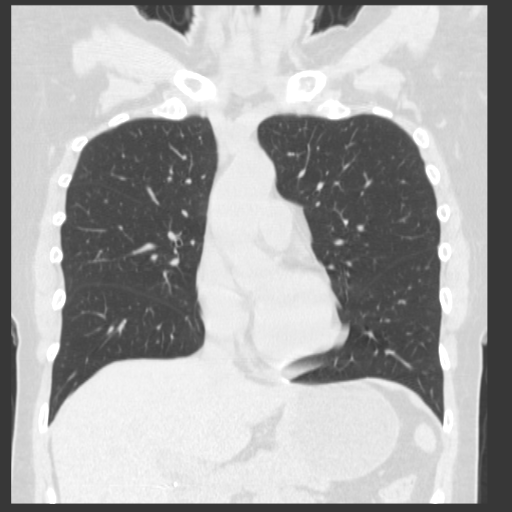

[18 of 30 positions shown; findings below may reference images not displayed]

FINDINGS: High density ingested capsule in the dependent portion of the
stomach. Focal costal cartilage calcification at the anterior aspect
of the left sixth rib. This corresponds to the nodular density seen
on the recent chest radiograph.

There is a 3 mm noncalcified nodule in the left lower lobe on image
number 31, not visible on the recent radiographs. There is also a
tiny left lower lobe nodule on image number 34 and a tiny peripheral
left lower lobe nodule on image number 36. There is a fourth tiny
left lower lobe nodule anteriorly on image number 34 is well. There
is a similar area in the left lower lobe on image number 42. There
is a 3 mm nodule in the right lower lobe on image number 30. There
is a similar area in the inferior aspect of the right upper lobe on
image number 25. Similar nodule in the right upper lobe on image
number 21 and tiny nodule in the right upper lobe on image 14.

No enlarged lymph nodes. Cholecystectomy clips. Mild thoracic spine
degenerative changes.
IMPRESSION: 1. Small bilateral lung nodules, as described above. If the patient
is at high risk for bronchogenic carcinoma, follow-up chest CT at 1
year is recommended. If the patient is at low risk, no follow-up is
needed. This recommendation follows the consensus statement:
Guidelines for Management of Small Pulmonary Nodules Detected on CT
Scans: A Statement from the [HOSPITAL] as published in
2. The recently suspected 5 mm nodule at the left lung base is a
costal cartilage calcification.

## 2015-05-08 DIAGNOSIS — M5432 Sciatica, left side: Secondary | ICD-10-CM | POA: Diagnosis not present

## 2015-05-08 DIAGNOSIS — M5431 Sciatica, right side: Secondary | ICD-10-CM | POA: Diagnosis not present

## 2015-05-08 DIAGNOSIS — M9905 Segmental and somatic dysfunction of pelvic region: Secondary | ICD-10-CM | POA: Diagnosis not present

## 2015-05-08 DIAGNOSIS — M9903 Segmental and somatic dysfunction of lumbar region: Secondary | ICD-10-CM | POA: Diagnosis not present

## 2015-05-11 DIAGNOSIS — M5432 Sciatica, left side: Secondary | ICD-10-CM | POA: Diagnosis not present

## 2015-05-11 DIAGNOSIS — M5431 Sciatica, right side: Secondary | ICD-10-CM | POA: Diagnosis not present

## 2015-05-11 DIAGNOSIS — M9905 Segmental and somatic dysfunction of pelvic region: Secondary | ICD-10-CM | POA: Diagnosis not present

## 2015-05-11 DIAGNOSIS — M9903 Segmental and somatic dysfunction of lumbar region: Secondary | ICD-10-CM | POA: Diagnosis not present

## 2015-05-13 DIAGNOSIS — B373 Candidiasis of vulva and vagina: Secondary | ICD-10-CM | POA: Diagnosis not present

## 2015-05-13 DIAGNOSIS — N952 Postmenopausal atrophic vaginitis: Secondary | ICD-10-CM | POA: Diagnosis not present

## 2015-05-15 DIAGNOSIS — M9905 Segmental and somatic dysfunction of pelvic region: Secondary | ICD-10-CM | POA: Diagnosis not present

## 2015-05-15 DIAGNOSIS — M9903 Segmental and somatic dysfunction of lumbar region: Secondary | ICD-10-CM | POA: Diagnosis not present

## 2015-05-15 DIAGNOSIS — M5431 Sciatica, right side: Secondary | ICD-10-CM | POA: Diagnosis not present

## 2015-05-15 DIAGNOSIS — M5432 Sciatica, left side: Secondary | ICD-10-CM | POA: Diagnosis not present

## 2015-05-18 DIAGNOSIS — M5431 Sciatica, right side: Secondary | ICD-10-CM | POA: Diagnosis not present

## 2015-05-18 DIAGNOSIS — M9903 Segmental and somatic dysfunction of lumbar region: Secondary | ICD-10-CM | POA: Diagnosis not present

## 2015-05-18 DIAGNOSIS — M9905 Segmental and somatic dysfunction of pelvic region: Secondary | ICD-10-CM | POA: Diagnosis not present

## 2015-05-18 DIAGNOSIS — M5432 Sciatica, left side: Secondary | ICD-10-CM | POA: Diagnosis not present

## 2015-05-21 DIAGNOSIS — M5431 Sciatica, right side: Secondary | ICD-10-CM | POA: Diagnosis not present

## 2015-05-21 DIAGNOSIS — M9905 Segmental and somatic dysfunction of pelvic region: Secondary | ICD-10-CM | POA: Diagnosis not present

## 2015-05-21 DIAGNOSIS — M9903 Segmental and somatic dysfunction of lumbar region: Secondary | ICD-10-CM | POA: Diagnosis not present

## 2015-05-21 DIAGNOSIS — M5432 Sciatica, left side: Secondary | ICD-10-CM | POA: Diagnosis not present

## 2015-05-26 DIAGNOSIS — M5432 Sciatica, left side: Secondary | ICD-10-CM | POA: Diagnosis not present

## 2015-05-26 DIAGNOSIS — M9905 Segmental and somatic dysfunction of pelvic region: Secondary | ICD-10-CM | POA: Diagnosis not present

## 2015-05-26 DIAGNOSIS — M5431 Sciatica, right side: Secondary | ICD-10-CM | POA: Diagnosis not present

## 2015-05-26 DIAGNOSIS — M9903 Segmental and somatic dysfunction of lumbar region: Secondary | ICD-10-CM | POA: Diagnosis not present

## 2015-05-29 DIAGNOSIS — M9905 Segmental and somatic dysfunction of pelvic region: Secondary | ICD-10-CM | POA: Diagnosis not present

## 2015-05-29 DIAGNOSIS — M9903 Segmental and somatic dysfunction of lumbar region: Secondary | ICD-10-CM | POA: Diagnosis not present

## 2015-05-29 DIAGNOSIS — M5432 Sciatica, left side: Secondary | ICD-10-CM | POA: Diagnosis not present

## 2015-05-29 DIAGNOSIS — M5431 Sciatica, right side: Secondary | ICD-10-CM | POA: Diagnosis not present

## 2015-06-02 DIAGNOSIS — M5431 Sciatica, right side: Secondary | ICD-10-CM | POA: Diagnosis not present

## 2015-06-02 DIAGNOSIS — M9903 Segmental and somatic dysfunction of lumbar region: Secondary | ICD-10-CM | POA: Diagnosis not present

## 2015-06-02 DIAGNOSIS — M9905 Segmental and somatic dysfunction of pelvic region: Secondary | ICD-10-CM | POA: Diagnosis not present

## 2015-06-02 DIAGNOSIS — M5432 Sciatica, left side: Secondary | ICD-10-CM | POA: Diagnosis not present

## 2015-06-05 DIAGNOSIS — M9905 Segmental and somatic dysfunction of pelvic region: Secondary | ICD-10-CM | POA: Diagnosis not present

## 2015-06-05 DIAGNOSIS — M5431 Sciatica, right side: Secondary | ICD-10-CM | POA: Diagnosis not present

## 2015-06-05 DIAGNOSIS — M5432 Sciatica, left side: Secondary | ICD-10-CM | POA: Diagnosis not present

## 2015-06-05 DIAGNOSIS — M9903 Segmental and somatic dysfunction of lumbar region: Secondary | ICD-10-CM | POA: Diagnosis not present

## 2015-06-09 DIAGNOSIS — Z1231 Encounter for screening mammogram for malignant neoplasm of breast: Secondary | ICD-10-CM | POA: Diagnosis not present

## 2015-06-09 LAB — HM MAMMOGRAPHY: HM Mammogram: NORMAL

## 2015-06-10 DIAGNOSIS — M9905 Segmental and somatic dysfunction of pelvic region: Secondary | ICD-10-CM | POA: Diagnosis not present

## 2015-06-10 DIAGNOSIS — M5431 Sciatica, right side: Secondary | ICD-10-CM | POA: Diagnosis not present

## 2015-06-10 DIAGNOSIS — M5432 Sciatica, left side: Secondary | ICD-10-CM | POA: Diagnosis not present

## 2015-06-10 DIAGNOSIS — M9903 Segmental and somatic dysfunction of lumbar region: Secondary | ICD-10-CM | POA: Diagnosis not present

## 2015-06-16 DIAGNOSIS — M5432 Sciatica, left side: Secondary | ICD-10-CM | POA: Diagnosis not present

## 2015-06-16 DIAGNOSIS — M5431 Sciatica, right side: Secondary | ICD-10-CM | POA: Diagnosis not present

## 2015-06-16 DIAGNOSIS — M9905 Segmental and somatic dysfunction of pelvic region: Secondary | ICD-10-CM | POA: Diagnosis not present

## 2015-06-16 DIAGNOSIS — M9903 Segmental and somatic dysfunction of lumbar region: Secondary | ICD-10-CM | POA: Diagnosis not present

## 2015-06-19 DIAGNOSIS — M5431 Sciatica, right side: Secondary | ICD-10-CM | POA: Diagnosis not present

## 2015-06-19 DIAGNOSIS — M9905 Segmental and somatic dysfunction of pelvic region: Secondary | ICD-10-CM | POA: Diagnosis not present

## 2015-06-19 DIAGNOSIS — M5432 Sciatica, left side: Secondary | ICD-10-CM | POA: Diagnosis not present

## 2015-06-19 DIAGNOSIS — M9903 Segmental and somatic dysfunction of lumbar region: Secondary | ICD-10-CM | POA: Diagnosis not present

## 2015-06-23 DIAGNOSIS — M5432 Sciatica, left side: Secondary | ICD-10-CM | POA: Diagnosis not present

## 2015-06-23 DIAGNOSIS — M5431 Sciatica, right side: Secondary | ICD-10-CM | POA: Diagnosis not present

## 2015-06-23 DIAGNOSIS — M9905 Segmental and somatic dysfunction of pelvic region: Secondary | ICD-10-CM | POA: Diagnosis not present

## 2015-06-23 DIAGNOSIS — M9903 Segmental and somatic dysfunction of lumbar region: Secondary | ICD-10-CM | POA: Diagnosis not present

## 2015-06-24 ENCOUNTER — Encounter: Payer: Self-pay | Admitting: Family Medicine

## 2015-06-30 DIAGNOSIS — M9905 Segmental and somatic dysfunction of pelvic region: Secondary | ICD-10-CM | POA: Diagnosis not present

## 2015-06-30 DIAGNOSIS — M5432 Sciatica, left side: Secondary | ICD-10-CM | POA: Diagnosis not present

## 2015-06-30 DIAGNOSIS — M5431 Sciatica, right side: Secondary | ICD-10-CM | POA: Diagnosis not present

## 2015-06-30 DIAGNOSIS — M9903 Segmental and somatic dysfunction of lumbar region: Secondary | ICD-10-CM | POA: Diagnosis not present

## 2015-07-03 ENCOUNTER — Encounter: Payer: Self-pay | Admitting: *Deleted

## 2015-08-04 ENCOUNTER — Other Ambulatory Visit: Payer: Self-pay | Admitting: Physician Assistant

## 2015-08-07 ENCOUNTER — Encounter: Payer: Self-pay | Admitting: Physician Assistant

## 2015-08-07 ENCOUNTER — Other Ambulatory Visit: Payer: Self-pay | Admitting: Physician Assistant

## 2015-08-19 ENCOUNTER — Other Ambulatory Visit: Payer: Self-pay | Admitting: Physician Assistant

## 2015-08-27 ENCOUNTER — Other Ambulatory Visit: Payer: Self-pay | Admitting: Family Medicine

## 2015-08-31 DIAGNOSIS — H1045 Other chronic allergic conjunctivitis: Secondary | ICD-10-CM | POA: Diagnosis not present

## 2015-09-03 ENCOUNTER — Encounter: Payer: Self-pay | Admitting: Family Medicine

## 2015-09-03 ENCOUNTER — Ambulatory Visit (INDEPENDENT_AMBULATORY_CARE_PROVIDER_SITE_OTHER): Payer: Medicare Other | Admitting: Family Medicine

## 2015-09-03 VITALS — BP 140/72 | HR 73 | Wt 134.0 lb

## 2015-09-03 DIAGNOSIS — Z0000 Encounter for general adult medical examination without abnormal findings: Secondary | ICD-10-CM | POA: Diagnosis not present

## 2015-09-03 DIAGNOSIS — R002 Palpitations: Secondary | ICD-10-CM

## 2015-09-03 DIAGNOSIS — R030 Elevated blood-pressure reading, without diagnosis of hypertension: Secondary | ICD-10-CM

## 2015-09-03 DIAGNOSIS — Z114 Encounter for screening for human immunodeficiency virus [HIV]: Secondary | ICD-10-CM

## 2015-09-03 DIAGNOSIS — IMO0001 Reserved for inherently not codable concepts without codable children: Secondary | ICD-10-CM

## 2015-09-03 DIAGNOSIS — E785 Hyperlipidemia, unspecified: Secondary | ICD-10-CM | POA: Diagnosis not present

## 2015-09-03 DIAGNOSIS — J309 Allergic rhinitis, unspecified: Secondary | ICD-10-CM

## 2015-09-03 NOTE — Progress Notes (Signed)
Subjective:    Breanna Casey is a 59 y.o. female who presents for Medicare Annual/Subsequent preventive examination.  Preventive Screening-Counseling & Management  Tobacco History  Smoking status  . Never Smoker   Smokeless tobacco  . Not on file     Problems Prior to Visit 1.  She does complain of intermittent palpitations. They last anywhere from about 10 seconds to 2 minutes at the most. He can happen multiple times a day but then she can also go a week without it happening. She hasn't really pinpointed any triggers per se. She doesn't get any chest pain when it happens. And she's never gotten dizzy or pass out with it. It's been going on for several months , if not longer. She has a blood pressure cuff at home and when this happens and she checks her pressure it seems to be okay but the machine reads that her heart rate is irregular so she is concerned.  Current Problems (verified) Patient Active Problem List   Diagnosis Date Noted  . Nodule of left lung 03/23/2015  . Colicky RUQ abdominal pain 03/22/2015  . Metallic taste 19/62/2297  . Rosacea 02/04/2015  . Chest pain, atypical 09/12/2014  . Hyperlipidemia 09/12/2014  . Chest pain 10/20/2011  . Mitral valve prolapse 10/20/2011  . Venous stasis 08/31/2011  . Fibromyalgia 04/19/2011  . Allergic rhinitis 04/19/2011  . ASTHMA, UNSPECIFIED, UNSPECIFIED STATUS 11/30/2010  . White coat hypertension 11/05/2010    Medications Prior to Visit Current Outpatient Prescriptions on File Prior to Visit  Medication Sig Dispense Refill  . Cholecalciferol (VITAMIN D) 2000 UNITS CAPS Take 4,000 Units by mouth daily.    . Coenzyme Q10 (CO Q 10 PO) Take by mouth.      . DIGESTIVE ENZYMES PO Take by mouth.      Marland Kitchen FEVERFEW PO Take by mouth.      . fish oil-omega-3 fatty acids 1000 MG capsule Take 2 g by mouth daily.      . Magnesium 200 MG TABS Take by mouth.      . Probiotic Product (ACIDOPHILUS) 90-25 MG CHEW Chew 1 tablet by mouth  daily.    . Red Yeast Rice Extract (RED YEAST RICE PO) Take by mouth.      . traMADol (ULTRAM) 50 MG tablet TAKE ONE TABLET BY MOUTH EVERY SIX HOURS AS NEEDED. 120 tablet 5   No current facility-administered medications on file prior to visit.    Current Medications (verified) Current Outpatient Prescriptions  Medication Sig Dispense Refill  . Cholecalciferol (VITAMIN D) 2000 UNITS CAPS Take 4,000 Units by mouth daily.    . Coenzyme Q10 (CO Q 10 PO) Take by mouth.      . DIGESTIVE ENZYMES PO Take by mouth.      Marland Kitchen FEVERFEW PO Take by mouth.      . fish oil-omega-3 fatty acids 1000 MG capsule Take 2 g by mouth daily.      Marland Kitchen FLOVENT HFA 110 MCG/ACT inhaler INHALE 2 PUFFS INTO THE LUNGS 2 (TWO) TIMES DAILY.  5  . Magnesium 200 MG TABS Take by mouth.      . Probiotic Product (ACIDOPHILUS) 90-25 MG CHEW Chew 1 tablet by mouth daily.    . Red Yeast Rice Extract (RED YEAST RICE PO) Take by mouth.      . traMADol (ULTRAM) 50 MG tablet TAKE ONE TABLET BY MOUTH EVERY SIX HOURS AS NEEDED. 120 tablet 5   No current facility-administered medications for this visit.  Allergies (verified) Cefdinir; Glycopyrrolate; Meperidine hcl; Butalbital-aspirin-caffeine; Butorphanol tartrate; Cimetidine; Ciprofloxacin; Clarithromycin; Codeine sulfate; Doxycycline; Eggs or egg-derived products; Erythromycin; Flagyl; Hydrochlorothiazide; Hydrochlorothiazide w-triamterene; Hydrocodone-acetaminophen; Ibuprofen; Indomethacin; Ketorolac tromethamine; Latex; Meprobamate; Naproxen; Nexium; Penicillins; Pentazocine lactate; Propantheline bromide; Propoxyphene hcl; Propoxyphene n-acetaminophen; Sucralfate; Sulfonamide derivatives; Tetracycline; and Butalbital-asa-caff-codeine   PAST HISTORY  Family History Family History  Problem Relation Age of Onset  . Kidney cancer Father 37    Deceased  . Hyperlipidemia Mother   . Hypertension Mother   . Hyperlipidemia Brother   . Hypertension Brother     Social  History Social History  Substance Use Topics  . Smoking status: Never Smoker   . Smokeless tobacco: Not on file  . Alcohol Use: No     Are there smokers in your home (other than you)? No  Risk Factors Current exercise habits: walks some  Dietary issues discussed: None   Cardiac risk factors: none.  Depression Screen (Note: if answer to either of the following is "Yes", a more complete depression screening is indicated)   Over the past two weeks, have you felt down, depressed or hopeless? No  Over the past two weeks, have you felt little interest or pleasure in doing things? No  Have you lost interest or pleasure in daily life? No  Do you often feel hopeless? No  Do you cry easily over simple problems? No  Activities of Daily Living In your present state of health, do you have any difficulty performing the following activities?:  Driving? No Managing money?  No Feeding yourself? No Getting from bed to chair? No   Climbing a flight of stairs? No Preparing food and eating?: No Bathing or showering? No Getting dressed: No Getting to the toilet? No Using the toilet:No Moving around from place to place: No In the past year have you fallen or had a near fall?:Yes   Hearing Difficulties: No Do you often ask people to speak up or repeat themselves? No Do you experience ringing or noises in your ears? Yes Do you have difficulty understanding soft or whispered voices? No   Do you feel that you have a problem with memory? No  Do you often misplace items? No  Do you feel safe at home?  Yes  Cognitive Testing  Alert? Yes  Normal Appearance?Yes  Oriented to person? Yes  Place? Yes   Time? Yes  Recall of three objects?  Yes  Can perform simple calculations? Yes  Displays appropriate judgment?Yes  Can read the correct time from a watch face?Yes   Advanced Directives have been discussed with the patient? Yes  List the Names of Other Physician/Practitioners you currently  use: 1.   Dr. Abran Duke, gynecology  #2 Dr. Delle Reining , gastroenterology  #3 Dr. Cecilie Lowers, chiropractor  Indicate any recent Medical Services you may have received from other than [facility] providers in the past year (date may be approximate).  Immunization History  Administered Date(s) Administered  . Pneumococcal Polysaccharide-23 11/30/2010    Screening Tests Health Maintenance  Topic Date Due  . HIV Screening  08/27/1971  . PAP SMEAR  05/05/2014  . COLONOSCOPY  12/06/2015  . TETANUS/TDAP  12/05/2016  . MAMMOGRAM  06/08/2017  . Hepatitis C Screening  Completed    All answers were reviewed with the patient and necessary referrals were made:  METHENEY,CATHERINE, MD   09/03/2015   History reviewed: allergies, current medications, past family history, past medical history, past social history, past surgical history and problem list  Review of Systems Pertinent  items noted in HPI and remainder of comprehensive ROS otherwise negative.    Objective:     Vision by Snellen chart: will call [facility] here in [place] for eye report.    Body mass index is 22.3 kg/(m^2). BP 170/72 mmHg  Pulse 73  Wt 134 lb (60.782 kg)  BP 170/72 mmHg  Pulse 73  Wt 134 lb (60.782 kg)  General appearance: alert, cooperative and appears stated age Head: Normocephalic, without obvious abnormality, atraumatic Eyes: conj clear, EOMI, PEERLA Ears: normal TM's and external ear canals both ears Nose: Nares normal. Septum midline. Mucosa normal. No drainage or sinus tenderness. Throat: lips, mucosa, and tongue normal; teeth and gums normal Neck: no adenopathy, no carotid bruit, no JVD, supple, symmetrical, trachea midline and thyroid not enlarged, symmetric, no tenderness/mass/nodules Back: symmetric, no curvature. ROM normal. No CVA tenderness. Lungs: clear to auscultation bilaterally Heart: regular rate and rhythm, S1, S2 normal, no murmur, click, rub or gallop Abdomen: soft, non-tender; bowel sounds  normal; no masses,  no organomegaly Extremities: extremities normal, atraumatic, no cyanosis or edema Pulses: 2+ and symmetric Skin: Skin color, texture, turgor normal. No rashes or lesions Lymph nodes: Cervical, supraclavicular, and axillary nodes normal. Neurologic: Alert and oriented X 3, normal strength and tone. Normal symmetric reflexes. Normal coordination and gait     Assessment:      annual Medicare wellness exam      Plan:     During the course of the visit the patient was educated and counseled about appropriate screening and preventive services including:    Influenza vaccine  - Egg allergy.   Had pap done in June.  Mammo done in June.    Will call to get colonscopy report from 2012 from [place].    Whitecoat hypertension-repeat blood pressure today was much better but still a little borderline we will need to keep an eye on this.   Palpitations- EKG performed today. EKG shows rate of 71 bpm, normal sinus rhythm with no acute ST-T wave changes. We'll schedule her for a 30 day cardiac monitor. Her description is most consistent with palpitations but will rule out an arrhythmia.  Diet review for nutrition referral? Yes ____  Not Indicated _X__   Patient Instructions (the written plan) was given to the patient.  Medicare Attestation I have personally reviewed: The patient's medical and social history Their use of alcohol, tobacco or illicit drugs Their current medications and supplements The patient's functional ability including ADLs,fall risks, home safety risks, cognitive, and hearing and visual impairment Diet and physical activities Evidence for depression or mood disorders  The patient's weight, height, BMI, and visual acuity have been recorded in the chart.  I have made referrals, counseling, and provided education to the patient based on review of the above and I have provided the patient with a written personalized care plan for preventive  services.     METHENEY,CATHERINE, MD   09/03/2015

## 2015-09-04 ENCOUNTER — Encounter: Payer: Self-pay | Admitting: Family Medicine

## 2015-09-07 ENCOUNTER — Other Ambulatory Visit: Payer: Self-pay | Admitting: Family Medicine

## 2015-09-07 DIAGNOSIS — R002 Palpitations: Secondary | ICD-10-CM

## 2015-09-10 ENCOUNTER — Ambulatory Visit (INDEPENDENT_AMBULATORY_CARE_PROVIDER_SITE_OTHER): Payer: Medicare Other

## 2015-09-10 DIAGNOSIS — R002 Palpitations: Secondary | ICD-10-CM

## 2015-09-29 ENCOUNTER — Encounter: Payer: Self-pay | Admitting: Family Medicine

## 2015-10-06 ENCOUNTER — Encounter: Payer: Self-pay | Admitting: Family Medicine

## 2015-10-13 ENCOUNTER — Encounter: Payer: Self-pay | Admitting: *Deleted

## 2015-10-13 NOTE — Progress Notes (Signed)
Patient ID: Breanna Casey, female   DOB: 1956-10-30, 59 y.o.   MRN: [id number] 30 day cardiac event monitor extended till 10/17/2015.

## 2015-10-19 ENCOUNTER — Other Ambulatory Visit: Payer: Self-pay | Admitting: Family Medicine

## 2015-10-27 ENCOUNTER — Encounter: Payer: Self-pay | Admitting: Family Medicine

## 2015-10-28 ENCOUNTER — Other Ambulatory Visit: Payer: Self-pay | Admitting: Family Medicine

## 2015-10-28 DIAGNOSIS — Z114 Encounter for screening for human immunodeficiency virus [HIV]: Secondary | ICD-10-CM | POA: Diagnosis not present

## 2015-10-28 DIAGNOSIS — R252 Cramp and spasm: Secondary | ICD-10-CM

## 2015-10-28 DIAGNOSIS — Z0000 Encounter for general adult medical examination without abnormal findings: Secondary | ICD-10-CM | POA: Diagnosis not present

## 2015-10-28 DIAGNOSIS — E785 Hyperlipidemia, unspecified: Secondary | ICD-10-CM | POA: Diagnosis not present

## 2015-10-28 DIAGNOSIS — Z1159 Encounter for screening for other viral diseases: Secondary | ICD-10-CM | POA: Diagnosis not present

## 2015-10-28 DIAGNOSIS — R002 Palpitations: Secondary | ICD-10-CM | POA: Diagnosis not present

## 2015-10-29 LAB — CBC WITH DIFFERENTIAL/PLATELET
Basophils Absolute: 0.1 10*3/uL (ref 0.0–0.1)
Basophils Relative: 1 % (ref 0–1)
Eosinophils Absolute: 0.1 10*3/uL (ref 0.0–0.7)
Eosinophils Relative: 1 % (ref 0–5)
HCT: 46 % (ref 36.0–46.0)
Hemoglobin: 15.4 g/dL — ABNORMAL HIGH (ref 12.0–15.0)
Lymphocytes Relative: 39 % (ref 12–46)
Lymphs Abs: 2 10*3/uL (ref 0.7–4.0)
MCH: 29.2 pg (ref 26.0–34.0)
MCHC: 33.5 g/dL (ref 30.0–36.0)
MCV: 87.1 fL (ref 78.0–100.0)
MPV: 9.9 fL (ref 8.6–12.4)
Monocytes Absolute: 0.4 10*3/uL (ref 0.1–1.0)
Monocytes Relative: 8 % (ref 3–12)
Neutro Abs: 2.6 10*3/uL (ref 1.7–7.7)
Neutrophils Relative %: 51 % (ref 43–77)
Platelets: 262 10*3/uL (ref 150–400)
RBC: 5.28 MIL/uL — ABNORMAL HIGH (ref 3.87–5.11)
RDW: 13.1 % (ref 11.5–15.5)
WBC: 5 10*3/uL (ref 4.0–10.5)

## 2015-10-29 LAB — COMPLETE METABOLIC PANEL WITH GFR
ALT: 24 U/L (ref 6–29)
AST: 24 U/L (ref 10–35)
Albumin: 4.5 g/dL (ref 3.6–5.1)
Alkaline Phosphatase: 75 U/L (ref 33–130)
BUN: 4 mg/dL — ABNORMAL LOW (ref 7–25)
CO2: 29 mmol/L (ref 20–31)
Calcium: 9.6 mg/dL (ref 8.6–10.4)
Chloride: 100 mmol/L (ref 98–110)
Creat: 0.66 mg/dL (ref 0.50–1.05)
GFR, Est African American: >89 mL/min (ref >=60)
GFR, Est Non African American: >89 mL/min (ref >=60)
Glucose, Bld: 77 mg/dL (ref 65–99)
Potassium: 4.6 mmol/L (ref 3.5–5.3)
Sodium: 139 mmol/L (ref 135–146)
Total Bilirubin: 0.6 mg/dL (ref 0.2–1.2)
Total Protein: 7.2 g/dL (ref 6.1–8.1)

## 2015-10-29 LAB — MAGNESIUM: Magnesium: 2 mg/dL (ref 1.5–2.5)

## 2015-10-29 LAB — LIPID PANEL
Cholesterol: 218 mg/dL — ABNORMAL HIGH (ref 125–200)
HDL: 91 mg/dL (ref >=46)
LDL Cholesterol: 113 mg/dL (ref <130)
Total CHOL/HDL Ratio: 2.4 Ratio (ref <=5.0)
Triglycerides: 72 mg/dL (ref <150)
VLDL: 14 mg/dL (ref <30)

## 2015-10-29 LAB — HIV ANTIBODY (ROUTINE TESTING W REFLEX): HIV 1&2 Ab, 4th Generation: NONREACTIVE

## 2015-10-29 LAB — TSH: TSH: 2.723 u[IU]/mL (ref 0.350–4.500)

## 2015-11-02 ENCOUNTER — Other Ambulatory Visit: Payer: Self-pay

## 2015-11-02 DIAGNOSIS — D582 Other hemoglobinopathies: Secondary | ICD-10-CM

## 2015-11-02 DIAGNOSIS — R7989 Other specified abnormal findings of blood chemistry: Secondary | ICD-10-CM

## 2015-11-02 DIAGNOSIS — R718 Other abnormality of red blood cells: Secondary | ICD-10-CM

## 2015-11-03 ENCOUNTER — Other Ambulatory Visit: Payer: Self-pay

## 2015-11-03 ENCOUNTER — Encounter: Payer: Self-pay | Admitting: Family Medicine

## 2015-11-03 DIAGNOSIS — R000 Tachycardia, unspecified: Secondary | ICD-10-CM

## 2015-11-03 DIAGNOSIS — I472 Ventricular tachycardia: Secondary | ICD-10-CM

## 2015-11-03 MED ORDER — BUDESONIDE 90 MCG/ACT IN AEPB: 2.0000 | INHALATION_SPRAY | Freq: Two times a day (BID) | RESPIRATORY_TRACT | Status: DC

## 2015-11-05 ENCOUNTER — Other Ambulatory Visit: Payer: Self-pay

## 2015-11-05 MED ORDER — FLOVENT HFA 110 MCG/ACT IN AERO: INHALATION_SPRAY | RESPIRATORY_TRACT | Status: DC

## 2015-11-09 NOTE — Telephone Encounter (Signed)
We placed your referral to let us know if you have any problems.

## 2015-11-16 ENCOUNTER — Ambulatory Visit (INDEPENDENT_AMBULATORY_CARE_PROVIDER_SITE_OTHER): Payer: Medicare Other | Admitting: Physician Assistant

## 2015-11-16 ENCOUNTER — Encounter: Payer: Self-pay | Admitting: Physician Assistant

## 2015-11-16 VITALS — BP 144/90 | HR 67 | Ht 65.5 in | Wt 136.2 lb

## 2015-11-16 DIAGNOSIS — R0789 Other chest pain: Secondary | ICD-10-CM

## 2015-11-16 DIAGNOSIS — R079 Chest pain, unspecified: Secondary | ICD-10-CM | POA: Diagnosis not present

## 2015-11-16 DIAGNOSIS — I4729 Other ventricular tachycardia: Secondary | ICD-10-CM

## 2015-11-16 DIAGNOSIS — I472 Ventricular tachycardia: Secondary | ICD-10-CM | POA: Diagnosis not present

## 2015-11-16 MED ORDER — METOPROLOL TARTRATE 25 MG PO TABS: 25.0000 mg | ORAL_TABLET | Freq: Two times a day (BID) | ORAL | Status: DC

## 2015-11-16 NOTE — Patient Instructions (Addendum)
Your physician has recommended you make the following change in your medication: start  New prescription for lopressor 25 mg ( metoprolol) as directed on the bottle. This has [facility].  Your physician has requested that you have en exercise stress myoview. For further information please visit [email or web address]. Please follow instruction sheet, as given.  Your physician recommends that you schedule a follow-up appointment in: 1 month with Tarri Fuller.
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
## 2015-11-19 ENCOUNTER — Telehealth (HOSPITAL_COMMUNITY): Payer: Self-pay

## 2015-11-19 NOTE — Telephone Encounter (Signed)
Encounter complete. 

## 2015-11-20 ENCOUNTER — Telehealth (HOSPITAL_COMMUNITY): Payer: Self-pay

## 2015-11-20 NOTE — Telephone Encounter (Signed)
Encounter complete. 

## 2015-11-24 ENCOUNTER — Ambulatory Visit (HOSPITAL_COMMUNITY)
Admission: RE | Admit: 2015-11-24 | Discharge: 2015-11-24 | Disposition: A | Discharge status: Home / Self Care | Payer: Medicare Other | Source: Ambulatory Visit | Attending: Cardiology | Admitting: Cardiology

## 2015-11-24 ENCOUNTER — Telehealth (HOSPITAL_COMMUNITY): Payer: Self-pay

## 2015-11-24 DIAGNOSIS — R002 Palpitations: Secondary | ICD-10-CM | POA: Insufficient documentation

## 2015-11-24 DIAGNOSIS — R079 Chest pain, unspecified: Secondary | ICD-10-CM | POA: Diagnosis not present

## 2015-11-24 LAB — MYOCARDIAL PERFUSION IMAGING
Estimated workload: 7.7 METS
Exercise duration (min): 4 min
Exercise duration (sec): 3 s
LV dias vol: 61 mL
LV sys vol: 20 mL
MPHR: 161 {beats}/min
Peak BP: 209 mmHg
Peak HR: 157 {beats}/min
Percent HR: 97 %
Percent of predicted max HR: 97 %
RPE: 17
Rest HR: 67 {beats}/min
SDS: 1
SRS: 0
SSS: 1
Stage 1 DBP: 93 mmHg
Stage 1 Grade: 0 %
Stage 1 HR: 83 {beats}/min
Stage 1 SBP: 169 mmHg
Stage 1 Speed: 0 mph
Stage 2 HR: 79 {beats}/min
Stage 3 HR: 78 {beats}/min
Stage 4 Grade: 14 %
Stage 4 HR: 153 {beats}/min
Stage 4 Speed: 3.4 mph
Stage 5 DBP: 108 mmHg
Stage 5 Grade: 14 %
Stage 5 HR: 157 {beats}/min
Stage 5 SBP: 209 mmHg
Stage 5 Speed: 2.5 mph
Stage 6 DBP: 103 mmHg
Stage 6 Grade: 0 %
Stage 6 HR: 129 {beats}/min
Stage 6 SBP: 201 mmHg
Stage 6 Speed: 0 mph
Stage 7 DBP: 90 mmHg
Stage 7 Grade: 0 %
Stage 7 HR: 78 {beats}/min
Stage 7 SBP: 140 mmHg
Stage 7 Speed: 0 mph
TID: 1.05

## 2015-11-24 MED ORDER — TECHNETIUM TC 99M SESTAMIBI GENERIC - CARDIOLITE
10.5000 | Freq: Once | INTRAVENOUS | Status: AC | PRN
  Administered 2015-11-24: 10.5 via INTRAVENOUS

## 2015-11-24 MED ORDER — TECHNETIUM TC 99M SESTAMIBI GENERIC - CARDIOLITE
32.4000 | Freq: Once | INTRAVENOUS | Status: AC | PRN
  Administered 2015-11-24: 32.4 via INTRAVENOUS

## 2015-11-24 NOTE — Telephone Encounter (Signed)
Encounter complete. 

## 2015-11-25 ENCOUNTER — Telehealth: Payer: Self-pay | Admitting: Cardiology

## 2015-11-25 ENCOUNTER — Encounter: Payer: Self-pay | Admitting: Physician Assistant

## 2015-11-25 NOTE — Telephone Encounter (Signed)
New Message  Please call back to discuss Myoview results.

## 2015-11-25 NOTE — Telephone Encounter (Signed)
PHONE LINE BUSY. TRY AGAIN

## 2015-11-26 ENCOUNTER — Other Ambulatory Visit: Payer: Self-pay

## 2015-11-26 DIAGNOSIS — D582 Other hemoglobinopathies: Secondary | ICD-10-CM

## 2015-11-26 DIAGNOSIS — R718 Other abnormality of red blood cells: Secondary | ICD-10-CM

## 2015-11-26 DIAGNOSIS — R7989 Other specified abnormal findings of blood chemistry: Secondary | ICD-10-CM

## 2015-11-26 DIAGNOSIS — R799 Abnormal finding of blood chemistry, unspecified: Secondary | ICD-10-CM | POA: Diagnosis not present

## 2015-11-27 LAB — CBC
HCT: 43.6 % (ref 36.0–46.0)
Hemoglobin: 14.7 g/dL (ref 12.0–15.0)
MCH: 29.5 pg (ref 26.0–34.0)
MCHC: 33.7 g/dL (ref 30.0–36.0)
MCV: 87.6 fL (ref 78.0–100.0)
MPV: 10.1 fL (ref 8.6–12.4)
Platelets: 231 10*3/uL (ref 150–400)
RBC: 4.98 MIL/uL (ref 3.87–5.11)
RDW: 12.9 % (ref 11.5–15.5)
WBC: 5.5 10*3/uL (ref 4.0–10.5)

## 2015-11-27 NOTE — Telephone Encounter (Signed)
Answered by my[facility]

## 2015-11-30 ENCOUNTER — Encounter: Payer: Self-pay | Admitting: Family Medicine

## 2015-12-10 ENCOUNTER — Encounter: Payer: Self-pay | Admitting: Family Medicine

## 2015-12-10 MED ORDER — BUDESONIDE 180 MCG/ACT IN AEPB: 1.0000 | INHALATION_SPRAY | Freq: Two times a day (BID) | RESPIRATORY_TRACT | Status: DC

## 2015-12-11 ENCOUNTER — Encounter: Payer: Self-pay | Admitting: Family Medicine

## 2015-12-16 ENCOUNTER — Encounter: Payer: Self-pay | Admitting: Family Medicine

## 2015-12-24 ENCOUNTER — Encounter: Payer: Self-pay | Admitting: Physician Assistant

## 2015-12-24 ENCOUNTER — Other Ambulatory Visit: Payer: Self-pay | Admitting: Family Medicine

## 2015-12-24 ENCOUNTER — Ambulatory Visit (INDEPENDENT_AMBULATORY_CARE_PROVIDER_SITE_OTHER): Payer: Medicare Other | Admitting: Physician Assistant

## 2015-12-24 VITALS — BP 144/82 | HR 78 | Ht 66.0 in | Wt 136.1 lb

## 2015-12-24 DIAGNOSIS — I472 Ventricular tachycardia: Secondary | ICD-10-CM

## 2015-12-24 DIAGNOSIS — R030 Elevated blood-pressure reading, without diagnosis of hypertension: Secondary | ICD-10-CM

## 2015-12-24 DIAGNOSIS — E785 Hyperlipidemia, unspecified: Secondary | ICD-10-CM

## 2015-12-24 DIAGNOSIS — I4729 Other ventricular tachycardia: Secondary | ICD-10-CM

## 2015-12-24 DIAGNOSIS — IMO0001 Reserved for inherently not codable concepts without codable children: Secondary | ICD-10-CM

## 2015-12-24 MED ORDER — NEBIVOLOL HCL 2.5 MG PO TABS: 2.5000 mg | ORAL_TABLET | Freq: Every day | ORAL | Status: DC

## 2015-12-24 MED ORDER — BECLOMETHASONE DIPROPIONATE 40 MCG/ACT IN AERS: 2.0000 | INHALATION_SPRAY | Freq: Two times a day (BID) | RESPIRATORY_TRACT | Status: DC
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
## 2015-12-24 NOTE — Patient Instructions (Signed)
Your physician has recommended you make the following change in your medication:   You have been given a new prescription for Bystolic.2.5 mg. This replaces the metoprolol prescription that you did not purchase.  Your physician recommends that you schedule a follow-up appointment in: 3 months with Tarri Fuller.

## 2015-12-27 ENCOUNTER — Encounter: Payer: Self-pay | Admitting: Physician Assistant

## 2015-12-31 ENCOUNTER — Other Ambulatory Visit: Payer: Self-pay | Admitting: Family Medicine

## 2016-01-01 ENCOUNTER — Encounter: Payer: Self-pay | Admitting: Family Medicine

## 2016-01-10 ENCOUNTER — Other Ambulatory Visit: Payer: Self-pay | Admitting: Physician Assistant

## 2016-01-11 ENCOUNTER — Encounter: Payer: Self-pay | Admitting: Family Medicine

## 2016-02-08 DIAGNOSIS — M545 Low back pain: Secondary | ICD-10-CM | POA: Diagnosis not present

## 2016-02-08 DIAGNOSIS — M5441 Lumbago with sciatica, right side: Secondary | ICD-10-CM | POA: Diagnosis not present

## 2016-02-08 DIAGNOSIS — M9903 Segmental and somatic dysfunction of lumbar region: Secondary | ICD-10-CM | POA: Diagnosis not present

## 2016-02-08 DIAGNOSIS — M9905 Segmental and somatic dysfunction of pelvic region: Secondary | ICD-10-CM | POA: Diagnosis not present

## 2016-02-08 DIAGNOSIS — M62830 Muscle spasm of back: Secondary | ICD-10-CM | POA: Diagnosis not present

## 2016-02-09 ENCOUNTER — Ambulatory Visit (INDEPENDENT_AMBULATORY_CARE_PROVIDER_SITE_OTHER): Payer: Medicare Other | Admitting: Family Medicine

## 2016-02-09 ENCOUNTER — Encounter: Payer: Self-pay | Admitting: Family Medicine

## 2016-02-09 VITALS — BP 144/76 | HR 61 | Wt 132.0 lb

## 2016-02-09 DIAGNOSIS — R1030 Lower abdominal pain, unspecified: Secondary | ICD-10-CM | POA: Diagnosis not present

## 2016-02-09 DIAGNOSIS — R109 Unspecified abdominal pain: Secondary | ICD-10-CM | POA: Diagnosis not present

## 2016-02-09 NOTE — Patient Instructions (Signed)
Thank you for coming in today. Get labs today.  Return tomorrow with myself or Dr Jerilynn Mages.  Eat a clear liquid diet.  Go to the ER if you get worse.  If your belly pain worsens, or you have high fever, bad vomiting, blood in your stool or black tarry stool go to the Emergency Room.   Diverticulitis Diverticulitis is inflammation or infection of small pouches in your colon that form when you have a condition called diverticulosis. The pouches in your colon are called diverticula. Your colon, or large intestine, is where water is absorbed and stool is formed. Complications of diverticulitis can include:  Bleeding.  Severe infection.  Severe pain.  Perforation of your colon.  Obstruction of your colon. CAUSES  Diverticulitis is caused by bacteria. Diverticulitis happens when stool becomes trapped in diverticula. This allows bacteria to grow in the diverticula, which can lead to inflammation and infection. RISK FACTORS People with diverticulosis are at risk for diverticulitis. Eating a diet that does not include enough fiber from fruits and vegetables may make diverticulitis more likely to develop. SYMPTOMS  Symptoms of diverticulitis may include:  Abdominal pain and tenderness. The pain is normally located on the left side of the abdomen, but may occur in other areas.  Fever and chills.  Bloating.  Cramping.  Nausea.  Vomiting.  Constipation.  Diarrhea.  Blood in your stool. DIAGNOSIS  Your health care provider will ask you about your medical history and do a physical exam. You may need to have tests done because many medical conditions can cause the same symptoms as diverticulitis. Tests may include:  Blood tests.  Urine tests.  Imaging tests of the abdomen, including X-rays and CT scans. When your condition is under control, your health care provider may recommend that you have a colonoscopy. A colonoscopy can show how severe your diverticula are and whether something  else is causing your symptoms. TREATMENT  Most cases of diverticulitis are mild and can be treated at home. Treatment may include:  Taking over-the-counter pain medicines.  Following a clear liquid diet.  Taking antibiotic medicines by mouth for 7-10 days. More severe cases may be treated at a hospital. Treatment may include:  Not eating or drinking.  Taking prescription pain medicine.  Receiving antibiotic medicines through an IV tube.  Receiving fluids and nutrition through an IV tube.  Surgery. HOME CARE INSTRUCTIONS   Follow your health care provider's instructions carefully.  Follow a full liquid diet or other diet as directed by your health care provider. After your symptoms improve, your health care provider may tell you to change your diet. He or she may recommend you eat a high-fiber diet. Fruits and vegetables are good sources of fiber. Fiber makes it easier to pass stool.  Take fiber supplements or probiotics as directed by your health care provider.  Only take medicines as directed by your health care provider.  Keep all your follow-up appointments. SEEK MEDICAL CARE IF:   Your pain does not improve.  You have a hard time eating food.  Your bowel movements do not return to normal. SEEK IMMEDIATE MEDICAL CARE IF:   Your pain becomes worse.  Your symptoms do not get better.  Your symptoms suddenly get worse.  You have a fever.  You have repeated vomiting.  You have bloody or black, tarry stools. MAKE SURE YOU:   Understand these instructions.  Will watch your condition.  Will get help right away if you are not doing well or  get worse.   This information is not intended to replace advice given to you by your health care provider. Make sure you discuss any questions you have with your health care provider.   Document Released: 08/31/2005 Document Revised: 11/26/2013 Document Reviewed: 10/16/2013 Elsevier Interactive Patient Education [employer]

## 2016-02-09 NOTE — Assessment & Plan Note (Signed)
Concerning for diverticulitis. However I think the most likely diagnosis is viral gastroenteritis.  My plan is complicated by multiple drug allergies. I am unable to obtain a CT scan with contrast to diagnosis or use antibiotis to treat for diverticulitis.  Plan: for CBC, CMP and Lipase. F/u tomorrow.
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
## 2016-02-10 LAB — COMPREHENSIVE METABOLIC PANEL
ALT: 15 U/L (ref 6–29)
AST: 16 U/L (ref 10–35)
Albumin: 4.2 g/dL (ref 3.6–5.1)
Alkaline Phosphatase: 68 U/L (ref 33–130)
BUN: 3 mg/dL — ABNORMAL LOW (ref 7–25)
CO2: 26 mmol/L (ref 20–31)
Calcium: 9.6 mg/dL (ref 8.6–10.4)
Chloride: 102 mmol/L (ref 98–110)
Creat: 0.74 mg/dL (ref 0.50–1.05)
Glucose, Bld: 91 mg/dL (ref 65–99)
Potassium: 3.8 mmol/L (ref 3.5–5.3)
Sodium: 139 mmol/L (ref 135–146)
Total Bilirubin: 0.6 mg/dL (ref 0.2–1.2)
Total Protein: 6.7 g/dL (ref 6.1–8.1)

## 2016-02-10 LAB — CBC
HCT: 41.1 % (ref 36.0–46.0)
Hemoglobin: 14 g/dL (ref 12.0–15.0)
MCH: 29.2 pg (ref 26.0–34.0)
MCHC: 34.1 g/dL (ref 30.0–36.0)
MCV: 85.6 fL (ref 78.0–100.0)
MPV: 10.2 fL (ref 8.6–12.4)
Platelets: 218 10*3/uL (ref 150–400)
RBC: 4.8 MIL/uL (ref 3.87–5.11)
RDW: 13.3 % (ref 11.5–15.5)
WBC: 6.1 10*3/uL (ref 4.0–10.5)

## 2016-02-10 LAB — LIPASE: Lipase: 36 U/L (ref 7–60)

## 2016-02-10 NOTE — Progress Notes (Signed)
Quick Note:  Labs look normal ______

## 2016-02-11 ENCOUNTER — Encounter: Payer: Self-pay | Admitting: Family Medicine

## 2016-02-11 ENCOUNTER — Ambulatory Visit (INDEPENDENT_AMBULATORY_CARE_PROVIDER_SITE_OTHER): Payer: Medicare Other | Admitting: Family Medicine

## 2016-02-11 VITALS — BP 160/86 | HR 70 | Wt 133.0 lb

## 2016-02-11 DIAGNOSIS — R1030 Lower abdominal pain, unspecified: Secondary | ICD-10-CM | POA: Diagnosis not present

## 2016-02-11 NOTE — Assessment & Plan Note (Signed)
Continue watchful waiting. Advance diet. Follow-up in about 4 days. Precautions reviewed.
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
## 2016-02-11 NOTE — Patient Instructions (Signed)
Thank you for coming in today. Return on Tuesday with Dr Madilyn Fireman or myself.  Return sooner if needed.  If your belly pain worsens, or you have high fever, bad vomiting, blood in your stool or black tarry stool go to the Emergency Room.

## 2016-02-15 ENCOUNTER — Encounter: Payer: Self-pay | Admitting: Family Medicine

## 2016-02-15 ENCOUNTER — Other Ambulatory Visit: Payer: Self-pay | Admitting: *Deleted

## 2016-02-15 DIAGNOSIS — J3089 Other allergic rhinitis: Secondary | ICD-10-CM

## 2016-02-15 DIAGNOSIS — Z91018 Allergy to other foods: Secondary | ICD-10-CM

## 2016-02-16 ENCOUNTER — Ambulatory Visit (INDEPENDENT_AMBULATORY_CARE_PROVIDER_SITE_OTHER): Payer: Medicare Other | Admitting: Family Medicine

## 2016-02-16 ENCOUNTER — Encounter: Payer: Self-pay | Admitting: Family Medicine

## 2016-02-16 VITALS — BP 150/75 | HR 85 | Wt 131.0 lb

## 2016-02-16 DIAGNOSIS — M62830 Muscle spasm of back: Secondary | ICD-10-CM | POA: Diagnosis not present

## 2016-02-16 DIAGNOSIS — R1030 Lower abdominal pain, unspecified: Secondary | ICD-10-CM | POA: Diagnosis not present

## 2016-02-16 DIAGNOSIS — M9905 Segmental and somatic dysfunction of pelvic region: Secondary | ICD-10-CM | POA: Diagnosis not present

## 2016-02-16 DIAGNOSIS — M5441 Lumbago with sciatica, right side: Secondary | ICD-10-CM | POA: Diagnosis not present

## 2016-02-16 DIAGNOSIS — M9903 Segmental and somatic dysfunction of lumbar region: Secondary | ICD-10-CM | POA: Diagnosis not present

## 2016-02-16 DIAGNOSIS — M545 Low back pain: Secondary | ICD-10-CM | POA: Diagnosis not present

## 2016-02-16 NOTE — Assessment & Plan Note (Signed)
Improving. Continue watchful waiting. F/u with PCP soon.

## 2016-02-16 NOTE — Patient Instructions (Signed)
Thank you for coming in today. Follow up with Dr Madilyn Fireman.  Call or go to the emergency room if you get worse, have trouble breathing, have chest pains, or palpitations.
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
## 2016-02-19 DIAGNOSIS — M62830 Muscle spasm of back: Secondary | ICD-10-CM | POA: Diagnosis not present

## 2016-02-19 DIAGNOSIS — M9903 Segmental and somatic dysfunction of lumbar region: Secondary | ICD-10-CM | POA: Diagnosis not present

## 2016-02-19 DIAGNOSIS — M545 Low back pain: Secondary | ICD-10-CM | POA: Diagnosis not present

## 2016-02-19 DIAGNOSIS — M9905 Segmental and somatic dysfunction of pelvic region: Secondary | ICD-10-CM | POA: Diagnosis not present

## 2016-02-19 DIAGNOSIS — M5441 Lumbago with sciatica, right side: Secondary | ICD-10-CM | POA: Diagnosis not present

## 2016-02-24 ENCOUNTER — Encounter: Payer: Self-pay | Admitting: Family Medicine

## 2016-02-29 DIAGNOSIS — M9903 Segmental and somatic dysfunction of lumbar region: Secondary | ICD-10-CM | POA: Diagnosis not present

## 2016-02-29 DIAGNOSIS — M5441 Lumbago with sciatica, right side: Secondary | ICD-10-CM | POA: Diagnosis not present

## 2016-02-29 DIAGNOSIS — M62830 Muscle spasm of back: Secondary | ICD-10-CM | POA: Diagnosis not present

## 2016-02-29 DIAGNOSIS — M545 Low back pain: Secondary | ICD-10-CM | POA: Diagnosis not present

## 2016-02-29 DIAGNOSIS — M9905 Segmental and somatic dysfunction of pelvic region: Secondary | ICD-10-CM | POA: Diagnosis not present

## 2016-03-02 DIAGNOSIS — M9903 Segmental and somatic dysfunction of lumbar region: Secondary | ICD-10-CM | POA: Diagnosis not present

## 2016-03-02 DIAGNOSIS — M545 Low back pain: Secondary | ICD-10-CM | POA: Diagnosis not present

## 2016-03-02 DIAGNOSIS — M9905 Segmental and somatic dysfunction of pelvic region: Secondary | ICD-10-CM | POA: Diagnosis not present

## 2016-03-02 DIAGNOSIS — M62830 Muscle spasm of back: Secondary | ICD-10-CM | POA: Diagnosis not present

## 2016-03-02 DIAGNOSIS — M5441 Lumbago with sciatica, right side: Secondary | ICD-10-CM | POA: Diagnosis not present

## 2016-03-08 DIAGNOSIS — M5441 Lumbago with sciatica, right side: Secondary | ICD-10-CM | POA: Diagnosis not present

## 2016-03-08 DIAGNOSIS — M9905 Segmental and somatic dysfunction of pelvic region: Secondary | ICD-10-CM | POA: Diagnosis not present

## 2016-03-08 DIAGNOSIS — M62830 Muscle spasm of back: Secondary | ICD-10-CM | POA: Diagnosis not present

## 2016-03-08 DIAGNOSIS — M9903 Segmental and somatic dysfunction of lumbar region: Secondary | ICD-10-CM | POA: Diagnosis not present

## 2016-03-11 DIAGNOSIS — M9905 Segmental and somatic dysfunction of pelvic region: Secondary | ICD-10-CM | POA: Diagnosis not present

## 2016-03-11 DIAGNOSIS — M5441 Lumbago with sciatica, right side: Secondary | ICD-10-CM | POA: Diagnosis not present

## 2016-03-11 DIAGNOSIS — M9903 Segmental and somatic dysfunction of lumbar region: Secondary | ICD-10-CM | POA: Diagnosis not present

## 2016-03-11 DIAGNOSIS — M62830 Muscle spasm of back: Secondary | ICD-10-CM | POA: Diagnosis not present

## 2016-03-14 DIAGNOSIS — M62830 Muscle spasm of back: Secondary | ICD-10-CM | POA: Diagnosis not present

## 2016-03-14 DIAGNOSIS — M5441 Lumbago with sciatica, right side: Secondary | ICD-10-CM | POA: Diagnosis not present

## 2016-03-14 DIAGNOSIS — M9905 Segmental and somatic dysfunction of pelvic region: Secondary | ICD-10-CM | POA: Diagnosis not present

## 2016-03-14 DIAGNOSIS — M9903 Segmental and somatic dysfunction of lumbar region: Secondary | ICD-10-CM | POA: Diagnosis not present

## 2016-03-21 ENCOUNTER — Ambulatory Visit: Payer: Medicare Other | Admitting: Family Medicine

## 2016-03-22 DIAGNOSIS — M9903 Segmental and somatic dysfunction of lumbar region: Secondary | ICD-10-CM | POA: Diagnosis not present

## 2016-03-22 DIAGNOSIS — M62830 Muscle spasm of back: Secondary | ICD-10-CM | POA: Diagnosis not present

## 2016-03-22 DIAGNOSIS — M5441 Lumbago with sciatica, right side: Secondary | ICD-10-CM | POA: Diagnosis not present

## 2016-03-22 DIAGNOSIS — M9905 Segmental and somatic dysfunction of pelvic region: Secondary | ICD-10-CM | POA: Diagnosis not present

## 2016-03-30 DIAGNOSIS — M9905 Segmental and somatic dysfunction of pelvic region: Secondary | ICD-10-CM | POA: Diagnosis not present

## 2016-03-30 DIAGNOSIS — M62830 Muscle spasm of back: Secondary | ICD-10-CM | POA: Diagnosis not present

## 2016-03-30 DIAGNOSIS — M5441 Lumbago with sciatica, right side: Secondary | ICD-10-CM | POA: Diagnosis not present

## 2016-03-30 DIAGNOSIS — M9903 Segmental and somatic dysfunction of lumbar region: Secondary | ICD-10-CM | POA: Diagnosis not present

## 2016-04-04 ENCOUNTER — Other Ambulatory Visit: Payer: Self-pay | Admitting: Family Medicine

## 2016-04-06 DIAGNOSIS — M62830 Muscle spasm of back: Secondary | ICD-10-CM | POA: Diagnosis not present

## 2016-04-06 DIAGNOSIS — M9903 Segmental and somatic dysfunction of lumbar region: Secondary | ICD-10-CM | POA: Diagnosis not present

## 2016-04-06 DIAGNOSIS — M5441 Lumbago with sciatica, right side: Secondary | ICD-10-CM | POA: Diagnosis not present

## 2016-04-06 DIAGNOSIS — M9905 Segmental and somatic dysfunction of pelvic region: Secondary | ICD-10-CM | POA: Diagnosis not present

## 2016-04-14 DIAGNOSIS — M62830 Muscle spasm of back: Secondary | ICD-10-CM | POA: Diagnosis not present

## 2016-04-14 DIAGNOSIS — M5441 Lumbago with sciatica, right side: Secondary | ICD-10-CM | POA: Diagnosis not present

## 2016-04-14 DIAGNOSIS — M9905 Segmental and somatic dysfunction of pelvic region: Secondary | ICD-10-CM | POA: Diagnosis not present

## 2016-04-14 DIAGNOSIS — M9903 Segmental and somatic dysfunction of lumbar region: Secondary | ICD-10-CM | POA: Diagnosis not present

## 2016-04-21 DIAGNOSIS — M9903 Segmental and somatic dysfunction of lumbar region: Secondary | ICD-10-CM | POA: Diagnosis not present

## 2016-04-21 DIAGNOSIS — M9905 Segmental and somatic dysfunction of pelvic region: Secondary | ICD-10-CM | POA: Diagnosis not present

## 2016-04-21 DIAGNOSIS — M5441 Lumbago with sciatica, right side: Secondary | ICD-10-CM | POA: Diagnosis not present

## 2016-04-21 DIAGNOSIS — M62830 Muscle spasm of back: Secondary | ICD-10-CM | POA: Diagnosis not present

## 2016-05-05 DIAGNOSIS — M9905 Segmental and somatic dysfunction of pelvic region: Secondary | ICD-10-CM | POA: Diagnosis not present

## 2016-05-05 DIAGNOSIS — M62830 Muscle spasm of back: Secondary | ICD-10-CM | POA: Diagnosis not present

## 2016-05-05 DIAGNOSIS — M5441 Lumbago with sciatica, right side: Secondary | ICD-10-CM | POA: Diagnosis not present

## 2016-05-05 DIAGNOSIS — M9903 Segmental and somatic dysfunction of lumbar region: Secondary | ICD-10-CM | POA: Diagnosis not present

## 2016-05-12 DIAGNOSIS — M5441 Lumbago with sciatica, right side: Secondary | ICD-10-CM | POA: Diagnosis not present

## 2016-05-12 DIAGNOSIS — M62830 Muscle spasm of back: Secondary | ICD-10-CM | POA: Diagnosis not present

## 2016-05-12 DIAGNOSIS — M9903 Segmental and somatic dysfunction of lumbar region: Secondary | ICD-10-CM | POA: Diagnosis not present

## 2016-05-12 DIAGNOSIS — M9905 Segmental and somatic dysfunction of pelvic region: Secondary | ICD-10-CM | POA: Diagnosis not present

## 2016-05-17 DIAGNOSIS — M9903 Segmental and somatic dysfunction of lumbar region: Secondary | ICD-10-CM | POA: Diagnosis not present

## 2016-05-17 DIAGNOSIS — M9905 Segmental and somatic dysfunction of pelvic region: Secondary | ICD-10-CM | POA: Diagnosis not present

## 2016-05-17 DIAGNOSIS — M62830 Muscle spasm of back: Secondary | ICD-10-CM | POA: Diagnosis not present

## 2016-05-17 DIAGNOSIS — M5441 Lumbago with sciatica, right side: Secondary | ICD-10-CM | POA: Diagnosis not present

## 2016-05-18 ENCOUNTER — Encounter: Payer: Self-pay | Admitting: Physician Assistant

## 2016-05-25 DIAGNOSIS — Z01411 Encounter for gynecological examination (general) (routine) with abnormal findings: Secondary | ICD-10-CM | POA: Diagnosis not present

## 2016-05-25 DIAGNOSIS — M8580 Other specified disorders of bone density and structure, unspecified site: Secondary | ICD-10-CM | POA: Diagnosis not present

## 2016-05-25 DIAGNOSIS — N952 Postmenopausal atrophic vaginitis: Secondary | ICD-10-CM | POA: Diagnosis not present

## 2016-05-25 LAB — HM DEXA SCAN

## 2016-06-01 DIAGNOSIS — M62830 Muscle spasm of back: Secondary | ICD-10-CM | POA: Diagnosis not present

## 2016-06-01 DIAGNOSIS — M5441 Lumbago with sciatica, right side: Secondary | ICD-10-CM | POA: Diagnosis not present

## 2016-06-01 DIAGNOSIS — M9903 Segmental and somatic dysfunction of lumbar region: Secondary | ICD-10-CM | POA: Diagnosis not present

## 2016-06-01 DIAGNOSIS — M9905 Segmental and somatic dysfunction of pelvic region: Secondary | ICD-10-CM | POA: Diagnosis not present

## 2016-06-02 ENCOUNTER — Emergency Department: Admission: EM | Admit: 2016-06-02 | Discharge: 2016-06-02 | Discharge status: Absent without leave | Payer: Self-pay

## 2016-06-03 ENCOUNTER — Encounter: Payer: Self-pay | Admitting: Sports Medicine

## 2016-06-03 ENCOUNTER — Ambulatory Visit (INDEPENDENT_AMBULATORY_CARE_PROVIDER_SITE_OTHER): Payer: Medicare Other | Admitting: Sports Medicine

## 2016-06-03 VITALS — BP 189/79 | Temp 97.6°F | Wt 129.0 lb

## 2016-06-03 DIAGNOSIS — T148 Other injury of unspecified body region: Secondary | ICD-10-CM

## 2016-06-03 DIAGNOSIS — W57XXXA Bitten or stung by nonvenomous insect and other nonvenomous arthropods, initial encounter: Secondary | ICD-10-CM | POA: Insufficient documentation

## 2016-06-03 MED ORDER — DOXYCYCLINE HYCLATE 100 MG PO TABS: 100.0000 mg | ORAL_TABLET | Freq: Two times a day (BID) | ORAL | Status: AC

## 2016-06-03 MED ORDER — PREDNISONE 50 MG PO TABS: ORAL_TABLET | ORAL | Status: DC

## 2016-06-03 NOTE — Assessment & Plan Note (Signed)
Unclear as to what bit her, she thinks it's a tick, checking tickborne serologies, and adding doxycycline. She has 32 allergies which is likely bogus, adding prednisone with the doxycycline.

## 2016-06-03 NOTE — Progress Notes (Signed)
  Subjective:    CC: Insect bite  HPI: Several days ago this 60 year old female had an insect bite, she's not sure what it is but thinks it might of been a tick. Since then she's had an enlarging red spot, with a bit of tenderness and warmth. Symptoms are mild, worsening. She has over 30 allergies in her chart which is likely bogus.  Past medical history, Surgical history, Family history not pertinant except as noted below, Social history, Allergies, and medications have been entered into the medical record, reviewed, and no changes needed.   Review of Systems: No fevers, chills, night sweats, weight loss, chest pain, or shortness of breath.   Objective:    General: Well Developed, well nourished, and in no acute distress.  Neuro: Alert and oriented x3, extra-ocular muscles intact, sensation grossly intact.  HEENT: Normocephalic, atraumatic, pupils equal round reactive to light, neck supple, no masses, no lymphadenopathy, thyroid nonpalpable.  Skin: Warm and dry, 2-3 cm area of erythema with induration and warmth on the leg. Cardiac: Regular rate and rhythm, no murmurs rubs or gallops, no lower extremity edema.  Respiratory: Clear to auscultation bilaterally. Not using accessory muscles, speaking in full sentences.  Impression and Recommendations:    I spent 25 minutes with this patient, greater than 50% was face-to-face time counseling regarding the above diagnoses

## 2016-06-06 ENCOUNTER — Encounter: Payer: Self-pay | Admitting: Sports Medicine

## 2016-06-09 ENCOUNTER — Other Ambulatory Visit: Payer: Self-pay | Admitting: Sports Medicine

## 2016-06-09 DIAGNOSIS — W57XXXA Bitten or stung by nonvenomous insect and other nonvenomous arthropods, initial encounter: Secondary | ICD-10-CM | POA: Diagnosis not present

## 2016-06-09 DIAGNOSIS — T148 Other injury of unspecified body region: Secondary | ICD-10-CM | POA: Diagnosis not present

## 2016-06-10 ENCOUNTER — Encounter: Payer: Self-pay | Admitting: Sports Medicine

## 2016-06-10 LAB — LYME AB/WESTERN BLOT REFLEX: B burgdorferi Ab IgG+IgM: <0.9 Index (ref <0.90)

## 2016-06-11 LAB — ROCKY MTN SPOTTED FVR ABS PNL(IGG+IGM)
RMSF IgG: NOT DETECTED
RMSF IgM: NOT DETECTED

## 2016-06-15 LAB — EHRLICHIA ANTIBODY PANEL
E chaffeensis (HGE) Ab, IgG: <1:64 {titer}
E chaffeensis (HGE) Ab, IgM: <1:20 {titer}

## 2016-06-16 DIAGNOSIS — M5441 Lumbago with sciatica, right side: Secondary | ICD-10-CM | POA: Diagnosis not present

## 2016-06-16 DIAGNOSIS — M62830 Muscle spasm of back: Secondary | ICD-10-CM | POA: Diagnosis not present

## 2016-06-16 DIAGNOSIS — M9903 Segmental and somatic dysfunction of lumbar region: Secondary | ICD-10-CM | POA: Diagnosis not present

## 2016-06-16 DIAGNOSIS — M9905 Segmental and somatic dysfunction of pelvic region: Secondary | ICD-10-CM | POA: Diagnosis not present

## 2016-06-20 DIAGNOSIS — Z1231 Encounter for screening mammogram for malignant neoplasm of breast: Secondary | ICD-10-CM | POA: Diagnosis not present

## 2016-06-29 DIAGNOSIS — K589 Irritable bowel syndrome without diarrhea: Secondary | ICD-10-CM | POA: Diagnosis not present

## 2016-06-29 DIAGNOSIS — K222 Esophageal obstruction: Secondary | ICD-10-CM | POA: Diagnosis not present

## 2016-06-29 DIAGNOSIS — R1031 Right lower quadrant pain: Secondary | ICD-10-CM | POA: Diagnosis not present

## 2016-06-29 DIAGNOSIS — Z86010 Personal history of colonic polyps: Secondary | ICD-10-CM | POA: Diagnosis not present

## 2016-07-04 ENCOUNTER — Other Ambulatory Visit: Payer: Self-pay | Admitting: Family Medicine

## 2016-07-07 ENCOUNTER — Telehealth: Payer: Self-pay | Admitting: Family Medicine

## 2016-07-07 NOTE — Telephone Encounter (Signed)
I called patient and she is scheduled on 07/19/16 at 3:15 for fu on Fibromyalgia - CF

## 2016-07-14 DIAGNOSIS — D123 Benign neoplasm of transverse colon: Secondary | ICD-10-CM | POA: Diagnosis not present

## 2016-07-14 DIAGNOSIS — K2970 Gastritis, unspecified, without bleeding: Secondary | ICD-10-CM | POA: Diagnosis not present

## 2016-07-14 DIAGNOSIS — R1031 Right lower quadrant pain: Secondary | ICD-10-CM | POA: Diagnosis not present

## 2016-07-14 DIAGNOSIS — R1310 Dysphagia, unspecified: Secondary | ICD-10-CM | POA: Diagnosis not present

## 2016-07-14 DIAGNOSIS — Z86010 Personal history of colonic polyps: Secondary | ICD-10-CM | POA: Diagnosis not present

## 2016-07-14 LAB — HM COLONOSCOPY

## 2016-07-19 ENCOUNTER — Ambulatory Visit: Payer: Medicare Other | Admitting: Family Medicine

## 2016-07-20 DIAGNOSIS — M9905 Segmental and somatic dysfunction of pelvic region: Secondary | ICD-10-CM | POA: Diagnosis not present

## 2016-07-20 DIAGNOSIS — M62830 Muscle spasm of back: Secondary | ICD-10-CM | POA: Diagnosis not present

## 2016-07-20 DIAGNOSIS — M5441 Lumbago with sciatica, right side: Secondary | ICD-10-CM | POA: Diagnosis not present

## 2016-07-20 DIAGNOSIS — M9903 Segmental and somatic dysfunction of lumbar region: Secondary | ICD-10-CM | POA: Diagnosis not present

## 2016-07-25 ENCOUNTER — Encounter: Payer: Self-pay | Admitting: Family Medicine

## 2016-07-25 ENCOUNTER — Ambulatory Visit (INDEPENDENT_AMBULATORY_CARE_PROVIDER_SITE_OTHER): Payer: Medicare Other | Admitting: Family Medicine

## 2016-07-25 VITALS — BP 144/66 | HR 76 | Wt 128.0 lb

## 2016-07-25 DIAGNOSIS — IMO0001 Reserved for inherently not codable concepts without codable children: Secondary | ICD-10-CM

## 2016-07-25 DIAGNOSIS — M797 Fibromyalgia: Secondary | ICD-10-CM | POA: Diagnosis not present

## 2016-07-25 DIAGNOSIS — R030 Elevated blood-pressure reading, without diagnosis of hypertension: Secondary | ICD-10-CM | POA: Diagnosis not present

## 2016-07-25 DIAGNOSIS — M810 Age-related osteoporosis without current pathological fracture: Secondary | ICD-10-CM | POA: Diagnosis not present
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
## 2016-07-26 ENCOUNTER — Encounter: Payer: Self-pay | Admitting: Family Medicine

## 2016-08-03 ENCOUNTER — Encounter: Payer: Self-pay | Admitting: Family Medicine

## 2016-08-04 ENCOUNTER — Encounter: Payer: Self-pay | Admitting: Osteopathic Medicine

## 2016-08-04 ENCOUNTER — Ambulatory Visit (INDEPENDENT_AMBULATORY_CARE_PROVIDER_SITE_OTHER): Payer: Medicare Other | Admitting: Osteopathic Medicine

## 2016-08-04 VITALS — BP 162/85 | HR 78 | Ht 66.0 in | Wt 129.0 lb

## 2016-08-04 DIAGNOSIS — M797 Fibromyalgia: Secondary | ICD-10-CM

## 2016-08-04 DIAGNOSIS — M810 Age-related osteoporosis without current pathological fracture: Secondary | ICD-10-CM | POA: Diagnosis not present

## 2016-08-04 DIAGNOSIS — M9902 Segmental and somatic dysfunction of thoracic region: Secondary | ICD-10-CM

## 2016-08-04 DIAGNOSIS — M9901 Segmental and somatic dysfunction of cervical region: Secondary | ICD-10-CM

## 2016-08-04 DIAGNOSIS — M9908 Segmental and somatic dysfunction of rib cage: Secondary | ICD-10-CM

## 2016-08-04 DIAGNOSIS — M4004 Postural kyphosis, thoracic region: Secondary | ICD-10-CM
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
## 2016-08-10 ENCOUNTER — Encounter: Payer: Self-pay | Admitting: Family Medicine

## 2016-08-10 ENCOUNTER — Other Ambulatory Visit: Payer: Self-pay | Admitting: Family Medicine

## 2016-08-11 DIAGNOSIS — M4722 Other spondylosis with radiculopathy, cervical region: Secondary | ICD-10-CM | POA: Diagnosis not present

## 2016-08-11 DIAGNOSIS — M9901 Segmental and somatic dysfunction of cervical region: Secondary | ICD-10-CM | POA: Diagnosis not present

## 2016-08-11 DIAGNOSIS — M5414 Radiculopathy, thoracic region: Secondary | ICD-10-CM | POA: Diagnosis not present

## 2016-08-11 DIAGNOSIS — M9902 Segmental and somatic dysfunction of thoracic region: Secondary | ICD-10-CM | POA: Diagnosis not present

## 2016-08-11 MED ORDER — TRAMADOL HCL 50 MG PO TABS: 50.0000 mg | ORAL_TABLET | Freq: Four times a day (QID) | ORAL | 0 refills | Status: DC | PRN

## 2016-08-16 DIAGNOSIS — M5414 Radiculopathy, thoracic region: Secondary | ICD-10-CM | POA: Diagnosis not present

## 2016-08-16 DIAGNOSIS — M9901 Segmental and somatic dysfunction of cervical region: Secondary | ICD-10-CM | POA: Diagnosis not present

## 2016-08-16 DIAGNOSIS — M9902 Segmental and somatic dysfunction of thoracic region: Secondary | ICD-10-CM | POA: Diagnosis not present

## 2016-08-16 DIAGNOSIS — M4722 Other spondylosis with radiculopathy, cervical region: Secondary | ICD-10-CM | POA: Diagnosis not present

## 2016-08-17 ENCOUNTER — Ambulatory Visit (INDEPENDENT_AMBULATORY_CARE_PROVIDER_SITE_OTHER): Payer: Medicare Other | Admitting: Osteopathic Medicine

## 2016-08-17 ENCOUNTER — Encounter: Payer: Self-pay | Admitting: Osteopathic Medicine

## 2016-08-17 ENCOUNTER — Ambulatory Visit: Payer: Medicare Other | Admitting: Osteopathic Medicine

## 2016-08-17 VITALS — BP 173/87 | HR 67 | Ht 66.0 in | Wt 129.0 lb

## 2016-08-17 DIAGNOSIS — M791 Myalgia: Secondary | ICD-10-CM

## 2016-08-17 DIAGNOSIS — M62830 Muscle spasm of back: Secondary | ICD-10-CM

## 2016-08-17 DIAGNOSIS — M9901 Segmental and somatic dysfunction of cervical region: Secondary | ICD-10-CM | POA: Diagnosis not present

## 2016-08-17 DIAGNOSIS — M9903 Segmental and somatic dysfunction of lumbar region: Secondary | ICD-10-CM | POA: Diagnosis not present

## 2016-08-17 DIAGNOSIS — M9906 Segmental and somatic dysfunction of lower extremity: Secondary | ICD-10-CM

## 2016-08-17 DIAGNOSIS — M9902 Segmental and somatic dysfunction of thoracic region: Secondary | ICD-10-CM

## 2016-08-17 DIAGNOSIS — M7918 Myalgia, other site: Secondary | ICD-10-CM

## 2016-08-17 DIAGNOSIS — M810 Age-related osteoporosis without current pathological fracture: Secondary | ICD-10-CM

## 2016-08-17 DIAGNOSIS — M9904 Segmental and somatic dysfunction of sacral region: Secondary | ICD-10-CM

## 2016-08-17 DIAGNOSIS — M797 Fibromyalgia: Secondary | ICD-10-CM

## 2016-08-17 DIAGNOSIS — M4004 Postural kyphosis, thoracic region: Secondary | ICD-10-CM

## 2016-08-17 NOTE — Progress Notes (Signed)
  Subjective:    I'm seeing this patient as a consultation for:  Dr Madilyn Fireman  CC: Chronic pain  HPI: Patient reports significant chronic pain/fibromyalgia. What is bothering her most today is upper back/neck, Mid back, left hip/buttock.  Previously seen by me 08/04/16 for osteopathic  manipulation treatments upper back, ribs. Treatment at last visit were helpful.  Hx white coat hypertension.   Past medical history, Surgical history, Family history not pertinant except as noted below, Social history, Allergies, and medications have been entered into the medical record, reviewed, and no changes needed.   Review of Systems: no headache, visual changes, nausea, vomiting, diarrhea, constipation, dizziness, abdominal pain, skin rash, fevers, chills, night sweats, weight loss, swollen lymph nodes, body aches, joint swelling, muscle aches, chest pain, shortness of breath, mood changes, visual or auditory hallucinations.   Objective:    Vitals:   08/17/16 1559  BP: (!) 173/87  Pulse: 67   General: Well Developed, well nourished, and in no acute distress.  Neuro/Psych: Alert and oriented x3, extra-ocular muscles intact, able to move all 4 extremities, sensation grossly intact. Skin: Warm and dry, no rashes noted.  Respiratory: Not using accessory muscles, speaking in full sentences, trachea midline.  Cardiovascular: Pulses palpable, no extremity edema. Abdomen: Does not appear distended. MSK: Significant thoracic kyphosis and cervical lordosis. Paraspinal muscle spasm and tender points on mostly the left cervical and thoracic spine fairly diffusely. Significant tender point left buttocks/performance area and right SI joint    Impression and Recommendations:   This case required medical decision making of moderate complexity.  Somatic dysfunction of spine, cervical - Myofascial release/FPR  Somatic dysfunction of spine, thoracic - Myofascial release/SPR/rib raising  Somatic dysfunction  of spine, lumbar - HVLA attempted to little effect, myofascial release  Somatic dysfunction of sacroiliac joint - Sacral rocking, HVLA  Somatic dysfunction of lower extremity - If PR-2 tender point at piriformis on the L  Postural kyphosis of thoracic region  Fibromyalgia  Osteoporosis  Muscle spasm of back - Plan: Ambulatory referral to Physical Therapy  Piriformis muscle pain - Plan: Ambulatory referral to Physical Therapy   Patient to return as needed for manipulation treatments. Also placed referral for physical therapy for consultation on whether patient would be good candidate for dry needling or other treatment for chronic muscle spasm. Again, reiterated that patient's posture is a problem and is going to contribute to muscle strain over the long-term, particularly with diagnosis of osteoporosis   CC chart to Dr Madilyn Fireman PCP  Total time spent 30 minutes, greater than 50% of the visit was counseling and coordinating care for diagnosis of somatic dysfunction as noted above

## 2016-08-18 DIAGNOSIS — M9901 Segmental and somatic dysfunction of cervical region: Secondary | ICD-10-CM | POA: Diagnosis not present

## 2016-08-18 DIAGNOSIS — M4722 Other spondylosis with radiculopathy, cervical region: Secondary | ICD-10-CM | POA: Diagnosis not present

## 2016-08-18 DIAGNOSIS — M5414 Radiculopathy, thoracic region: Secondary | ICD-10-CM | POA: Diagnosis not present

## 2016-08-18 DIAGNOSIS — M9902 Segmental and somatic dysfunction of thoracic region: Secondary | ICD-10-CM | POA: Diagnosis not present

## 2016-08-22 DIAGNOSIS — M5414 Radiculopathy, thoracic region: Secondary | ICD-10-CM | POA: Diagnosis not present

## 2016-08-22 DIAGNOSIS — M9901 Segmental and somatic dysfunction of cervical region: Secondary | ICD-10-CM | POA: Diagnosis not present

## 2016-08-22 DIAGNOSIS — M9902 Segmental and somatic dysfunction of thoracic region: Secondary | ICD-10-CM | POA: Diagnosis not present

## 2016-08-22 DIAGNOSIS — M4722 Other spondylosis with radiculopathy, cervical region: Secondary | ICD-10-CM | POA: Diagnosis not present

## 2016-08-24 ENCOUNTER — Ambulatory Visit: Payer: Medicare Other | Admitting: Osteopathic Medicine

## 2016-08-25 DIAGNOSIS — M9902 Segmental and somatic dysfunction of thoracic region: Secondary | ICD-10-CM | POA: Diagnosis not present

## 2016-08-25 DIAGNOSIS — M5414 Radiculopathy, thoracic region: Secondary | ICD-10-CM | POA: Diagnosis not present

## 2016-08-25 DIAGNOSIS — M9901 Segmental and somatic dysfunction of cervical region: Secondary | ICD-10-CM | POA: Diagnosis not present

## 2016-08-25 DIAGNOSIS — M4722 Other spondylosis with radiculopathy, cervical region: Secondary | ICD-10-CM | POA: Diagnosis not present

## 2016-08-30 ENCOUNTER — Telehealth: Payer: Self-pay | Admitting: Family Medicine

## 2016-08-30 ENCOUNTER — Ambulatory Visit (INDEPENDENT_AMBULATORY_CARE_PROVIDER_SITE_OTHER): Payer: Medicare Other | Admitting: Family Medicine

## 2016-08-30 ENCOUNTER — Encounter: Payer: Self-pay | Admitting: Family Medicine

## 2016-08-30 VITALS — BP 163/83 | HR 102 | Wt 128.0 lb

## 2016-08-30 DIAGNOSIS — R21 Rash and other nonspecific skin eruption: Secondary | ICD-10-CM

## 2016-08-30 DIAGNOSIS — Z0000 Encounter for general adult medical examination without abnormal findings: Secondary | ICD-10-CM

## 2016-08-30 DIAGNOSIS — M4722 Other spondylosis with radiculopathy, cervical region: Secondary | ICD-10-CM | POA: Diagnosis not present

## 2016-08-30 DIAGNOSIS — M9901 Segmental and somatic dysfunction of cervical region: Secondary | ICD-10-CM | POA: Diagnosis not present

## 2016-08-30 DIAGNOSIS — M5414 Radiculopathy, thoracic region: Secondary | ICD-10-CM | POA: Diagnosis not present

## 2016-08-30 DIAGNOSIS — M810 Age-related osteoporosis without current pathological fracture: Secondary | ICD-10-CM

## 2016-08-30 DIAGNOSIS — M9902 Segmental and somatic dysfunction of thoracic region: Secondary | ICD-10-CM | POA: Diagnosis not present
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
## 2016-08-30 NOTE — Patient Instructions (Signed)
Keep up a regular exercise program and make sure you are eating a healthy diet Try to eat 4 servings of dairy a day, or if you are lactose intolerant take a calcium with vitamin D daily.  Your vaccines are up to date.   

## 2016-08-30 NOTE — Telephone Encounter (Signed)
Call Dr. Vella Raring, GYN, for recent mammo and bone density report.

## 2016-08-31 NOTE — Telephone Encounter (Signed)
Reports are being sent to clinic.

## 2016-09-01 DIAGNOSIS — M9902 Segmental and somatic dysfunction of thoracic region: Secondary | ICD-10-CM | POA: Diagnosis not present

## 2016-09-01 DIAGNOSIS — M4722 Other spondylosis with radiculopathy, cervical region: Secondary | ICD-10-CM | POA: Diagnosis not present

## 2016-09-01 DIAGNOSIS — M9901 Segmental and somatic dysfunction of cervical region: Secondary | ICD-10-CM | POA: Diagnosis not present

## 2016-09-01 DIAGNOSIS — M5414 Radiculopathy, thoracic region: Secondary | ICD-10-CM | POA: Diagnosis not present

## 2016-09-02 LAB — FUNGAL STAIN

## 2016-09-02 NOTE — Telephone Encounter (Signed)
Call patient: We were able to get a copy of her last mammogram and bone density test. Her last bone density test shows a T score of -2.6 which does indicate osteoporosis. Based on that score I would encourage her to consider taking a bone builder in addition to regular exercise and healthy diet and adequate intake of calcium with vitamin D daily. I'm not sure if she is Artie taken something like this in the past or not. I do not see anything specifically on her allergy list.

## 2016-09-06 ENCOUNTER — Encounter: Payer: Self-pay | Admitting: Family Medicine

## 2016-09-06 ENCOUNTER — Ambulatory Visit: Payer: Medicare Other | Admitting: Family Medicine

## 2016-09-06 DIAGNOSIS — M9902 Segmental and somatic dysfunction of thoracic region: Secondary | ICD-10-CM | POA: Diagnosis not present

## 2016-09-06 DIAGNOSIS — M5414 Radiculopathy, thoracic region: Secondary | ICD-10-CM | POA: Diagnosis not present

## 2016-09-06 DIAGNOSIS — M9901 Segmental and somatic dysfunction of cervical region: Secondary | ICD-10-CM | POA: Diagnosis not present

## 2016-09-06 DIAGNOSIS — M4722 Other spondylosis with radiculopathy, cervical region: Secondary | ICD-10-CM | POA: Diagnosis not present

## 2016-09-08 ENCOUNTER — Encounter: Payer: Self-pay | Admitting: Physical Therapy

## 2016-09-08 ENCOUNTER — Ambulatory Visit (INDEPENDENT_AMBULATORY_CARE_PROVIDER_SITE_OTHER): Payer: Medicare Other | Admitting: Physical Therapy

## 2016-09-08 DIAGNOSIS — M5414 Radiculopathy, thoracic region: Secondary | ICD-10-CM | POA: Diagnosis not present

## 2016-09-08 DIAGNOSIS — R52 Pain, unspecified: Secondary | ICD-10-CM

## 2016-09-08 DIAGNOSIS — R293 Abnormal posture: Secondary | ICD-10-CM

## 2016-09-08 DIAGNOSIS — M9901 Segmental and somatic dysfunction of cervical region: Secondary | ICD-10-CM | POA: Diagnosis not present

## 2016-09-08 DIAGNOSIS — M9902 Segmental and somatic dysfunction of thoracic region: Secondary | ICD-10-CM | POA: Diagnosis not present

## 2016-09-08 DIAGNOSIS — M4722 Other spondylosis with radiculopathy, cervical region: Secondary | ICD-10-CM | POA: Diagnosis not present

## 2016-09-08 NOTE — Patient Instructions (Addendum)
TENS UNIT: This is helpful for muscle pain and spasm.   Search and Purchase a TENS 7000 2nd edition at www.tenspros.com. It should be less than $30.     TENS unit instructions: Do not shower or bathe with the unit on Turn the unit off before removing electrodes or batteries If the electrodes lose stickiness add a drop of water to the electrodes after they are disconnected from the unit and place on plastic sheet. If you continued to have difficulty, call the TENS unit company to purchase more electrodes. Do not apply lotion on the skin area prior to use. Make sure the skin is clean and dry as this will help prolong the life of the electrodes. After use, always check skin for unusual red areas, rash or other skin difficulties. If there are any skin problems, does not apply electrodes to the same area. Never remove the electrodes from the unit by pulling the wires. Do not use the TENS unit or electrodes other than as directed. Do not change electrode placement without consultating your therapist or physician. Keep 2 fingers with between each electrode. Wear time ratio is 2:1, on to off times.    For example on for 30 minutes off for 15 minutes and then on for 30 minutes off for 15 minutes    And MEEKs posture realignment  Exercises  Trigger Point Dry Needling  . What is Trigger Point Dry Needling (DN)? o DN is a physical therapy technique used to treat muscle pain and dysfunction. Specifically, DN helps deactivate muscle trigger points (muscle knots).  o A thin filiform needle is used to penetrate the skin and stimulate the underlying trigger point. The goal is for a local twitch response (LTR) to occur and for the trigger point to relax. No medication of any kind is injected during the procedure.   . What Does Trigger Point Dry Needling Feel Like?  o The procedure feels different for each individual patient. Some patients report that they do not actually feel the needle enter the skin  and overall the process is not painful. Very mild bleeding may occur. However, many patients feel a deep cramping in the muscle in which the needle was inserted. This is the local twitch response.   Marland Kitchen How Will I feel after the treatment? o Soreness is normal, and the onset of soreness may not occur for a few hours. Typically this soreness does not last longer than two days.  o Bruising is uncommon, however; ice can be used to decrease any possible bruising.  o In rare cases feeling tired or nauseous after the treatment is normal. In addition, your symptoms may get worse before they get better, this period will typically not last longer than 24 hours.   . What Can I do After My Treatment? o Increase your hydration by drinking more water for the next 24 hours. o You may place ice or heat on the areas treated that have become sore, however, do not use heat on inflamed or bruised areas. Heat often brings more relief post needling. o You can continue your regular activities, but vigorous activity is not recommended initially after the treatment for 24 hours. o DN is best combined with other physical therapy such as strengthening, stretching, and other therapies.
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
## 2016-09-09 ENCOUNTER — Encounter: Payer: Self-pay | Admitting: Family Medicine

## 2016-09-09 ENCOUNTER — Telehealth: Payer: Self-pay | Admitting: Cardiology

## 2016-09-09 MED ORDER — TRAMADOL HCL 50 MG PO TABS: 50.0000 mg | ORAL_TABLET | Freq: Four times a day (QID) | ORAL | 0 refills | Status: DC | PRN

## 2016-09-09 NOTE — Telephone Encounter (Signed)
In working the recall list, I reached out to Breanna Casey because she is past due for her 3 month follow-up per check-out in January 2017. The patient chose not to schedule any additional follow-up appointments with [place] at this time. Patient said she is no longer taking any prescriptions that were provided by our physicians and is instead controlling her health with OTC supplements. She said she she doesn't feel she needs to see a cardiologist and will call us if something changes.  Routing to Dr. Martinique as an Juluis Rainier.

## 2016-09-12 ENCOUNTER — Encounter: Payer: Self-pay | Admitting: Osteopathic Medicine

## 2016-09-12 DIAGNOSIS — M25519 Pain in unspecified shoulder: Secondary | ICD-10-CM

## 2016-09-12 DIAGNOSIS — M542 Cervicalgia: Principal | ICD-10-CM

## 2016-09-13 DIAGNOSIS — M5414 Radiculopathy, thoracic region: Secondary | ICD-10-CM | POA: Diagnosis not present

## 2016-09-13 DIAGNOSIS — M4722 Other spondylosis with radiculopathy, cervical region: Secondary | ICD-10-CM | POA: Diagnosis not present

## 2016-09-13 DIAGNOSIS — M9901 Segmental and somatic dysfunction of cervical region: Secondary | ICD-10-CM | POA: Diagnosis not present

## 2016-09-13 DIAGNOSIS — M9902 Segmental and somatic dysfunction of thoracic region: Secondary | ICD-10-CM | POA: Diagnosis not present

## 2016-09-13 NOTE — Telephone Encounter (Signed)
Left detailed vm informing pt of recommendations.Breanna Casey Breanna Casey  

## 2016-09-16 ENCOUNTER — Encounter: Payer: Self-pay | Admitting: Family Medicine

## 2016-09-16 ENCOUNTER — Ambulatory Visit (INDEPENDENT_AMBULATORY_CARE_PROVIDER_SITE_OTHER): Payer: Medicare Other | Admitting: Family Medicine

## 2016-09-16 VITALS — BP 132/65 | HR 76 | Wt 128.0 lb

## 2016-09-16 DIAGNOSIS — M9902 Segmental and somatic dysfunction of thoracic region: Secondary | ICD-10-CM | POA: Diagnosis not present

## 2016-09-16 DIAGNOSIS — M5414 Radiculopathy, thoracic region: Secondary | ICD-10-CM | POA: Diagnosis not present

## 2016-09-16 DIAGNOSIS — L989 Disorder of the skin and subcutaneous tissue, unspecified: Secondary | ICD-10-CM | POA: Diagnosis not present

## 2016-09-16 DIAGNOSIS — D2271 Melanocytic nevi of right lower limb, including hip: Secondary | ICD-10-CM | POA: Diagnosis not present

## 2016-09-16 DIAGNOSIS — M4722 Other spondylosis with radiculopathy, cervical region: Secondary | ICD-10-CM | POA: Diagnosis not present

## 2016-09-16 DIAGNOSIS — M9901 Segmental and somatic dysfunction of cervical region: Secondary | ICD-10-CM | POA: Diagnosis not present

## 2016-09-16 NOTE — Progress Notes (Signed)
Shave Biopsy Procedure Note  Pre-operative Diagnosis: Suspicious lesion  Post-operative Diagnosis: same  Locations:right lateral lower leg  Indications: change in shape over the last week or two.   Anesthesia: Lidocaine 1% with epinephrine with added sodium bicarbonate  Procedure Details  Patient informed of the risks (including bleeding and infection) and benefits of the  procedure and Verbal informed consent obtained.  The lesion and surrounding area were given a sterile prep using chlorhexidine and draped in the usual sterile fashion. A scalpel was used to shave an area of skin approximately 0.8cm by 0.8cm.  Hemostasis achieved with alumuninum chloride. Antibiotic ointment and a sterile dressing applied.  The specimen was sent for pathologic examination. The patient tolerated the procedure well.  EBL: 0 ml  Findings: Await pathology  Condition: Stable  Complications: none.  Plan: 1. Instructed to keep the wound dry and covered for 24-48h and clean thereafter. 2. Warning signs of infection were reviewed.   3. Recommended that the patient use OTC acetaminophen as needed for pain.  4. Return PRN.  F/U wound care discussed.

## 2016-09-16 NOTE — Patient Instructions (Signed)
Keep covered for 24 hours.   Can use vaseline on the wound twice and day.

## 2016-09-20 DIAGNOSIS — M9902 Segmental and somatic dysfunction of thoracic region: Secondary | ICD-10-CM | POA: Diagnosis not present

## 2016-09-20 DIAGNOSIS — M4722 Other spondylosis with radiculopathy, cervical region: Secondary | ICD-10-CM | POA: Diagnosis not present

## 2016-09-20 DIAGNOSIS — M5414 Radiculopathy, thoracic region: Secondary | ICD-10-CM | POA: Diagnosis not present

## 2016-09-20 DIAGNOSIS — M9901 Segmental and somatic dysfunction of cervical region: Secondary | ICD-10-CM | POA: Diagnosis not present

## 2016-09-21 ENCOUNTER — Encounter: Payer: Self-pay | Admitting: Family Medicine

## 2016-09-21 NOTE — Addendum Note (Signed)
Addended by: Teddy Spike on: 09/21/2016 10:07 AM   Modules accepted: Orders

## 2016-09-22 ENCOUNTER — Ambulatory Visit (INDEPENDENT_AMBULATORY_CARE_PROVIDER_SITE_OTHER): Payer: Medicare Other | Admitting: Physical Therapy

## 2016-09-22 ENCOUNTER — Other Ambulatory Visit: Payer: Self-pay | Admitting: Family Medicine

## 2016-09-22 DIAGNOSIS — R52 Pain, unspecified: Secondary | ICD-10-CM | POA: Diagnosis present

## 2016-09-22 DIAGNOSIS — M4722 Other spondylosis with radiculopathy, cervical region: Secondary | ICD-10-CM | POA: Diagnosis not present

## 2016-09-22 DIAGNOSIS — M9901 Segmental and somatic dysfunction of cervical region: Secondary | ICD-10-CM | POA: Diagnosis not present

## 2016-09-22 DIAGNOSIS — M5414 Radiculopathy, thoracic region: Secondary | ICD-10-CM | POA: Diagnosis not present

## 2016-09-22 DIAGNOSIS — R293 Abnormal posture: Secondary | ICD-10-CM

## 2016-09-22 DIAGNOSIS — M9902 Segmental and somatic dysfunction of thoracic region: Secondary | ICD-10-CM | POA: Diagnosis not present
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
## 2016-09-23 ENCOUNTER — Encounter: Payer: Self-pay | Admitting: Family Medicine

## 2016-09-26 ENCOUNTER — Ambulatory Visit (INDEPENDENT_AMBULATORY_CARE_PROVIDER_SITE_OTHER): Payer: Medicare Other | Admitting: Physical Therapy

## 2016-09-26 DIAGNOSIS — R52 Pain, unspecified: Secondary | ICD-10-CM

## 2016-09-26 DIAGNOSIS — D2261 Melanocytic nevi of right upper limb, including shoulder: Secondary | ICD-10-CM | POA: Diagnosis not present

## 2016-09-26 DIAGNOSIS — R293 Abnormal posture: Secondary | ICD-10-CM | POA: Diagnosis not present
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
## 2016-09-27 ENCOUNTER — Encounter: Payer: Self-pay | Admitting: Family Medicine

## 2016-09-27 ENCOUNTER — Ambulatory Visit (INDEPENDENT_AMBULATORY_CARE_PROVIDER_SITE_OTHER): Payer: Medicare Other | Admitting: Family Medicine

## 2016-09-27 VITALS — BP 152/88 | HR 74 | Temp 98.1°F | Wt 129.0 lb

## 2016-09-27 DIAGNOSIS — R209 Unspecified disturbances of skin sensation: Secondary | ICD-10-CM

## 2016-09-27 DIAGNOSIS — M9902 Segmental and somatic dysfunction of thoracic region: Secondary | ICD-10-CM | POA: Diagnosis not present

## 2016-09-27 DIAGNOSIS — L299 Pruritus, unspecified: Secondary | ICD-10-CM

## 2016-09-27 DIAGNOSIS — M9901 Segmental and somatic dysfunction of cervical region: Secondary | ICD-10-CM | POA: Diagnosis not present

## 2016-09-27 DIAGNOSIS — M4722 Other spondylosis with radiculopathy, cervical region: Secondary | ICD-10-CM | POA: Diagnosis not present

## 2016-09-27 DIAGNOSIS — G250 Essential tremor: Secondary | ICD-10-CM | POA: Diagnosis not present

## 2016-09-27 DIAGNOSIS — M5414 Radiculopathy, thoracic region: Secondary | ICD-10-CM | POA: Diagnosis not present

## 2016-09-27 DIAGNOSIS — R197 Diarrhea, unspecified: Secondary | ICD-10-CM

## 2016-09-28 DIAGNOSIS — G250 Essential tremor: Secondary | ICD-10-CM | POA: Insufficient documentation

## 2016-09-28 NOTE — Progress Notes (Signed)
Subjective:    CC:   HPI:  60 year old female comes in today with multiple medical concerns that she would like to discuss today. We had satisfied this appointment specifically to address these. First of all she is concerned because after we did the biopsy she says within hours the when started to look infected does look red and she started to get some swelling. We had try to call her back on Friday to get her in for an appointment but had to leave a message. Patient says that she never received a phone call or message. Thus I'm not clear what happened. She did end up seeing the dermatologist on Monday. Her biopsy originally showed possible early melanoma. He wants to review the slides before doing any further treatment and did go ahead and put her on an antibiotic to help clear the acute infection.  She wanted to discuss how to get a more flat abdomen and lose weight. She says she's asked several physicians in the past and has not come up with a good answer yet.  She's also concerned about her big toes turning blue on her feet. It's a little worse on the left foot compared to the right foot. She says that will actually get cold to touch. She feels like she may have Raynaud's but has never been formally diagnosed. Rate about blood flow since she is over the age of 18. No prior history of vascular disease. She is not a smoker. Last lipid as below. She's never had a history of a DVT or a blood clot. Lab Results  Component Value Date   CHOL 218 (H) 10/28/2015   HDL 91 10/28/2015   LDLCALC 113 10/28/2015   TRIG 72 10/28/2015   CHOLHDL 2.4 10/28/2015    She's also concerned because she has been told that she has thick, sticky blood. She has difficulty giving blood and has only found one person he's been able to do it for her. Multiple people over the years have commented on the fact that her blood is very thick. They will start to get a little bit of blood in the tube and then it just stops. She  wonders if there is anything she could do ahead of time to help. She says she really hydrates well before getting blood drawn. She cannot take aspirin. She does take fish oil. She's never had a blood clot.  She also complains of a tremor. She says both her brother and her sister have a benign ESSential tremor. There is is much more severe than hers but she did want to discuss it today. It mostly affects her hands and sometimes her hand writing. It's been going on for years but has gotten a little worse.   She also had an episode a few weeks ago where she had 2 days of the bloody stool. She said she had a similar episode years ago but it lasted longer. That episode resolved pretty quickly this time around. She doesn't number eating anything unusual. She's had 4 colonoscopies in her lifetime. The last one was actually just in August. She's never been diagnosed with Crohn's disease or ulcerative colitis.  She also complains of itching just behind her left collarbone and occasionally in her right wrist. She denies any known triggers. It can last for several hours at a time.  Past medical history, Surgical history, Family history not pertinant except as noted below, Social history, Allergies, and medications have been entered into the medical record, reviewed, and  corrections made.   Review of Systems: No fevers, chills, night sweats, weight loss, chest pain, or shortness of breath.   Objective:    General: Well Developed, well nourished, and in no acute distress.  Neuro: Alert and oriented x3, extra-ocular muscles intact, sensation grossly intact.  HEENT: Normocephalic, atraumatic  Skin: Warm and dry, no rashes. Cardiac: Regular rate and rhythm, no murmurs rubs or gallops, no lower extremity edema.  Respiratory: Clear to auscultation bilaterally. Not using accessory muscles, speaking in full sentences. Ext: Just palpable dorsal pedal pulses bilaterally. I would call them trace. No edema or swelling  of the ankles or feet. Her great toes do look slightly bluish in color. And very cold to touch. No rash.   Impression and Recommendations:    Abdominal fat-discussed that there are studies showing tachycardia workout is most effective for weight loss in the abdomen. Abdominal fat does increase risk of heart disease and stroke. I also described to her a tightening and breathing techniques that she can also do to help home the abdomen which is more effective than doing crunches etc. Next  Blue toes-her exam is consistent with discoloration and coolness of all of her toes in general but definitely the big toes are more impressive. We discussed getting an evaluation with ABIs for blood flow to look for peripheral vascular disease. Certainly could also be Raynaud's and we discussed that there are potential treatments for that.   Tremor-most consistent with benign ESSential-discussed that there are medications to help control the symptoms but does not reverse the disease and it does not slow progression. She is not at a point where she wants to take a daily medication to control the tremor.   Thick blood-unclear. Without any prior history of DVT or blood clot it is unlikely that she is hypercoagulable. She may just have small veins which makes her more difficult stick. Certainly she could consider taking an aspirin but she is allergic. I did explain the official dust in the blood Little bit. Just making sure again that she is drinking plenty of water before a blood draw.   Bloody diarrhea-unclear etiology. She had a short-lived 2 day episode that seems to have resolved on its own. Explained it certainly could've been a short-term bacterial infection from contaminated food. Unlikely be Crohn's disease or also to colitis being that she just recently had a colonoscopy about 2 months ago and has had 4 different scopes over her lifetime. Just try to give her reassurance and certainly if the symptoms recur then let  me know and we'll try to get her back in with GI for further evaluation.  Itching, and 2 particular locations. Unclear etiology. Certainly could be a contact her otitis or allergic reaction. She never sees a rash. I'm a little bit baffled by this. She could certainly try to keep track to see if it could be triggered by certain foods.  Time spent 45 minutes greater than 50% time spent counseling about the tremor, blue toes, bloody diarrhea, itching, and abdominal fat.

## 2016-09-29 ENCOUNTER — Ambulatory Visit (INDEPENDENT_AMBULATORY_CARE_PROVIDER_SITE_OTHER): Payer: Medicare Other | Admitting: Physical Therapy

## 2016-09-29 ENCOUNTER — Encounter: Payer: Self-pay | Admitting: Physical Therapy

## 2016-09-29 DIAGNOSIS — R293 Abnormal posture: Secondary | ICD-10-CM | POA: Diagnosis present

## 2016-09-29 DIAGNOSIS — M9902 Segmental and somatic dysfunction of thoracic region: Secondary | ICD-10-CM | POA: Diagnosis not present

## 2016-09-29 DIAGNOSIS — M4722 Other spondylosis with radiculopathy, cervical region: Secondary | ICD-10-CM | POA: Diagnosis not present

## 2016-09-29 DIAGNOSIS — M9901 Segmental and somatic dysfunction of cervical region: Secondary | ICD-10-CM | POA: Diagnosis not present

## 2016-09-29 DIAGNOSIS — M5414 Radiculopathy, thoracic region: Secondary | ICD-10-CM | POA: Diagnosis not present

## 2016-09-29 DIAGNOSIS — R52 Pain, unspecified: Secondary | ICD-10-CM | POA: Diagnosis not present

## 2016-09-29 NOTE — Patient Instructions (Signed)
Over Head Pull: Narrow Grip     [place] [phone]     Perform once a day.    On back, knees bent, feet flat, band across thighs, elbows straight but relaxed. Pull hands apart (start). Keeping elbows straight, bring arms up and over head, hands toward floor. Keep pull steady on band. Hold momentarily. Return slowly, keeping pull steady, back to start. Repeat __10-20_ times. Band color __yellow____   Side Pull: Double Arm   On back, knees bent, feet flat. Arms perpendicular to body, shoulder level, elbows straight but relaxed. Pull arms out to sides, elbows straight. Resistance band comes across collarbones, hands toward floor. Hold momentarily. Slowly return to starting position. Repeat _10-20__ times. Band color __yellow___   Sash   On back, knees bent, feet flat, left hand on left hip, right hand above left. Pull right arm DIAGONALLY (hip to shoulder) across chest. Bring right arm along head toward floor. Hold momentarily. Slowly return to starting position. Repeat _10-20__ times. Do with left arm. Band color __yellow____   Shoulder Rotation: Double Arm   On back, knees bent, feet flat, elbows tucked at sides, bent 90, hands palms up. Pull hands apart and down toward floor, keeping elbows near sides. Hold momentarily. Slowly return to starting position. Repeat _10-20__ times. Band color __yellow ____
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
## 2016-10-03 ENCOUNTER — Encounter: Payer: Self-pay | Admitting: Physical Therapy

## 2016-10-03 ENCOUNTER — Ambulatory Visit (INDEPENDENT_AMBULATORY_CARE_PROVIDER_SITE_OTHER): Payer: Medicare Other | Admitting: Physical Therapy

## 2016-10-03 DIAGNOSIS — R293 Abnormal posture: Secondary | ICD-10-CM | POA: Diagnosis present

## 2016-10-03 DIAGNOSIS — R52 Pain, unspecified: Secondary | ICD-10-CM

## 2016-10-03 DIAGNOSIS — D0371 Melanoma in situ of right lower limb, including hip: Secondary | ICD-10-CM

## 2016-10-03 HISTORY — DX: Melanoma in situ of right lower limb, including hip: D03.71
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
## 2016-10-05 ENCOUNTER — Ambulatory Visit (INDEPENDENT_AMBULATORY_CARE_PROVIDER_SITE_OTHER): Payer: Medicare Other | Admitting: Physical Therapy

## 2016-10-05 DIAGNOSIS — R52 Pain, unspecified: Secondary | ICD-10-CM

## 2016-10-05 DIAGNOSIS — M4722 Other spondylosis with radiculopathy, cervical region: Secondary | ICD-10-CM | POA: Diagnosis not present

## 2016-10-05 DIAGNOSIS — M9901 Segmental and somatic dysfunction of cervical region: Secondary | ICD-10-CM | POA: Diagnosis not present

## 2016-10-05 DIAGNOSIS — M5414 Radiculopathy, thoracic region: Secondary | ICD-10-CM | POA: Diagnosis not present

## 2016-10-05 DIAGNOSIS — M9902 Segmental and somatic dysfunction of thoracic region: Secondary | ICD-10-CM | POA: Diagnosis not present

## 2016-10-05 DIAGNOSIS — R293 Abnormal posture: Secondary | ICD-10-CM
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
## 2016-10-09 ENCOUNTER — Other Ambulatory Visit: Payer: Self-pay | Admitting: Family Medicine

## 2016-10-11 DIAGNOSIS — D0372 Melanoma in situ of left lower limb, including hip: Secondary | ICD-10-CM | POA: Insufficient documentation

## 2016-10-11 DIAGNOSIS — C4371 Malignant melanoma of right lower limb, including hip: Secondary | ICD-10-CM | POA: Insufficient documentation

## 2016-10-11 DIAGNOSIS — R238 Other skin changes: Secondary | ICD-10-CM | POA: Diagnosis not present

## 2016-10-11 DIAGNOSIS — D0371 Melanoma in situ of right lower limb, including hip: Secondary | ICD-10-CM | POA: Diagnosis not present

## 2016-10-12 ENCOUNTER — Encounter: Payer: Self-pay | Admitting: Physical Therapy

## 2016-10-12 ENCOUNTER — Other Ambulatory Visit: Payer: Self-pay | Admitting: Family Medicine

## 2016-10-12 DIAGNOSIS — D0371 Melanoma in situ of right lower limb, including hip: Secondary | ICD-10-CM | POA: Diagnosis not present

## 2016-10-12 DIAGNOSIS — L905 Scar conditions and fibrosis of skin: Secondary | ICD-10-CM | POA: Diagnosis not present

## 2016-10-12 MED ORDER — TRAMADOL HCL 50 MG PO TABS: 50.0000 mg | ORAL_TABLET | Freq: Four times a day (QID) | ORAL | 0 refills | Status: DC | PRN

## 2016-10-13 ENCOUNTER — Encounter: Payer: Medicare Other | Admitting: Physical Therapy

## 2016-10-17 ENCOUNTER — Ambulatory Visit (INDEPENDENT_AMBULATORY_CARE_PROVIDER_SITE_OTHER): Payer: Medicare Other | Admitting: Physical Therapy

## 2016-10-17 ENCOUNTER — Encounter: Payer: Self-pay | Admitting: Physical Therapy

## 2016-10-17 DIAGNOSIS — R293 Abnormal posture: Secondary | ICD-10-CM

## 2016-10-17 DIAGNOSIS — R52 Pain, unspecified: Secondary | ICD-10-CM
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
## 2016-10-17 NOTE — Patient Instructions (Signed)

## 2016-10-20 ENCOUNTER — Ambulatory Visit (INDEPENDENT_AMBULATORY_CARE_PROVIDER_SITE_OTHER): Payer: Medicare Other | Admitting: Physical Therapy

## 2016-10-20 DIAGNOSIS — R52 Pain, unspecified: Secondary | ICD-10-CM | POA: Diagnosis present

## 2016-10-20 DIAGNOSIS — R293 Abnormal posture: Secondary | ICD-10-CM | POA: Diagnosis not present
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
## 2016-10-20 NOTE — Patient Instructions (Signed)
Over Head Pull: Narrow Grip     [place] [phone]   On back, knees bent, feet flat, band across thighs, elbows straight but relaxed. Pull hands apart (start). Keeping elbows straight, bring arms up and over head, hands toward floor. Keep pull steady on band. Hold momentarily. Return slowly, keeping pull steady, back to start. Repeat __10_ times. Band color ___yellow ___   Side Pull: Double Arm   On back, knees bent, feet flat. Arms perpendicular to body, shoulder level, elbows straight but relaxed. Pull arms out to sides, elbows straight. Resistance band comes across collarbones, hands toward floor. Hold momentarily. Slowly return to starting position. Repeat _10__ times. Band color __yellow___   Sash   On back, knees bent, feet flat, left hand on left hip, right hand above left. Pull right arm DIAGONALLY (hip to shoulder) across chest. Bring right arm along head toward floor. Hold momentarily. Slowly return to starting position. Repeat __10_ times. Do with left arm. Band color ___yellow__   Shoulder Rotation: Double Arm   On back, knees bent, feet flat, elbows tucked at sides, bent 90, hands palms up. Pull hands apart and down toward floor, keeping elbows near sides. Hold momentarily. Slowly return to starting position. Repeat _10__ times. Band color __yellow____    

## 2016-10-21 DIAGNOSIS — M9901 Segmental and somatic dysfunction of cervical region: Secondary | ICD-10-CM | POA: Diagnosis not present

## 2016-10-21 DIAGNOSIS — M5414 Radiculopathy, thoracic region: Secondary | ICD-10-CM | POA: Diagnosis not present

## 2016-10-21 DIAGNOSIS — M4722 Other spondylosis with radiculopathy, cervical region: Secondary | ICD-10-CM | POA: Diagnosis not present

## 2016-10-21 DIAGNOSIS — M9902 Segmental and somatic dysfunction of thoracic region: Secondary | ICD-10-CM | POA: Diagnosis not present

## 2016-10-21 NOTE — Addendum Note (Signed)
Addended by: Cassell Clement on: 10/21/2016 02:42 PM   Modules accepted: Orders

## 2016-10-24 ENCOUNTER — Encounter: Payer: Self-pay | Admitting: Physical Therapy

## 2016-10-25 ENCOUNTER — Ambulatory Visit (INDEPENDENT_AMBULATORY_CARE_PROVIDER_SITE_OTHER): Payer: Medicare Other | Admitting: Rehabilitative and Restorative Service Providers"

## 2016-10-25 ENCOUNTER — Encounter: Payer: Self-pay | Admitting: Rehabilitative and Restorative Service Providers"

## 2016-10-25 ENCOUNTER — Encounter: Payer: Self-pay | Admitting: Physical Therapy

## 2016-10-25 DIAGNOSIS — R52 Pain, unspecified: Secondary | ICD-10-CM | POA: Diagnosis present

## 2016-10-25 DIAGNOSIS — R293 Abnormal posture: Secondary | ICD-10-CM | POA: Diagnosis not present
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
## 2016-10-31 ENCOUNTER — Ambulatory Visit (INDEPENDENT_AMBULATORY_CARE_PROVIDER_SITE_OTHER): Payer: Medicare Other | Admitting: Rehabilitative and Restorative Service Providers"

## 2016-10-31 ENCOUNTER — Encounter: Payer: Self-pay | Admitting: Rehabilitative and Restorative Service Providers"

## 2016-10-31 DIAGNOSIS — R52 Pain, unspecified: Secondary | ICD-10-CM | POA: Diagnosis present

## 2016-10-31 DIAGNOSIS — R293 Abnormal posture: Secondary | ICD-10-CM | POA: Diagnosis not present
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
## 2016-11-03 ENCOUNTER — Ambulatory Visit (INDEPENDENT_AMBULATORY_CARE_PROVIDER_SITE_OTHER): Payer: Medicare Other | Admitting: Physical Therapy

## 2016-11-03 ENCOUNTER — Encounter: Payer: Self-pay | Admitting: Physical Therapy

## 2016-11-03 DIAGNOSIS — R52 Pain, unspecified: Secondary | ICD-10-CM | POA: Diagnosis present

## 2016-11-03 DIAGNOSIS — R293 Abnormal posture: Secondary | ICD-10-CM | POA: Diagnosis not present
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
## 2016-11-07 ENCOUNTER — Other Ambulatory Visit: Payer: Self-pay | Admitting: Family Medicine

## 2016-11-07 ENCOUNTER — Encounter: Payer: Medicare Other | Admitting: Physical Therapy

## 2016-11-07 ENCOUNTER — Encounter: Payer: Self-pay | Admitting: Rehabilitative and Restorative Service Providers"

## 2016-11-07 DIAGNOSIS — M4722 Other spondylosis with radiculopathy, cervical region: Secondary | ICD-10-CM | POA: Diagnosis not present

## 2016-11-07 DIAGNOSIS — M9901 Segmental and somatic dysfunction of cervical region: Secondary | ICD-10-CM | POA: Diagnosis not present

## 2016-11-07 DIAGNOSIS — M5414 Radiculopathy, thoracic region: Secondary | ICD-10-CM | POA: Diagnosis not present

## 2016-11-07 DIAGNOSIS — Z08 Encounter for follow-up examination after completed treatment for malignant neoplasm: Secondary | ICD-10-CM | POA: Diagnosis not present

## 2016-11-07 DIAGNOSIS — Z85820 Personal history of malignant melanoma of skin: Secondary | ICD-10-CM | POA: Diagnosis not present

## 2016-11-07 DIAGNOSIS — M9902 Segmental and somatic dysfunction of thoracic region: Secondary | ICD-10-CM | POA: Diagnosis not present

## 2016-11-07 DIAGNOSIS — L0109 Other impetigo: Secondary | ICD-10-CM | POA: Diagnosis not present

## 2016-11-09 ENCOUNTER — Encounter: Payer: Self-pay | Admitting: Rehabilitative and Restorative Service Providers"

## 2016-11-09 ENCOUNTER — Ambulatory Visit (INDEPENDENT_AMBULATORY_CARE_PROVIDER_SITE_OTHER): Payer: Medicare Other | Admitting: Rehabilitative and Restorative Service Providers"

## 2016-11-09 DIAGNOSIS — R52 Pain, unspecified: Secondary | ICD-10-CM

## 2016-11-09 DIAGNOSIS — R293 Abnormal posture: Secondary | ICD-10-CM | POA: Diagnosis not present
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
## 2016-11-14 ENCOUNTER — Encounter: Payer: Self-pay | Admitting: Rehabilitative and Restorative Service Providers"

## 2016-11-14 ENCOUNTER — Ambulatory Visit (INDEPENDENT_AMBULATORY_CARE_PROVIDER_SITE_OTHER): Payer: Medicare Other | Admitting: Rehabilitative and Restorative Service Providers"

## 2016-11-14 DIAGNOSIS — R52 Pain, unspecified: Secondary | ICD-10-CM

## 2016-11-14 DIAGNOSIS — R293 Abnormal posture: Secondary | ICD-10-CM
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
## 2016-11-17 ENCOUNTER — Encounter: Payer: Medicare Other | Admitting: Physical Therapy

## 2016-11-18 ENCOUNTER — Ambulatory Visit (INDEPENDENT_AMBULATORY_CARE_PROVIDER_SITE_OTHER): Payer: Medicare Other | Admitting: Rehabilitative and Restorative Service Providers"

## 2016-11-18 ENCOUNTER — Encounter: Payer: Self-pay | Admitting: Rehabilitative and Restorative Service Providers"

## 2016-11-18 DIAGNOSIS — R52 Pain, unspecified: Secondary | ICD-10-CM | POA: Diagnosis present

## 2016-11-18 DIAGNOSIS — R293 Abnormal posture: Secondary | ICD-10-CM | POA: Diagnosis not present
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
## 2016-11-21 ENCOUNTER — Ambulatory Visit (INDEPENDENT_AMBULATORY_CARE_PROVIDER_SITE_OTHER): Payer: Medicare Other | Admitting: Rehabilitative and Restorative Service Providers"

## 2016-11-21 ENCOUNTER — Encounter: Payer: Self-pay | Admitting: Rehabilitative and Restorative Service Providers"

## 2016-11-21 DIAGNOSIS — R293 Abnormal posture: Secondary | ICD-10-CM | POA: Diagnosis not present

## 2016-11-21 DIAGNOSIS — R52 Pain, unspecified: Secondary | ICD-10-CM
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
## 2016-11-23 DIAGNOSIS — M9901 Segmental and somatic dysfunction of cervical region: Secondary | ICD-10-CM | POA: Diagnosis not present

## 2016-11-23 DIAGNOSIS — M5414 Radiculopathy, thoracic region: Secondary | ICD-10-CM | POA: Diagnosis not present

## 2016-11-23 DIAGNOSIS — M9902 Segmental and somatic dysfunction of thoracic region: Secondary | ICD-10-CM | POA: Diagnosis not present

## 2016-11-23 DIAGNOSIS — M4722 Other spondylosis with radiculopathy, cervical region: Secondary | ICD-10-CM | POA: Diagnosis not present

## 2016-11-30 ENCOUNTER — Encounter: Payer: Medicare Other | Admitting: Physical Therapy

## 2016-11-30 ENCOUNTER — Encounter: Payer: Self-pay | Admitting: Physical Therapy

## 2016-12-05 HISTORY — PX: RETINAL DETACHMENT SURGERY: SHX105

## 2016-12-05 HISTORY — PX: OTHER SURGICAL HISTORY: SHX169

## 2016-12-06 ENCOUNTER — Encounter: Payer: Self-pay | Admitting: Family Medicine

## 2016-12-06 ENCOUNTER — Other Ambulatory Visit: Payer: Self-pay | Admitting: Family Medicine

## 2016-12-07 MED ORDER — TRAMADOL HCL 50 MG PO TABS: 50.0000 mg | ORAL_TABLET | Freq: Four times a day (QID) | ORAL | 0 refills | Status: DC | PRN

## 2016-12-08 ENCOUNTER — Encounter: Payer: Self-pay | Admitting: Physical Therapy

## 2016-12-09 DIAGNOSIS — M9901 Segmental and somatic dysfunction of cervical region: Secondary | ICD-10-CM | POA: Diagnosis not present

## 2016-12-09 DIAGNOSIS — M5414 Radiculopathy, thoracic region: Secondary | ICD-10-CM | POA: Diagnosis not present

## 2016-12-09 DIAGNOSIS — M4722 Other spondylosis with radiculopathy, cervical region: Secondary | ICD-10-CM | POA: Diagnosis not present

## 2016-12-09 DIAGNOSIS — M9902 Segmental and somatic dysfunction of thoracic region: Secondary | ICD-10-CM | POA: Diagnosis not present

## 2016-12-15 ENCOUNTER — Ambulatory Visit (INDEPENDENT_AMBULATORY_CARE_PROVIDER_SITE_OTHER): Payer: Medicare Other | Admitting: Rehabilitative and Restorative Service Providers"

## 2016-12-15 ENCOUNTER — Encounter: Payer: Self-pay | Admitting: Rehabilitative and Restorative Service Providers"

## 2016-12-15 DIAGNOSIS — R293 Abnormal posture: Secondary | ICD-10-CM | POA: Diagnosis not present

## 2016-12-15 DIAGNOSIS — R52 Pain, unspecified: Secondary | ICD-10-CM | POA: Diagnosis present
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
## 2016-12-29 DIAGNOSIS — M9901 Segmental and somatic dysfunction of cervical region: Secondary | ICD-10-CM | POA: Diagnosis not present

## 2016-12-29 DIAGNOSIS — M9902 Segmental and somatic dysfunction of thoracic region: Secondary | ICD-10-CM | POA: Diagnosis not present

## 2016-12-29 DIAGNOSIS — M4722 Other spondylosis with radiculopathy, cervical region: Secondary | ICD-10-CM | POA: Diagnosis not present

## 2016-12-29 DIAGNOSIS — M5414 Radiculopathy, thoracic region: Secondary | ICD-10-CM | POA: Diagnosis not present

## 2017-01-09 ENCOUNTER — Other Ambulatory Visit: Payer: Self-pay | Admitting: Family Medicine

## 2017-01-09 DIAGNOSIS — R208 Other disturbances of skin sensation: Secondary | ICD-10-CM | POA: Diagnosis not present

## 2017-01-09 DIAGNOSIS — R200 Anesthesia of skin: Secondary | ICD-10-CM | POA: Diagnosis not present

## 2017-01-10 ENCOUNTER — Other Ambulatory Visit: Payer: Self-pay | Admitting: Family Medicine

## 2017-01-13 ENCOUNTER — Encounter: Payer: Self-pay | Admitting: Family Medicine

## 2017-01-16 ENCOUNTER — Ambulatory Visit (INDEPENDENT_AMBULATORY_CARE_PROVIDER_SITE_OTHER): Payer: Medicare Other

## 2017-01-16 ENCOUNTER — Ambulatory Visit (INDEPENDENT_AMBULATORY_CARE_PROVIDER_SITE_OTHER): Payer: Medicare Other | Admitting: Physician Assistant

## 2017-01-16 ENCOUNTER — Telehealth: Payer: Self-pay | Admitting: Family Medicine

## 2017-01-16 VITALS — BP 159/82 | HR 82 | Temp 98.0°F | Resp 16 | Ht 66.0 in | Wt 132.0 lb

## 2017-01-16 DIAGNOSIS — J181 Lobar pneumonia, unspecified organism: Secondary | ICD-10-CM

## 2017-01-16 DIAGNOSIS — R918 Other nonspecific abnormal finding of lung field: Secondary | ICD-10-CM | POA: Diagnosis not present

## 2017-01-16 DIAGNOSIS — R6883 Chills (without fever): Secondary | ICD-10-CM

## 2017-01-16 DIAGNOSIS — R509 Fever, unspecified: Secondary | ICD-10-CM | POA: Diagnosis not present

## 2017-01-16 DIAGNOSIS — R0602 Shortness of breath: Secondary | ICD-10-CM

## 2017-01-16 DIAGNOSIS — J189 Pneumonia, unspecified organism: Secondary | ICD-10-CM

## 2017-01-16 NOTE — Telephone Encounter (Signed)
Please call patient: There was a little bit of asymmetry on the right leg for the pressure in her upper thigh/leg area. If she's not having any symptoms in that area than they did not recommend any further workup. As I remember her specific complaint was coldness of the feet so I think this is unrelated and not worrisome. No evidence of peripheral vascular disease otherwise. As we discussed previously this could be Raynaud's disease.

## 2017-01-16 NOTE — Progress Notes (Signed)
Reports discomfort breathing at bedtime since 12/30/16. Yesterday she ran fever with high 103. Has been taking tylenol and not retaken temp. Wonders if she has influenza since she cannot take immunization. Smiling. No distress.

## 2017-01-16 NOTE — Telephone Encounter (Signed)
Pt had US Venous Img Lower Bilateral completed at [facility]. Pt would like those results. Advised they would have to be faxed to [place] and once we get them we will contact her. Verbalized understanding.

## 2017-01-16 NOTE — Patient Instructions (Signed)
sprionmetry Tuesday or Wednesday.

## 2017-01-16 NOTE — Telephone Encounter (Signed)
Left VM for Pt to return clinic call regarding results, callback information provided. 

## 2017-01-17 ENCOUNTER — Encounter: Payer: Self-pay | Admitting: Physician Assistant

## 2017-01-17 ENCOUNTER — Encounter: Payer: Self-pay | Admitting: Family Medicine

## 2017-01-17 ENCOUNTER — Other Ambulatory Visit: Payer: Self-pay | Admitting: Physician Assistant

## 2017-01-17 ENCOUNTER — Other Ambulatory Visit: Payer: Medicare Other

## 2017-01-17 MED ORDER — LEVOFLOXACIN 750 MG PO TABS
750.0000 mg | ORAL_TABLET | Freq: Every day | ORAL | 0 refills | Status: DC
Start: 2017-01-17 — End: 2017-01-23

## 2017-01-17 NOTE — Progress Notes (Signed)
Reviewed allergies and see reaction to doxycycline. Ask about levaquin.

## 2017-01-17 NOTE — Progress Notes (Signed)
   Subjective:    Patient ID: Breanna Casey, female    DOB: 1956/08/10, 61 y.o.   MRN: [id number]  HPI Pt is a 61 yo female who presents to the clinic with SOB with exertion. For 2 weeks her "lungs have hurt with deep breathing". Last night she ran a high temperature of 103 but she has not ran any temperature today. She had sinus pressure last night that was "severe" but resolved today. At rest she feels fine but with exertion she is very short of breath. She denies any lower extremity swelling. She denies any body aches today but has had over the past 2 weeks. She denies any cough or wheezing. She has only tried tylenol for relief of symptoms.    Review of Systems  Constitutional: Positive for chills, fatigue and fever. Negative for appetite change.       Objective:   Physical Exam  Constitutional: She is oriented to person, place, and time. She appears well-developed and well-nourished.  Pt smiled and did not seem distressed.   HENT:  Head: Normocephalic and atraumatic.  Right Ear: External ear normal.  Left Ear: External ear normal.  Nose: Nose normal.  Mouth/Throat: Oropharynx is clear and moist. No oropharyngeal exudate.  TM's clear.  Negative for any sinus tenderness.  Nasal turbinates red and swollen.   Eyes: Conjunctivae are normal. Right eye exhibits no discharge. Left eye exhibits no discharge.  Neck: Normal range of motion. Neck supple.  Cardiovascular: Normal rate, regular rhythm and normal heart sounds.   Pulmonary/Chest: Effort normal and breath sounds normal. She has no wheezes. She has no rales. She exhibits no tenderness.  Lymphadenopathy:    She has no cervical adenopathy.  Neurological: She is alert and oriented to person, place, and time.  Psychiatric: She has a normal mood and affect. Her behavior is normal.          Assessment & Plan:  Marland KitchenMarland KitchenSaragrace was seen today for fever, chills and shortness of breath.  Diagnoses and all orders for this  visit:  Shortness of breath -     Cancel: POC Influenza A&B (Binax test) -     DG Chest 2 View  Fever, unspecified fever cause -     DG Chest 2 View  Chills -     DG Chest 2 View   Decided to cancel flu test. Pt appears "much better today". Symptoms have been present for 2 weeks so less likely the flu. No fever today. Pulse ox 100 percent at rest. We did walk test today and after 3 laps did drop to 87 percent;however, pt only felt minimally SOB. This is unusual. Will get CXR. Discussed if CXR is normal get spirometry. Lung exam was normal today.   CXR showed right middle lobe possible pneumonia. Called pt with information. Call pt would like to consider levaquin. I want to confirm she has not had any reactions in the past.  Recheck in 1-2 weeks sooner if not improving.

## 2017-01-17 NOTE — Progress Notes (Signed)
Sent levaquin to pharmacy.

## 2017-01-18 ENCOUNTER — Encounter: Payer: Self-pay | Admitting: *Deleted

## 2017-01-18 NOTE — Telephone Encounter (Signed)
I advised patient of ABI information. She required clarification and I apologized for the miscommunication.

## 2017-01-22 ENCOUNTER — Encounter: Payer: Self-pay | Admitting: Physician Assistant

## 2017-01-23 MED ORDER — LEVOFLOXACIN 750 MG PO TABS: 750.0000 mg | ORAL_TABLET | Freq: Every day | ORAL | 0 refills | Status: DC

## 2017-01-23 NOTE — Telephone Encounter (Signed)
Pt request a few more days of abx. She continues to feel better but not 100 percent. I will send 5 more days.

## 2017-01-25 ENCOUNTER — Other Ambulatory Visit: Payer: Self-pay | Admitting: Physician Assistant

## 2017-01-25 ENCOUNTER — Encounter: Payer: Self-pay | Admitting: Physician Assistant

## 2017-01-25 MED ORDER — FLUCONAZOLE 150 MG PO TABS: 150.0000 mg | ORAL_TABLET | Freq: Once | ORAL | 0 refills | Status: DC

## 2017-01-30 ENCOUNTER — Encounter: Payer: Self-pay | Admitting: Family Medicine

## 2017-01-30 ENCOUNTER — Ambulatory Visit: Payer: Medicare Other | Admitting: Physician Assistant

## 2017-01-30 ENCOUNTER — Ambulatory Visit (INDEPENDENT_AMBULATORY_CARE_PROVIDER_SITE_OTHER): Payer: Medicare Other | Admitting: Family Medicine

## 2017-01-30 ENCOUNTER — Ambulatory Visit (INDEPENDENT_AMBULATORY_CARE_PROVIDER_SITE_OTHER): Payer: Medicare Other

## 2017-01-30 VITALS — BP 144/64 | HR 70 | Ht 66.0 in | Wt 132.0 lb

## 2017-01-30 DIAGNOSIS — R05 Cough: Secondary | ICD-10-CM | POA: Diagnosis not present

## 2017-01-30 DIAGNOSIS — M4722 Other spondylosis with radiculopathy, cervical region: Secondary | ICD-10-CM | POA: Diagnosis not present

## 2017-01-30 DIAGNOSIS — R0602 Shortness of breath: Secondary | ICD-10-CM

## 2017-01-30 DIAGNOSIS — R059 Cough, unspecified: Secondary | ICD-10-CM

## 2017-01-30 DIAGNOSIS — M5414 Radiculopathy, thoracic region: Secondary | ICD-10-CM | POA: Diagnosis not present

## 2017-01-30 DIAGNOSIS — M9901 Segmental and somatic dysfunction of cervical region: Secondary | ICD-10-CM | POA: Diagnosis not present

## 2017-01-30 DIAGNOSIS — M9902 Segmental and somatic dysfunction of thoracic region: Secondary | ICD-10-CM | POA: Diagnosis not present
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
## 2017-02-07 ENCOUNTER — Other Ambulatory Visit: Payer: Self-pay | Admitting: Family Medicine

## 2017-02-07 MED ORDER — TRAMADOL HCL 50 MG PO TABS: 50.0000 mg | ORAL_TABLET | Freq: Four times a day (QID) | ORAL | 0 refills | Status: DC | PRN

## 2017-02-14 DIAGNOSIS — M5414 Radiculopathy, thoracic region: Secondary | ICD-10-CM | POA: Diagnosis not present

## 2017-02-14 DIAGNOSIS — M9902 Segmental and somatic dysfunction of thoracic region: Secondary | ICD-10-CM | POA: Diagnosis not present

## 2017-02-14 DIAGNOSIS — M4722 Other spondylosis with radiculopathy, cervical region: Secondary | ICD-10-CM | POA: Diagnosis not present

## 2017-02-14 DIAGNOSIS — M9901 Segmental and somatic dysfunction of cervical region: Secondary | ICD-10-CM | POA: Diagnosis not present

## 2017-02-25 IMAGING — DX DG CHEST 2V
2 series · 2 of 2 positions shown · non-contrast
Comparison: CT chest 03/26/2015. Single-view of the chest
03/20/2015.

CLINICAL DATA: Shortness of breath for 2 weeks.

EXAM:
CHEST  2 VIEW

[chest pa]
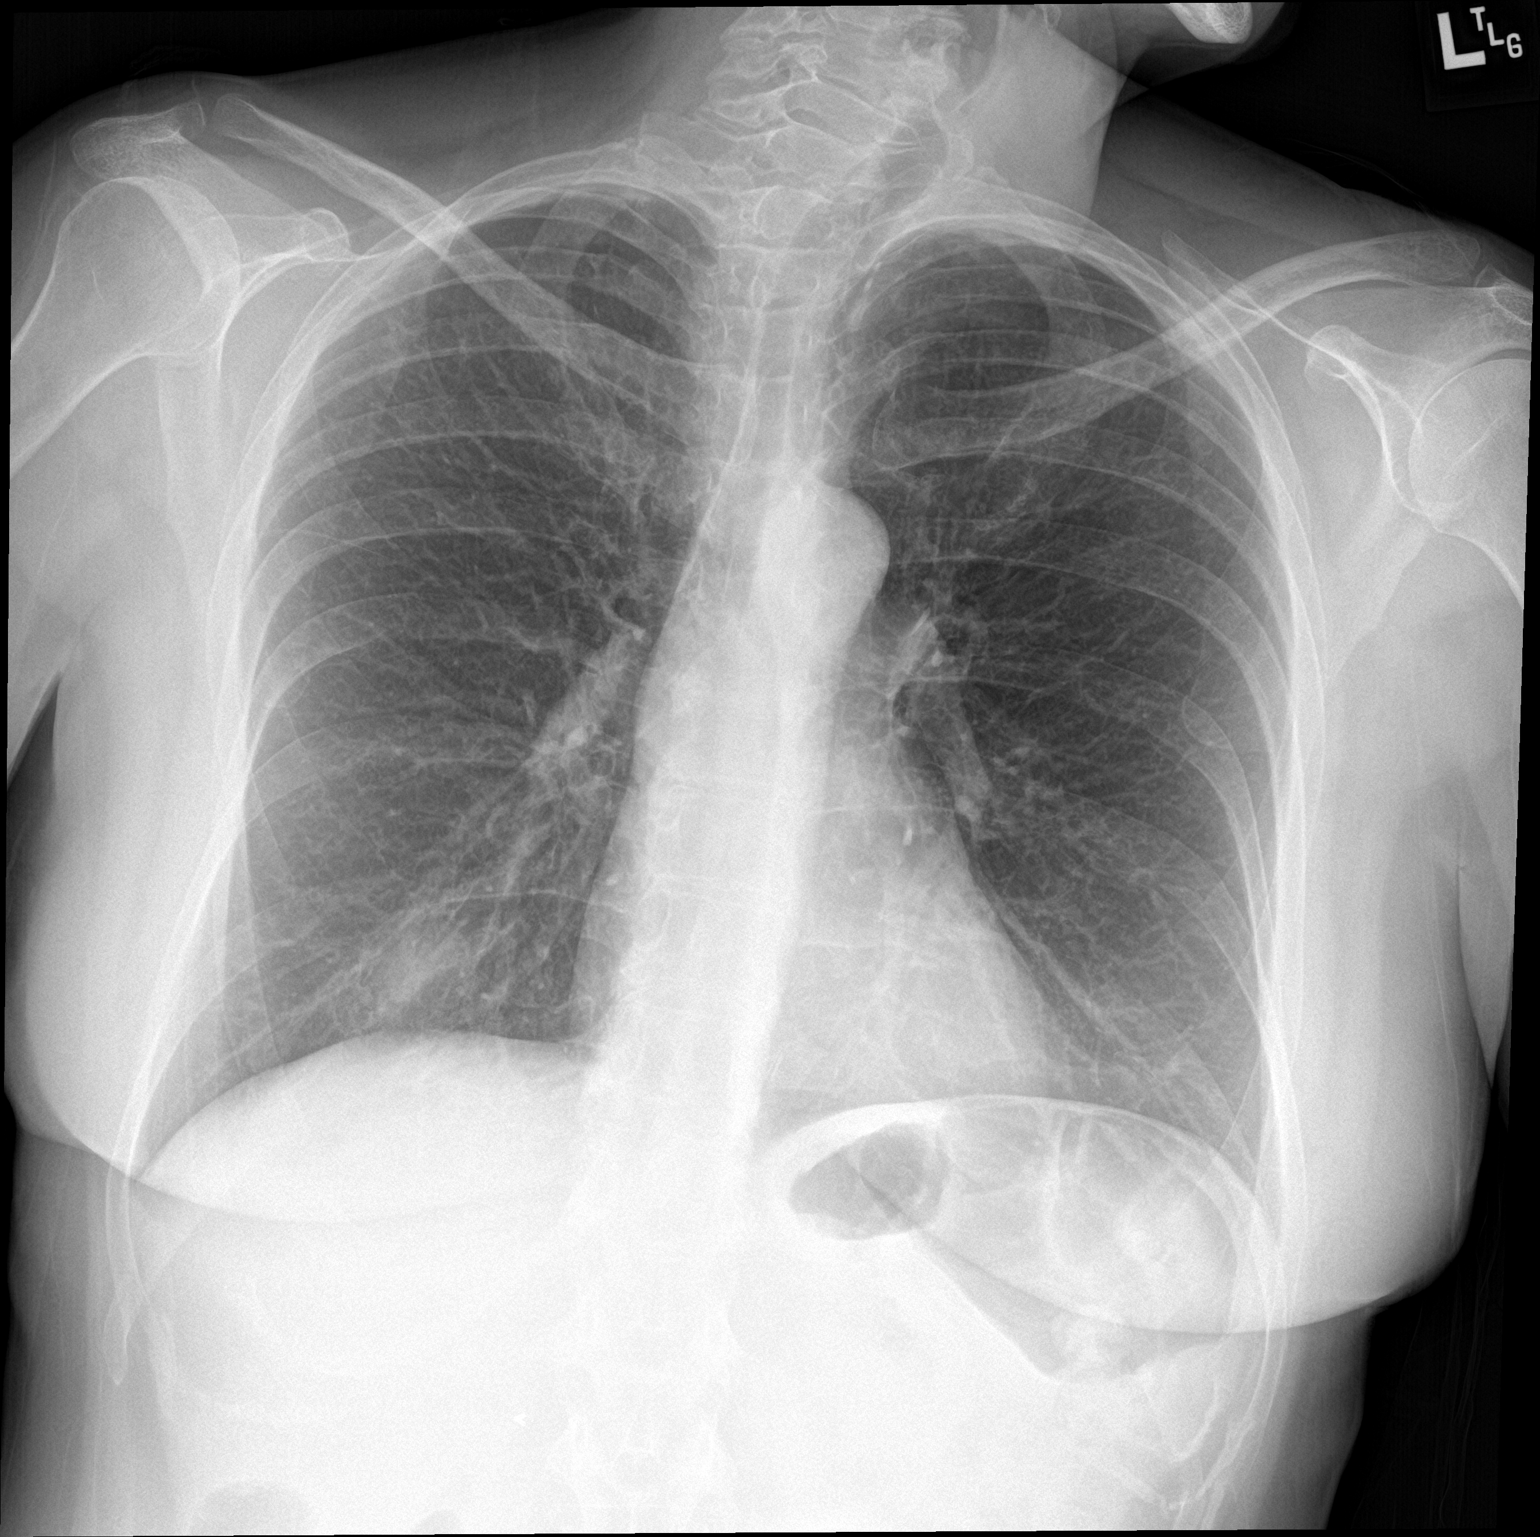

[chest lat]
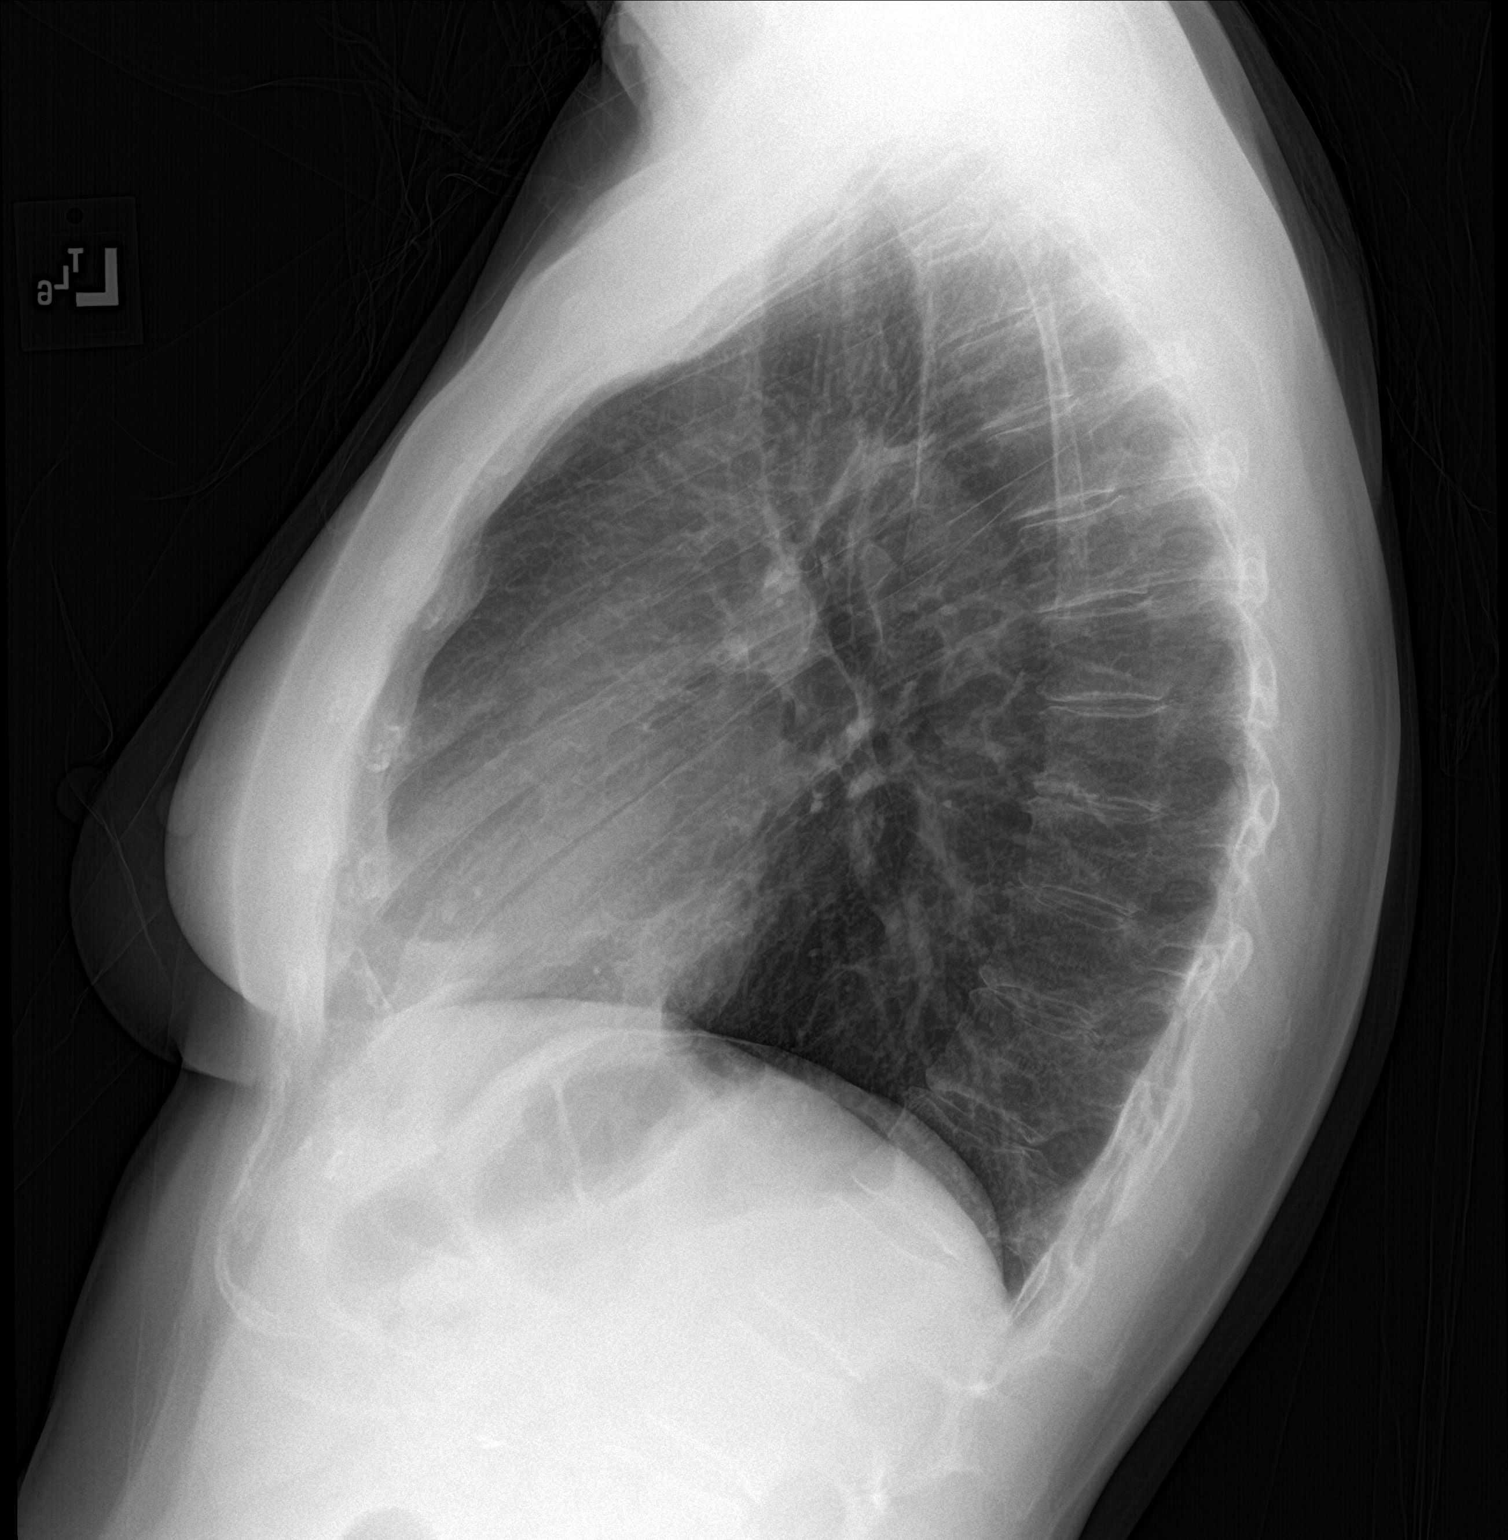

[2 of 2 positions shown; findings below may reference images not displayed]

FINDINGS: Focal airspace disease is seen in the right middle lobe. Left lung
is clear. Heart size is normal. No pneumothorax or pleural effusion.
IMPRESSION: Focal right lower lobe airspace disease worrisome for pneumonia.
Recommend followup to clearing.

## 2017-03-07 DIAGNOSIS — M9901 Segmental and somatic dysfunction of cervical region: Secondary | ICD-10-CM | POA: Diagnosis not present

## 2017-03-07 DIAGNOSIS — M5442 Lumbago with sciatica, left side: Secondary | ICD-10-CM | POA: Diagnosis not present

## 2017-03-07 DIAGNOSIS — M9903 Segmental and somatic dysfunction of lumbar region: Secondary | ICD-10-CM | POA: Diagnosis not present

## 2017-03-07 DIAGNOSIS — M4722 Other spondylosis with radiculopathy, cervical region: Secondary | ICD-10-CM | POA: Diagnosis not present

## 2017-03-08 ENCOUNTER — Other Ambulatory Visit: Payer: Self-pay | Admitting: *Deleted

## 2017-03-08 ENCOUNTER — Other Ambulatory Visit: Payer: Self-pay | Admitting: Family Medicine

## 2017-03-08 MED ORDER — TRAMADOL HCL 50 MG PO TABS: 50.0000 mg | ORAL_TABLET | Freq: Four times a day (QID) | ORAL | 0 refills | Status: DC | PRN

## 2017-03-11 IMAGING — DX DG CHEST 2V
2 series · 2 of 2 positions shown · non-contrast
Comparison: 01/16/2017 .

CLINICAL DATA: Cough.  Shortness of breath .

EXAM:
CHEST  2 VIEW

[chest pa]
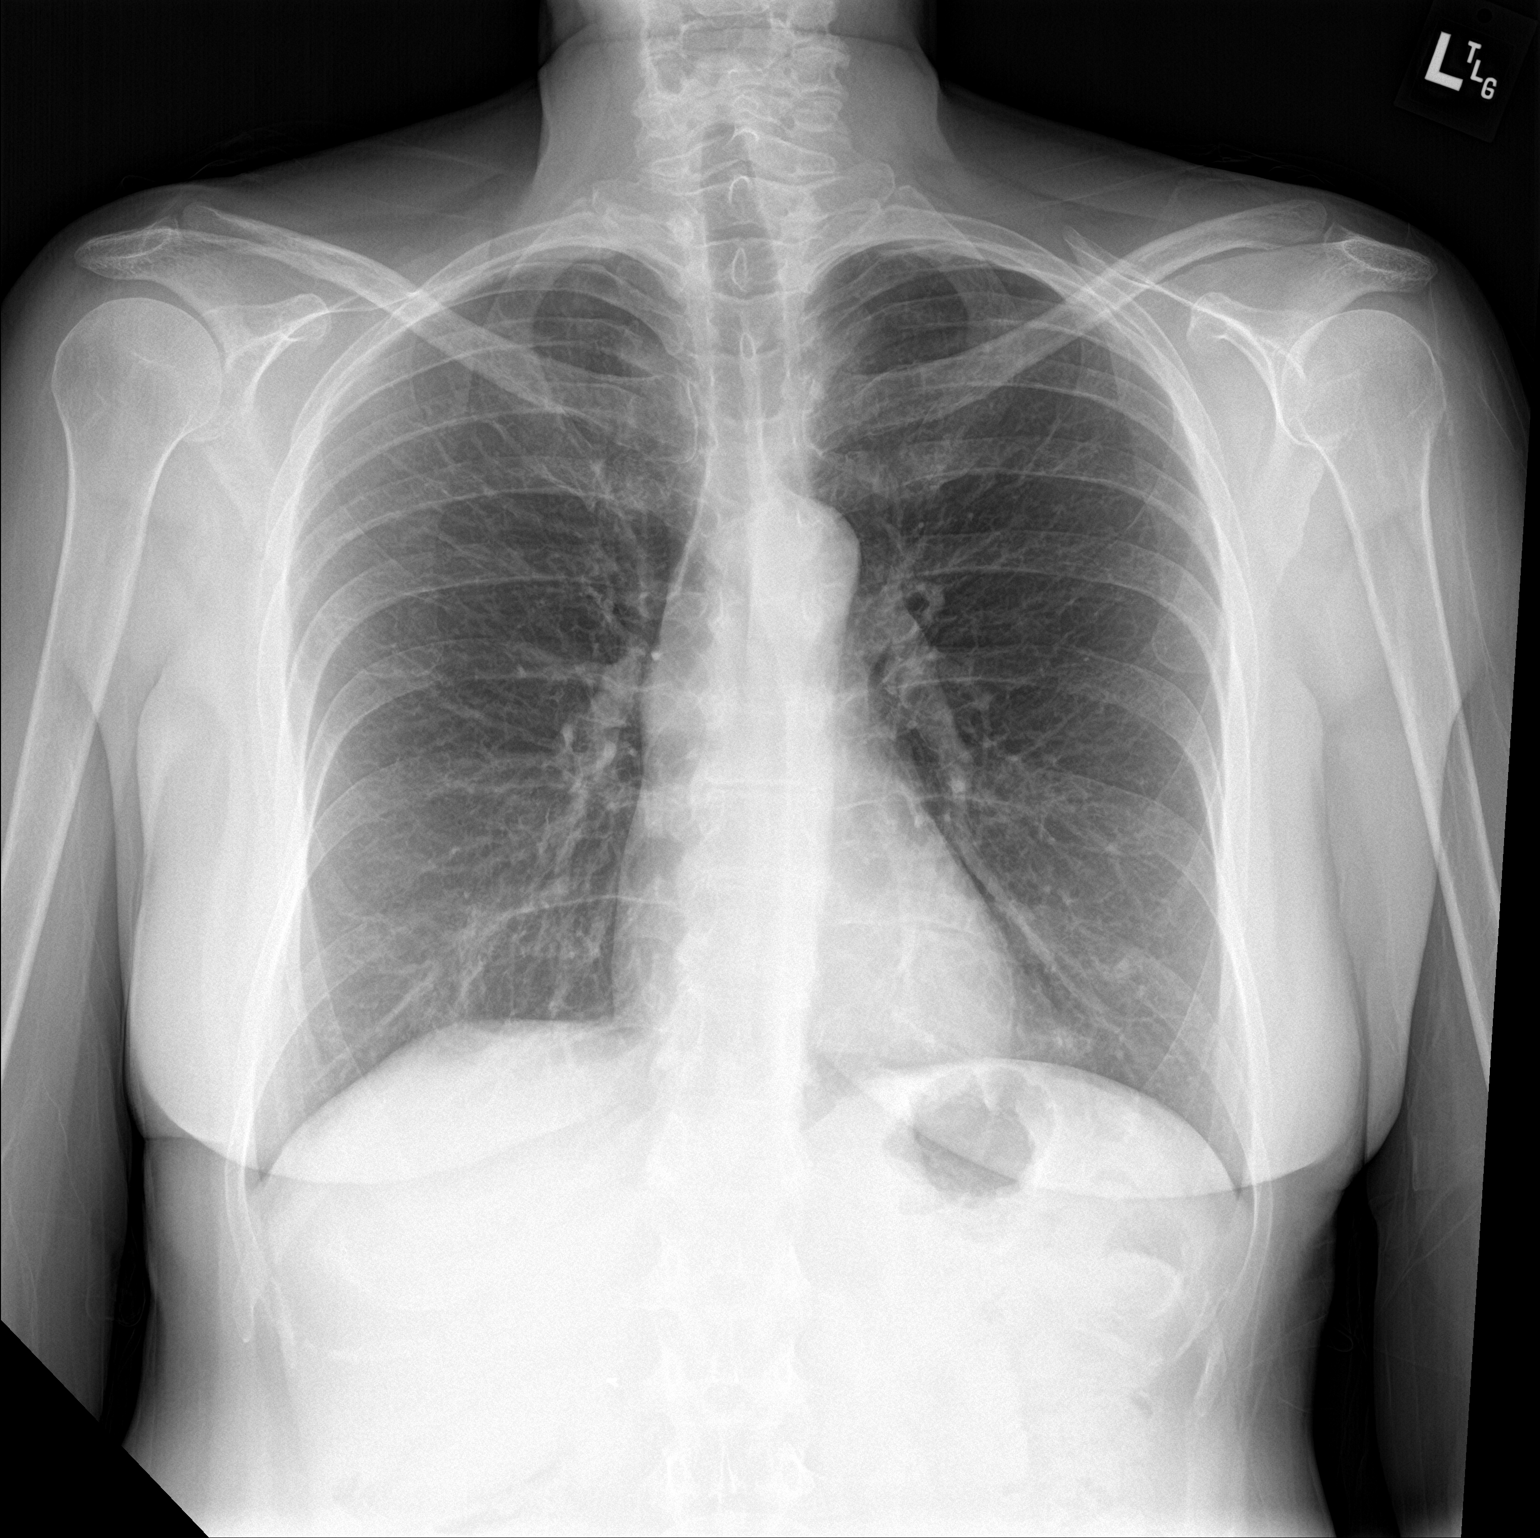

[chest lat]
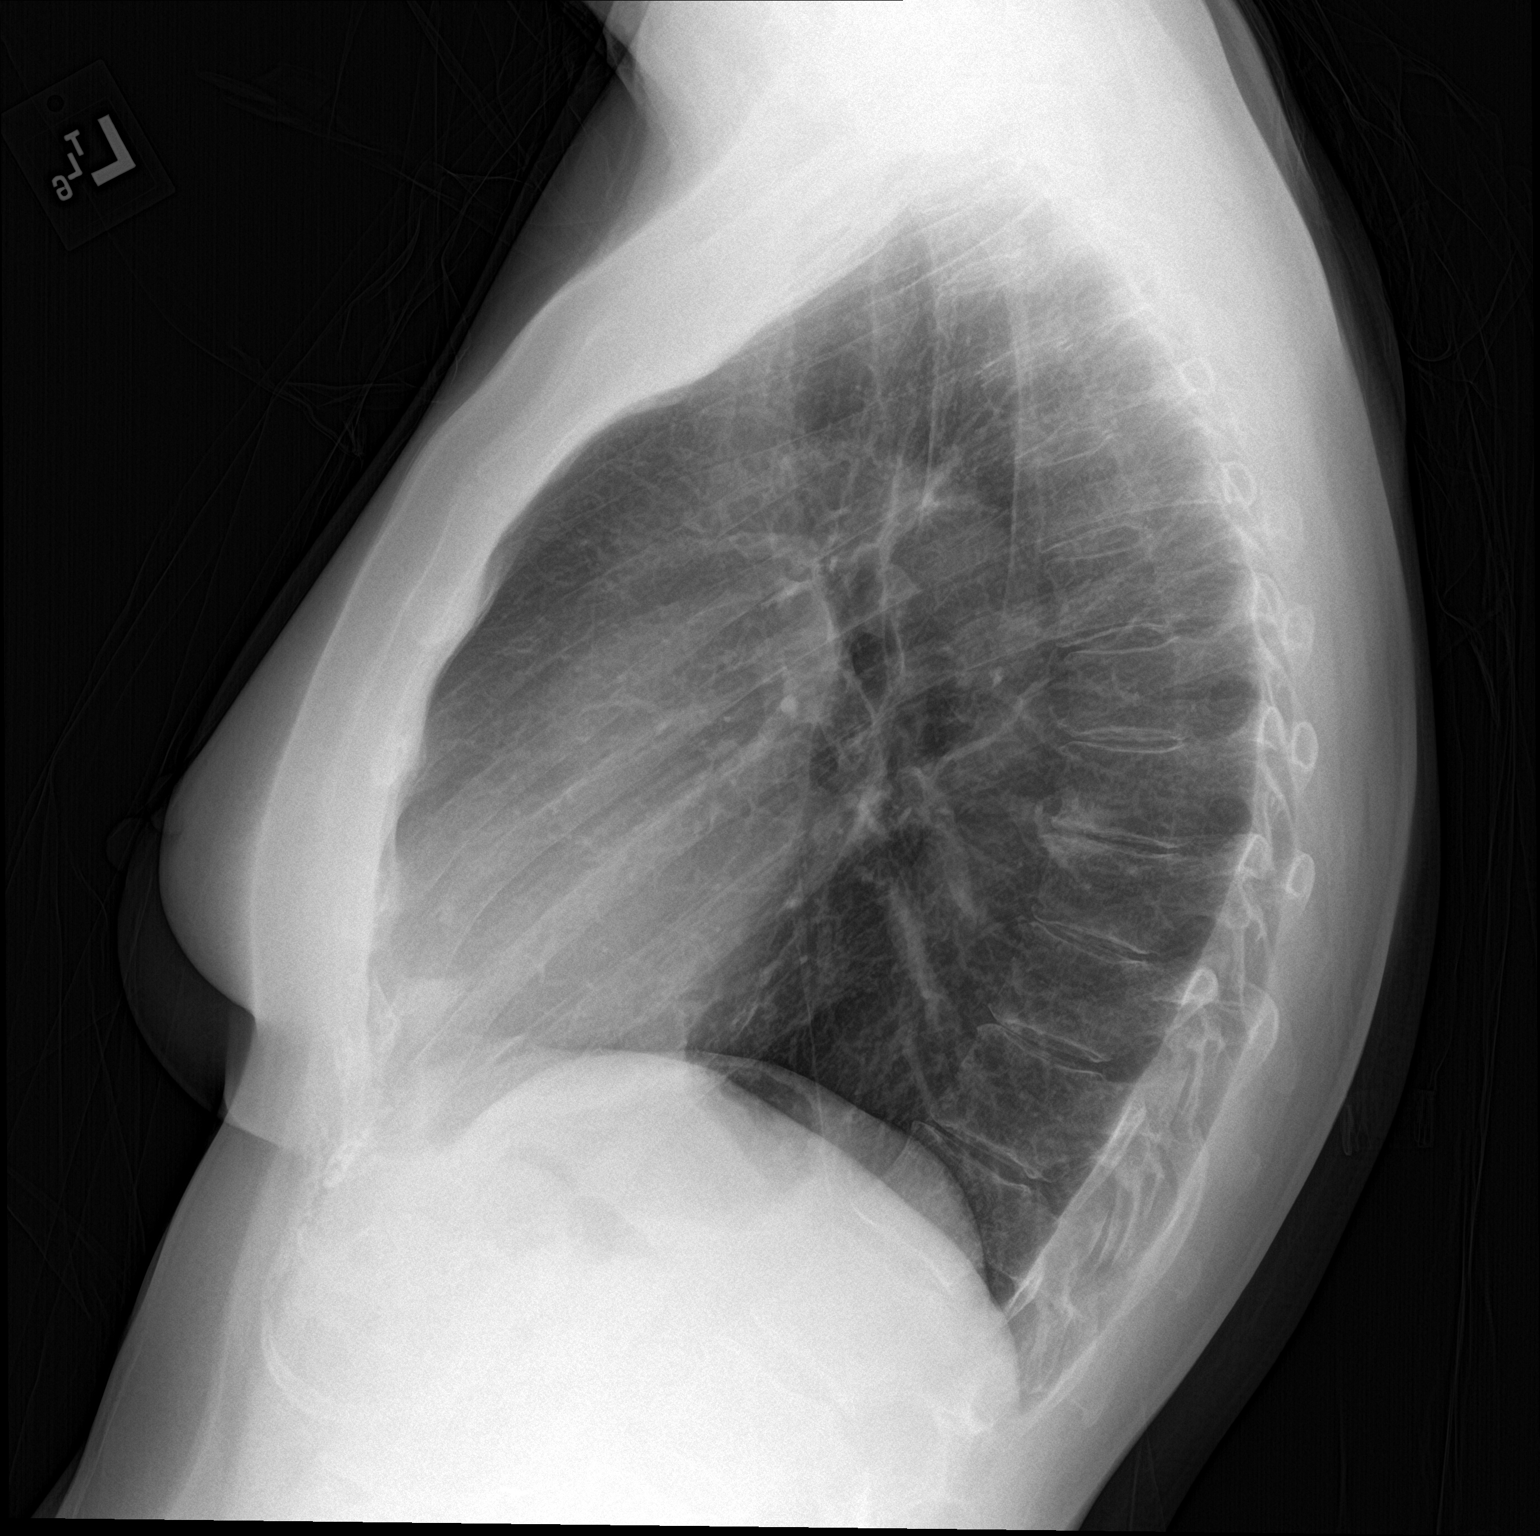

[2 of 2 positions shown; findings below may reference images not displayed]

FINDINGS: Mediastinum hilar structures are normal. Interim near complete
clearing of right base infiltrate. Calcified nodule left base
consistent granuloma. No pleural effusion or pneumothorax. Heart
size normal. Thoracic spine scoliosis and degenerative change
IMPRESSION: Interim near complete clearing of right base infiltrate

## 2017-03-14 DIAGNOSIS — D1030 Benign neoplasm of unspecified part of mouth: Secondary | ICD-10-CM | POA: Diagnosis not present

## 2017-03-15 DIAGNOSIS — M9901 Segmental and somatic dysfunction of cervical region: Secondary | ICD-10-CM | POA: Diagnosis not present

## 2017-03-15 DIAGNOSIS — M9903 Segmental and somatic dysfunction of lumbar region: Secondary | ICD-10-CM | POA: Diagnosis not present

## 2017-03-15 DIAGNOSIS — M4722 Other spondylosis with radiculopathy, cervical region: Secondary | ICD-10-CM | POA: Diagnosis not present

## 2017-03-15 DIAGNOSIS — M5442 Lumbago with sciatica, left side: Secondary | ICD-10-CM | POA: Diagnosis not present

## 2017-03-18 ENCOUNTER — Other Ambulatory Visit: Payer: Self-pay | Admitting: Family Medicine

## 2017-03-24 DIAGNOSIS — M5442 Lumbago with sciatica, left side: Secondary | ICD-10-CM | POA: Diagnosis not present

## 2017-03-24 DIAGNOSIS — M9903 Segmental and somatic dysfunction of lumbar region: Secondary | ICD-10-CM | POA: Diagnosis not present

## 2017-03-24 DIAGNOSIS — M4722 Other spondylosis with radiculopathy, cervical region: Secondary | ICD-10-CM | POA: Diagnosis not present

## 2017-03-24 DIAGNOSIS — M9901 Segmental and somatic dysfunction of cervical region: Secondary | ICD-10-CM | POA: Diagnosis not present

## 2017-03-28 DIAGNOSIS — L910 Hypertrophic scar: Secondary | ICD-10-CM | POA: Diagnosis not present

## 2017-04-04 ENCOUNTER — Other Ambulatory Visit: Payer: Self-pay | Admitting: Family Medicine

## 2017-04-04 MED ORDER — TRAMADOL HCL 50 MG PO TABS: 50.0000 mg | ORAL_TABLET | Freq: Four times a day (QID) | ORAL | 0 refills | Status: DC | PRN

## 2017-04-11 DIAGNOSIS — H43312 Vitreous membranes and strands, left eye: Secondary | ICD-10-CM | POA: Diagnosis not present

## 2017-04-11 DIAGNOSIS — H4312 Vitreous hemorrhage, left eye: Secondary | ICD-10-CM | POA: Diagnosis not present

## 2017-04-11 DIAGNOSIS — H43392 Other vitreous opacities, left eye: Secondary | ICD-10-CM | POA: Diagnosis not present

## 2017-04-13 DIAGNOSIS — H43812 Vitreous degeneration, left eye: Secondary | ICD-10-CM | POA: Diagnosis not present

## 2017-04-13 DIAGNOSIS — H35361 Drusen (degenerative) of macula, right eye: Secondary | ICD-10-CM | POA: Diagnosis not present

## 2017-04-13 DIAGNOSIS — H4312 Vitreous hemorrhage, left eye: Secondary | ICD-10-CM | POA: Diagnosis not present

## 2017-04-13 DIAGNOSIS — H31092 Other chorioretinal scars, left eye: Secondary | ICD-10-CM | POA: Diagnosis not present

## 2017-04-14 DIAGNOSIS — Z9889 Other specified postprocedural states: Secondary | ICD-10-CM | POA: Insufficient documentation

## 2017-04-18 DIAGNOSIS — H4312 Vitreous hemorrhage, left eye: Secondary | ICD-10-CM | POA: Diagnosis not present

## 2017-05-08 ENCOUNTER — Other Ambulatory Visit: Payer: Self-pay | Admitting: Family Medicine

## 2017-05-08 ENCOUNTER — Encounter: Payer: Self-pay | Admitting: Physical Therapy

## 2017-05-08 DIAGNOSIS — Z08 Encounter for follow-up examination after completed treatment for malignant neoplasm: Secondary | ICD-10-CM | POA: Diagnosis not present

## 2017-05-08 DIAGNOSIS — Z85820 Personal history of malignant melanoma of skin: Secondary | ICD-10-CM | POA: Diagnosis not present

## 2017-05-08 DIAGNOSIS — D225 Melanocytic nevi of trunk: Secondary | ICD-10-CM | POA: Diagnosis not present

## 2017-05-09 ENCOUNTER — Other Ambulatory Visit: Payer: Self-pay | Admitting: Family Medicine

## 2017-05-09 DIAGNOSIS — L910 Hypertrophic scar: Secondary | ICD-10-CM | POA: Diagnosis not present

## 2017-05-10 NOTE — Telephone Encounter (Signed)
OK to fill since has an appt.

## 2017-05-12 ENCOUNTER — Other Ambulatory Visit: Payer: Self-pay | Admitting: Family Medicine

## 2017-05-12 MED ORDER — TRAMADOL HCL 50 MG PO TABS: 50.0000 mg | ORAL_TABLET | Freq: Four times a day (QID) | ORAL | 0 refills | Status: DC | PRN

## 2017-05-22 DIAGNOSIS — H43812 Vitreous degeneration, left eye: Secondary | ICD-10-CM | POA: Diagnosis not present

## 2017-05-24 DIAGNOSIS — R1031 Right lower quadrant pain: Secondary | ICD-10-CM | POA: Diagnosis not present

## 2017-05-24 DIAGNOSIS — N952 Postmenopausal atrophic vaginitis: Secondary | ICD-10-CM | POA: Diagnosis not present

## 2017-05-24 DIAGNOSIS — N6019 Diffuse cystic mastopathy of unspecified breast: Secondary | ICD-10-CM | POA: Diagnosis not present

## 2017-05-30 ENCOUNTER — Ambulatory Visit (INDEPENDENT_AMBULATORY_CARE_PROVIDER_SITE_OTHER): Payer: Medicare Other | Admitting: Family Medicine

## 2017-05-30 ENCOUNTER — Encounter: Payer: Self-pay | Admitting: Family Medicine

## 2017-05-30 VITALS — BP 163/85 | HR 83 | Ht 66.0 in | Wt 136.0 lb

## 2017-05-30 DIAGNOSIS — M545 Low back pain, unspecified: Secondary | ICD-10-CM

## 2017-05-30 DIAGNOSIS — M797 Fibromyalgia: Secondary | ICD-10-CM

## 2017-05-30 DIAGNOSIS — H3322 Serous retinal detachment, left eye: Secondary | ICD-10-CM | POA: Diagnosis not present

## 2017-05-30 DIAGNOSIS — R1031 Right lower quadrant pain: Secondary | ICD-10-CM

## 2017-05-30 DIAGNOSIS — H3530 Unspecified macular degeneration: Secondary | ICD-10-CM

## 2017-05-30 DIAGNOSIS — E7800 Pure hypercholesterolemia, unspecified: Secondary | ICD-10-CM | POA: Diagnosis not present

## 2017-05-30 DIAGNOSIS — R590 Localized enlarged lymph nodes: Secondary | ICD-10-CM | POA: Diagnosis not present

## 2017-05-30 DIAGNOSIS — R030 Elevated blood-pressure reading, without diagnosis of hypertension: Secondary | ICD-10-CM

## 2017-05-30 DIAGNOSIS — M25519 Pain in unspecified shoulder: Secondary | ICD-10-CM

## 2017-05-30 DIAGNOSIS — G8929 Other chronic pain: Secondary | ICD-10-CM | POA: Diagnosis not present

## 2017-05-30 MED ORDER — TRAMADOL HCL 50 MG PO TABS: 50.0000 mg | ORAL_TABLET | Freq: Four times a day (QID) | ORAL | 3 refills | Status: DC | PRN
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
## 2017-06-06 DIAGNOSIS — L910 Hypertrophic scar: Secondary | ICD-10-CM | POA: Diagnosis not present

## 2017-06-11 ENCOUNTER — Encounter: Payer: Self-pay | Admitting: Family Medicine

## 2017-06-13 DIAGNOSIS — R590 Localized enlarged lymph nodes: Secondary | ICD-10-CM | POA: Diagnosis not present

## 2017-06-13 DIAGNOSIS — E7800 Pure hypercholesterolemia, unspecified: Secondary | ICD-10-CM | POA: Diagnosis not present

## 2017-06-13 DIAGNOSIS — R1031 Right lower quadrant pain: Secondary | ICD-10-CM | POA: Diagnosis not present

## 2017-06-13 DIAGNOSIS — G8929 Other chronic pain: Secondary | ICD-10-CM | POA: Diagnosis not present

## 2017-06-13 DIAGNOSIS — R030 Elevated blood-pressure reading, without diagnosis of hypertension: Secondary | ICD-10-CM | POA: Diagnosis not present

## 2017-06-14 ENCOUNTER — Telehealth: Payer: Self-pay

## 2017-06-14 LAB — CBC WITH DIFFERENTIAL/PLATELET
Basophils Absolute: 0 cells/uL (ref 0–200)
Basophils Relative: 0 %
Eosinophils Absolute: 56 cells/uL (ref 15–500)
Eosinophils Relative: 1 %
HCT: 44.5 % (ref 35.0–45.0)
Hemoglobin: 15 g/dL (ref 11.7–15.5)
Lymphocytes Relative: 29 %
Lymphs Abs: 1624 cells/uL (ref 850–3900)
MCH: 29.9 pg (ref 27.0–33.0)
MCHC: 33.7 g/dL (ref 32.0–36.0)
MCV: 88.6 fL (ref 80.0–100.0)
MPV: 9.8 fL (ref 7.5–12.5)
Monocytes Absolute: 504 cells/uL (ref 200–950)
Monocytes Relative: 9 %
Neutro Abs: 3416 cells/uL (ref 1500–7800)
Neutrophils Relative %: 61 %
Platelets: 255 10*3/uL (ref 140–400)
RBC: 5.02 MIL/uL (ref 3.80–5.10)
RDW: 13 % (ref 11.0–15.0)
WBC: 5.6 10*3/uL (ref 3.8–10.8)

## 2017-06-14 LAB — COMPLETE METABOLIC PANEL WITH GFR
ALT: 29 U/L (ref 6–29)
AST: 30 U/L (ref 10–35)
Albumin: 5.1 g/dL (ref 3.6–5.1)
Alkaline Phosphatase: 89 U/L (ref 33–130)
BUN: 5 mg/dL — ABNORMAL LOW (ref 7–25)
CO2: 26 mmol/L (ref 20–31)
Calcium: 9.9 mg/dL (ref 8.6–10.4)
Chloride: 101 mmol/L (ref 98–110)
Creat: 0.68 mg/dL (ref 0.50–0.99)
GFR, Est African American: >89 mL/min (ref >=60)
GFR, Est Non African American: >89 mL/min (ref >=60)
Glucose, Bld: 84 mg/dL (ref 65–99)
Potassium: 4 mmol/L (ref 3.5–5.3)
Sodium: 139 mmol/L (ref 135–146)
Total Bilirubin: 0.4 mg/dL (ref 0.2–1.2)
Total Protein: 7.7 g/dL (ref 6.1–8.1)

## 2017-06-14 LAB — LIPID PANEL
Cholesterol: 235 mg/dL — ABNORMAL HIGH (ref <200)
HDL: 106 mg/dL (ref >50)
LDL Cholesterol: 119 mg/dL — ABNORMAL HIGH (ref <100)
Total CHOL/HDL Ratio: 2.2 Ratio (ref <5.0)
Triglycerides: 51 mg/dL (ref <150)
VLDL: 10 mg/dL (ref <30)

## 2017-06-14 NOTE — Telephone Encounter (Signed)
Error -EH/RMA  

## 2017-06-22 DIAGNOSIS — Z1231 Encounter for screening mammogram for malignant neoplasm of breast: Secondary | ICD-10-CM | POA: Diagnosis not present

## 2017-06-22 LAB — HM MAMMOGRAPHY

## 2017-07-06 DIAGNOSIS — R1031 Right lower quadrant pain: Secondary | ICD-10-CM | POA: Diagnosis not present

## 2017-07-06 DIAGNOSIS — K219 Gastro-esophageal reflux disease without esophagitis: Secondary | ICD-10-CM | POA: Diagnosis not present

## 2017-07-06 DIAGNOSIS — R1084 Generalized abdominal pain: Secondary | ICD-10-CM | POA: Diagnosis not present

## 2017-07-06 DIAGNOSIS — K589 Irritable bowel syndrome without diarrhea: Secondary | ICD-10-CM | POA: Diagnosis not present

## 2017-07-07 ENCOUNTER — Encounter: Payer: Self-pay | Admitting: Family Medicine

## 2017-07-07 DIAGNOSIS — I700 Atherosclerosis of aorta: Secondary | ICD-10-CM | POA: Diagnosis not present

## 2017-07-07 DIAGNOSIS — R1031 Right lower quadrant pain: Secondary | ICD-10-CM | POA: Diagnosis not present

## 2017-07-07 DIAGNOSIS — K219 Gastro-esophageal reflux disease without esophagitis: Secondary | ICD-10-CM | POA: Diagnosis not present

## 2017-07-07 DIAGNOSIS — K3189 Other diseases of stomach and duodenum: Secondary | ICD-10-CM | POA: Diagnosis not present

## 2017-07-07 DIAGNOSIS — Z86010 Personal history of colonic polyps: Secondary | ICD-10-CM | POA: Diagnosis not present

## 2017-07-07 DIAGNOSIS — R109 Unspecified abdominal pain: Secondary | ICD-10-CM | POA: Diagnosis not present

## 2017-07-07 DIAGNOSIS — Z90710 Acquired absence of both cervix and uterus: Secondary | ICD-10-CM | POA: Diagnosis not present

## 2017-07-17 DIAGNOSIS — M542 Cervicalgia: Secondary | ICD-10-CM | POA: Diagnosis not present

## 2017-07-17 DIAGNOSIS — M6281 Muscle weakness (generalized): Secondary | ICD-10-CM | POA: Diagnosis not present

## 2017-07-17 DIAGNOSIS — M545 Low back pain: Secondary | ICD-10-CM | POA: Diagnosis not present

## 2017-07-24 DIAGNOSIS — M542 Cervicalgia: Secondary | ICD-10-CM | POA: Diagnosis not present

## 2017-07-24 DIAGNOSIS — M6281 Muscle weakness (generalized): Secondary | ICD-10-CM | POA: Diagnosis not present

## 2017-07-24 DIAGNOSIS — M545 Low back pain: Secondary | ICD-10-CM | POA: Diagnosis not present

## 2017-07-26 ENCOUNTER — Encounter: Payer: Self-pay | Admitting: Family Medicine

## 2017-07-28 ENCOUNTER — Other Ambulatory Visit: Payer: Self-pay | Admitting: *Deleted

## 2017-07-28 MED ORDER — PREDNISONE 1 MG PO TABS: 1.0000 mg | ORAL_TABLET | Freq: Every day | ORAL | 0 refills | Status: AC | PRN

## 2017-07-31 DIAGNOSIS — H31092 Other chorioretinal scars, left eye: Secondary | ICD-10-CM | POA: Diagnosis not present

## 2017-07-31 DIAGNOSIS — H35363 Drusen (degenerative) of macula, bilateral: Secondary | ICD-10-CM | POA: Diagnosis not present

## 2017-07-31 DIAGNOSIS — H43811 Vitreous degeneration, right eye: Secondary | ICD-10-CM | POA: Diagnosis not present

## 2017-08-02 DIAGNOSIS — M542 Cervicalgia: Secondary | ICD-10-CM | POA: Diagnosis not present

## 2017-08-02 DIAGNOSIS — M545 Low back pain: Secondary | ICD-10-CM | POA: Diagnosis not present

## 2017-08-02 DIAGNOSIS — M6281 Muscle weakness (generalized): Secondary | ICD-10-CM | POA: Diagnosis not present

## 2017-08-08 ENCOUNTER — Encounter: Payer: Self-pay | Admitting: Family Medicine

## 2017-08-09 ENCOUNTER — Other Ambulatory Visit: Payer: Self-pay | Admitting: Family Medicine

## 2017-08-09 DIAGNOSIS — M542 Cervicalgia: Secondary | ICD-10-CM | POA: Diagnosis not present

## 2017-08-09 DIAGNOSIS — M545 Low back pain: Secondary | ICD-10-CM | POA: Diagnosis not present

## 2017-08-09 DIAGNOSIS — M6281 Muscle weakness (generalized): Secondary | ICD-10-CM | POA: Diagnosis not present

## 2017-08-09 MED ORDER — PREDNISONE 20 MG PO TABS: ORAL_TABLET | ORAL | 1 refills | Status: DC

## 2017-08-12 ENCOUNTER — Encounter: Payer: Self-pay | Admitting: Family Medicine

## 2017-08-14 ENCOUNTER — Encounter: Payer: Self-pay | Admitting: Physician Assistant

## 2017-08-14 ENCOUNTER — Ambulatory Visit (INDEPENDENT_AMBULATORY_CARE_PROVIDER_SITE_OTHER): Payer: Medicare Other | Admitting: Physician Assistant

## 2017-08-14 VITALS — BP 154/86 | HR 77 | Wt 139.0 lb

## 2017-08-14 DIAGNOSIS — R609 Edema, unspecified: Secondary | ICD-10-CM

## 2017-08-14 DIAGNOSIS — R140 Abdominal distension (gaseous): Secondary | ICD-10-CM | POA: Diagnosis not present

## 2017-08-14 DIAGNOSIS — I340 Nonrheumatic mitral (valve) insufficiency: Secondary | ICD-10-CM | POA: Diagnosis not present
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
## 2017-08-14 NOTE — Patient Instructions (Signed)
-   Tall, tight socks / compression socks - Elevate legs above heart level - Limit sodium to less than 1500 mg/day  Peripheral Edema Peripheral edema is swelling that is caused by a buildup of fluid. Peripheral edema most often affects the lower legs, ankles, and feet. It can also develop in the arms, hands, and face. The area of the body that has peripheral edema will look swollen. It may also feel heavy or warm. Your clothes may start to feel tight. Pressing on the area may make a temporary dent in your skin. You may not be able to move your arm or leg as much as usual. There are many causes of peripheral edema. It can be a complication of other diseases, such as congestive heart failure, kidney disease, or a problem with your blood circulation. It also can be a side effect of certain medicines. It often happens to women during pregnancy. Sometimes, the cause is not known. Treating the underlying condition is often the only treatment for peripheral edema. Follow these instructions at home: Pay attention to any changes in your symptoms. Take these actions to help with your discomfort:  Raise (elevate) your legs while you are sitting or lying down.  Move around often to prevent stiffness and to lessen swelling. Do not sit or stand for long periods of time.  Wear support stockings as told by your health care provider.  Follow instructions from your health care provider about limiting salt (sodium) in your diet. Sometimes eating less salt can reduce swelling.  Take over-the-counter and prescription medicines only as told by your health care provider. Your health care provider may prescribe medicine to help your body get rid of excess water (diuretic).  Keep all follow-up visits as told by your health care provider. This is important.  Contact a health care provider if:  You have a fever.  Your edema starts suddenly or is getting worse, especially if you are pregnant or have a medical  condition.  You have swelling in only one leg.  You have increased swelling and pain in your legs. Get help right away if:  You develop shortness of breath, especially when you are lying down.  You have pain in your chest or abdomen.  You feel weak.  You faint. This information is not intended to replace advice given to you by your health care provider. Make sure you discuss any questions you have with your health care provider. Document Released: 12/29/2004 Document Revised: 04/25/2016 Document Reviewed: 06/03/2015 Elsevier Interactive Patient Education  [employer].

## 2017-08-15 ENCOUNTER — Ambulatory Visit: Payer: Medicare Other | Admitting: Family Medicine

## 2017-08-15 DIAGNOSIS — I340 Nonrheumatic mitral (valve) insufficiency: Secondary | ICD-10-CM | POA: Diagnosis not present

## 2017-08-15 DIAGNOSIS — R0602 Shortness of breath: Secondary | ICD-10-CM | POA: Diagnosis not present

## 2017-08-15 DIAGNOSIS — R609 Edema, unspecified: Secondary | ICD-10-CM | POA: Diagnosis not present

## 2017-08-15 DIAGNOSIS — R0789 Other chest pain: Secondary | ICD-10-CM | POA: Diagnosis not present

## 2017-08-15 DIAGNOSIS — R05 Cough: Secondary | ICD-10-CM | POA: Diagnosis not present

## 2017-08-16 ENCOUNTER — Ambulatory Visit (INDEPENDENT_AMBULATORY_CARE_PROVIDER_SITE_OTHER): Payer: Medicare Other

## 2017-08-16 ENCOUNTER — Encounter: Payer: Self-pay | Admitting: Physician Assistant

## 2017-08-16 DIAGNOSIS — R140 Abdominal distension (gaseous): Secondary | ICD-10-CM

## 2017-08-16 DIAGNOSIS — R7401 Elevation of levels of liver transaminase levels: Secondary | ICD-10-CM

## 2017-08-16 DIAGNOSIS — R740 Nonspecific elevation of levels of transaminase and lactic acid dehydrogenase [LDH]: Secondary | ICD-10-CM

## 2017-08-16 HISTORY — DX: Elevation of levels of liver transaminase levels: R74.01

## 2017-08-16 LAB — URINALYSIS, ROUTINE W REFLEX MICROSCOPIC
Bilirubin Urine: NEGATIVE
Glucose, UA: NEGATIVE
Hgb urine dipstick: NEGATIVE
Ketones, ur: NEGATIVE
Leukocytes, UA: NEGATIVE
Nitrite: NEGATIVE
Protein, ur: NEGATIVE
Specific Gravity, Urine: 1.004 (ref 1.001–1.03)
pH: 6.5 (ref 5.0–8.0)

## 2017-08-16 LAB — CBC WITH DIFFERENTIAL/PLATELET
Basophils Absolute: 37 cells/uL (ref 0–200)
Basophils Relative: 0.6 %
Eosinophils Absolute: 62 cells/uL (ref 15–500)
Eosinophils Relative: 1 %
HCT: 44.4 % (ref 35.0–45.0)
Hemoglobin: 14.9 g/dL (ref 11.7–15.5)
Lymphs Abs: 1866 cells/uL (ref 850–3900)
MCH: 29.2 pg (ref 27.0–33.0)
MCHC: 33.6 g/dL (ref 32.0–36.0)
MCV: 87.1 fL (ref 80.0–100.0)
MPV: 10.2 fL (ref 7.5–12.5)
Monocytes Relative: 10 %
Neutro Abs: 3615 cells/uL (ref 1500–7800)
Neutrophils Relative %: 58.3 %
Platelets: 273 10*3/uL (ref 140–400)
RBC: 5.1 10*6/uL (ref 3.80–5.10)
RDW: 12.1 % (ref 11.0–15.0)
Total Lymphocyte: 30.1 %
WBC mixed population: 620 cells/uL (ref 200–950)
WBC: 6.2 10*3/uL (ref 3.8–10.8)

## 2017-08-16 LAB — COMPREHENSIVE METABOLIC PANEL
AG Ratio: 2.1 (calc) (ref 1.0–2.5)
ALT: 36 U/L — ABNORMAL HIGH (ref 6–29)
AST: 29 U/L (ref 10–35)
Albumin: 4.9 g/dL (ref 3.6–5.1)
Alkaline phosphatase (APISO): 82 U/L (ref 33–130)
BUN/Creatinine Ratio: 7 (calc) (ref 6–22)
BUN: 5 mg/dL — ABNORMAL LOW (ref 7–25)
CO2: 26 mmol/L (ref 20–32)
Calcium: 9.8 mg/dL (ref 8.6–10.4)
Chloride: 102 mmol/L (ref 98–110)
Creat: 0.67 mg/dL (ref 0.50–0.99)
Globulin: 2.3 g/dL (calc) (ref 1.9–3.7)
Glucose, Bld: 78 mg/dL (ref 65–99)
Potassium: 4.2 mmol/L (ref 3.5–5.3)
Sodium: 139 mmol/L (ref 135–146)
Total Bilirubin: 0.6 mg/dL (ref 0.2–1.2)
Total Protein: 7.2 g/dL (ref 6.1–8.1)

## 2017-08-16 LAB — BRAIN NATRIURETIC PEPTIDE: Brain Natriuretic Peptide: 17 pg/mL (ref <100)

## 2017-08-16 NOTE — Progress Notes (Signed)
Labs are all within normal limits except for mild elevation in ALT, which is a liver enzyme.  Recommend follow-up with PCP to recheck liver function in 1 month

## 2017-08-17 ENCOUNTER — Encounter: Payer: Self-pay | Admitting: Physician Assistant

## 2017-08-17 DIAGNOSIS — R600 Localized edema: Secondary | ICD-10-CM | POA: Insufficient documentation

## 2017-08-17 DIAGNOSIS — R609 Edema, unspecified: Secondary | ICD-10-CM | POA: Insufficient documentation

## 2017-08-17 DIAGNOSIS — R140 Abdominal distension (gaseous): Secondary | ICD-10-CM | POA: Insufficient documentation

## 2017-08-17 DIAGNOSIS — I340 Nonrheumatic mitral (valve) insufficiency: Secondary | ICD-10-CM | POA: Insufficient documentation

## 2017-08-17 NOTE — Progress Notes (Signed)
Good morning,  Your abdominal ultrasound was normal. It did show a large amount of gas. This is likely the gas of the abdominal bloating/distention. Recommend avoidance of gas-producing foods (eg, beans, onions, celery, carrots, raisins, bananas, apricots, prunes, Brussels sprouts, wheat germ, pretzels, and bagels). Keep a food diary to track foods that worsen symptoms. Follow-up with Dr. Madilyn Fireman in 2-4 weeks.  Best, Evlyn Clines

## 2017-08-21 DIAGNOSIS — M6281 Muscle weakness (generalized): Secondary | ICD-10-CM | POA: Diagnosis not present

## 2017-08-21 DIAGNOSIS — M545 Low back pain: Secondary | ICD-10-CM | POA: Diagnosis not present

## 2017-08-21 DIAGNOSIS — M542 Cervicalgia: Secondary | ICD-10-CM | POA: Diagnosis not present

## 2017-08-24 ENCOUNTER — Other Ambulatory Visit: Payer: Self-pay | Admitting: Family Medicine

## 2017-08-25 DIAGNOSIS — K581 Irritable bowel syndrome with constipation: Secondary | ICD-10-CM | POA: Diagnosis not present

## 2017-08-25 DIAGNOSIS — R1084 Generalized abdominal pain: Secondary | ICD-10-CM | POA: Diagnosis not present

## 2017-08-25 DIAGNOSIS — K219 Gastro-esophageal reflux disease without esophagitis: Secondary | ICD-10-CM | POA: Diagnosis not present

## 2017-08-28 DIAGNOSIS — M6281 Muscle weakness (generalized): Secondary | ICD-10-CM | POA: Diagnosis not present

## 2017-08-28 DIAGNOSIS — M545 Low back pain: Secondary | ICD-10-CM | POA: Diagnosis not present

## 2017-08-28 DIAGNOSIS — M542 Cervicalgia: Secondary | ICD-10-CM | POA: Diagnosis not present

## 2017-08-30 ENCOUNTER — Ambulatory Visit (HOSPITAL_BASED_OUTPATIENT_CLINIC_OR_DEPARTMENT_OTHER)
Admission: RE | Admit: 2017-08-30 | Discharge: 2017-08-30 | Disposition: A | Discharge status: Home / Self Care | Payer: Medicare Other | Source: Ambulatory Visit | Attending: Physician Assistant | Admitting: Physician Assistant

## 2017-08-30 DIAGNOSIS — R609 Edema, unspecified: Secondary | ICD-10-CM | POA: Diagnosis not present

## 2017-08-30 DIAGNOSIS — I340 Nonrheumatic mitral (valve) insufficiency: Secondary | ICD-10-CM

## 2017-08-30 LAB — ECHOCARDIOGRAM COMPLETE
E decel time: 289 msec
E/e' ratio: 9.15
FS: 28 % (ref 28–44)
IVS/LV PW RATIO, ED: 0.77
LA ID, A-P, ES: 34 mm
LA diam end sys: 34 mm
LA diam index: 1.99 cm/m2
LA vol A4C: 32.1 ml
LA vol index: 20.3 mL/m2
LA vol: 34.7 mL
LV E/e' medial: 9.15
LV E/e'average: 9.15
LV PW d: 11.9 mm — AB (ref 0.6–1.1)
LV e' LATERAL: 11.7 cm/s
LVOT SV: 67 mL
LVOT VTI: 26.5 cm
LVOT area: 2.54 cm2
LVOT diameter: 18 mm
LVOT peak grad rest: 6 mmHg
LVOT peak vel: 127 cm/s
Lateral S' vel: 7.62 cm/s
MV Dec: 289
MV Peak grad: 5 mmHg
MV pk A vel: 94.1 m/s
MV pk E vel: 107 m/s
RV sys press: 24 mmHg
Reg peak vel: 231 cm/s
TAPSE: 20.7 mm
TDI e' lateral: 11.7
TDI e' medial: 7.07
TR max vel: 231 cm/s

## 2017-08-30 NOTE — Progress Notes (Signed)
  Echocardiogram 2D Echocardiogram has been performed.  Breanna Casey 08/30/2017, 4:19 PM

## 2017-08-31 NOTE — Progress Notes (Signed)
Good morning Breanna Casey,  Your echocardiogram is unchanged from your prior one. The heart is still pumping well (normal ejection fraction) and you still have mild mitral regurgitation.  Recommend repeating an echo in 1-3 years, whichever your PCP decides.  Best, Evlyn Clines

## 2017-09-04 DIAGNOSIS — M6281 Muscle weakness (generalized): Secondary | ICD-10-CM | POA: Diagnosis not present

## 2017-09-04 DIAGNOSIS — M542 Cervicalgia: Secondary | ICD-10-CM | POA: Diagnosis not present

## 2017-09-04 DIAGNOSIS — M545 Low back pain: Secondary | ICD-10-CM | POA: Diagnosis not present

## 2017-09-07 ENCOUNTER — Ambulatory Visit (INDEPENDENT_AMBULATORY_CARE_PROVIDER_SITE_OTHER): Payer: Medicare Other | Admitting: Family Medicine

## 2017-09-07 ENCOUNTER — Encounter: Payer: Self-pay | Admitting: Family Medicine

## 2017-09-07 VITALS — BP 175/80 | HR 90 | Ht 66.0 in | Wt 139.0 lb

## 2017-09-07 DIAGNOSIS — I878 Other specified disorders of veins: Secondary | ICD-10-CM | POA: Diagnosis not present

## 2017-09-07 DIAGNOSIS — K581 Irritable bowel syndrome with constipation: Secondary | ICD-10-CM | POA: Insufficient documentation

## 2017-09-07 DIAGNOSIS — R600 Localized edema: Secondary | ICD-10-CM

## 2017-09-07 MED ORDER — SPIRONOLACTONE 50 MG PO TABS: 50.0000 mg | ORAL_TABLET | Freq: Every day | ORAL | 1 refills | Status: DC

## 2017-09-07 NOTE — Patient Instructions (Signed)
Go to have a lab drawn to check her potassium in one week.
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
## 2017-09-11 DIAGNOSIS — M545 Low back pain: Secondary | ICD-10-CM | POA: Diagnosis not present

## 2017-09-11 DIAGNOSIS — M542 Cervicalgia: Secondary | ICD-10-CM | POA: Diagnosis not present

## 2017-09-11 DIAGNOSIS — M6281 Muscle weakness (generalized): Secondary | ICD-10-CM | POA: Diagnosis not present

## 2017-09-13 DIAGNOSIS — H35363 Drusen (degenerative) of macula, bilateral: Secondary | ICD-10-CM | POA: Diagnosis not present

## 2017-09-13 DIAGNOSIS — H43811 Vitreous degeneration, right eye: Secondary | ICD-10-CM | POA: Diagnosis not present

## 2017-09-13 DIAGNOSIS — H31092 Other chorioretinal scars, left eye: Secondary | ICD-10-CM | POA: Diagnosis not present

## 2017-09-14 ENCOUNTER — Other Ambulatory Visit: Payer: Self-pay | Admitting: Family Medicine

## 2017-09-18 DIAGNOSIS — M545 Low back pain: Secondary | ICD-10-CM | POA: Diagnosis not present

## 2017-09-18 DIAGNOSIS — M6281 Muscle weakness (generalized): Secondary | ICD-10-CM | POA: Diagnosis not present

## 2017-09-18 DIAGNOSIS — M542 Cervicalgia: Secondary | ICD-10-CM | POA: Diagnosis not present

## 2017-09-19 ENCOUNTER — Ambulatory Visit (INDEPENDENT_AMBULATORY_CARE_PROVIDER_SITE_OTHER): Payer: Medicare Other | Admitting: Family Medicine

## 2017-09-19 ENCOUNTER — Encounter: Payer: Self-pay | Admitting: Family Medicine

## 2017-09-19 ENCOUNTER — Telehealth: Payer: Self-pay | Admitting: Family Medicine

## 2017-09-19 VITALS — BP 148/58 | HR 76 | Ht 66.0 in | Wt 135.0 lb

## 2017-09-19 DIAGNOSIS — R600 Localized edema: Secondary | ICD-10-CM

## 2017-09-19 DIAGNOSIS — Z91018 Allergy to other foods: Secondary | ICD-10-CM

## 2017-09-19 DIAGNOSIS — I878 Other specified disorders of veins: Secondary | ICD-10-CM | POA: Diagnosis not present

## 2017-09-19 DIAGNOSIS — Z0000 Encounter for general adult medical examination without abnormal findings: Secondary | ICD-10-CM

## 2017-09-19 DIAGNOSIS — K3184 Gastroparesis: Secondary | ICD-10-CM

## 2017-09-19 DIAGNOSIS — R7309 Other abnormal glucose: Secondary | ICD-10-CM | POA: Diagnosis not present

## 2017-09-19 NOTE — Telephone Encounter (Signed)
Please call patient and let her know that I did check to see if there was an interaction between the Flovent and the spironolactone. On the software that I have the cross reference his medications it did not come up as any interaction. Says she should be safe to use both.

## 2017-09-19 NOTE — Patient Instructions (Addendum)
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
## 2017-09-20 LAB — COMPLETE METABOLIC PANEL WITH GFR
AG Ratio: 1.8 (calc) (ref 1.0–2.5)
ALT: 17 U/L (ref 6–29)
AST: 22 U/L (ref 10–35)
Albumin: 4.8 g/dL (ref 3.6–5.1)
Alkaline phosphatase (APISO): 75 U/L (ref 33–130)
BUN/Creatinine Ratio: 8 (calc) (ref 6–22)
BUN: 6 mg/dL — ABNORMAL LOW (ref 7–25)
CO2: 23 mmol/L (ref 20–32)
Calcium: 10.1 mg/dL (ref 8.6–10.4)
Chloride: 105 mmol/L (ref 98–110)
Creat: 0.77 mg/dL (ref 0.50–0.99)
GFR, Est African American: 97 mL/min/{1.73_m2} (ref >=60)
GFR, Est Non African American: 83 mL/min/{1.73_m2} (ref >=60)
Globulin: 2.6 g/dL (calc) (ref 1.9–3.7)
Glucose, Bld: 83 mg/dL (ref 65–99)
Potassium: 4.1 mmol/L (ref 3.5–5.3)
Sodium: 139 mmol/L (ref 135–146)
Total Bilirubin: 0.7 mg/dL (ref 0.2–1.2)
Total Protein: 7.4 g/dL (ref 6.1–8.1)

## 2017-09-20 LAB — HEMOGLOBIN A1C
Hgb A1c MFr Bld: 4.9 % of total Hgb (ref <5.7)
Mean Plasma Glucose: 94 (calc)
eAG (mmol/L): 5.2 (calc)

## 2017-09-20 NOTE — Telephone Encounter (Signed)
Left message for Pt with recommendation, callback provided for any questions.

## 2017-09-25 DIAGNOSIS — M542 Cervicalgia: Secondary | ICD-10-CM | POA: Diagnosis not present

## 2017-09-25 DIAGNOSIS — M6281 Muscle weakness (generalized): Secondary | ICD-10-CM | POA: Diagnosis not present

## 2017-09-28 ENCOUNTER — Encounter: Payer: Self-pay | Admitting: Family Medicine

## 2017-09-28 MED ORDER — TRAMADOL HCL 50 MG PO TABS: 50.0000 mg | ORAL_TABLET | Freq: Four times a day (QID) | ORAL | 3 refills | Status: DC | PRN

## 2017-09-28 MED ORDER — FLUTICASONE PROPIONATE HFA 110 MCG/ACT IN AERO: INHALATION_SPRAY | RESPIRATORY_TRACT | 3 refills | Status: DC

## 2017-10-02 DIAGNOSIS — M545 Low back pain: Secondary | ICD-10-CM | POA: Diagnosis not present

## 2017-10-02 DIAGNOSIS — M6281 Muscle weakness (generalized): Secondary | ICD-10-CM | POA: Diagnosis not present

## 2017-10-02 DIAGNOSIS — M542 Cervicalgia: Secondary | ICD-10-CM | POA: Diagnosis not present

## 2017-10-03 ENCOUNTER — Encounter: Payer: Self-pay | Admitting: Family Medicine

## 2017-10-04 NOTE — Telephone Encounter (Signed)
Shortness of breath code added.

## 2017-10-04 NOTE — Telephone Encounter (Signed)
OK, see if we can re-submit under Shorts of breath or dyspnea.

## 2017-10-09 DIAGNOSIS — M545 Low back pain: Secondary | ICD-10-CM | POA: Diagnosis not present

## 2017-10-09 DIAGNOSIS — D485 Neoplasm of uncertain behavior of skin: Secondary | ICD-10-CM | POA: Diagnosis not present

## 2017-10-09 DIAGNOSIS — M6281 Muscle weakness (generalized): Secondary | ICD-10-CM | POA: Diagnosis not present

## 2017-10-09 DIAGNOSIS — D225 Melanocytic nevi of trunk: Secondary | ICD-10-CM | POA: Diagnosis not present

## 2017-10-09 DIAGNOSIS — M542 Cervicalgia: Secondary | ICD-10-CM | POA: Diagnosis not present

## 2017-10-16 DIAGNOSIS — M542 Cervicalgia: Secondary | ICD-10-CM | POA: Diagnosis not present

## 2017-10-16 DIAGNOSIS — M6281 Muscle weakness (generalized): Secondary | ICD-10-CM | POA: Diagnosis not present

## 2017-10-16 DIAGNOSIS — M545 Low back pain: Secondary | ICD-10-CM | POA: Diagnosis not present

## 2017-10-30 DIAGNOSIS — M542 Cervicalgia: Secondary | ICD-10-CM | POA: Diagnosis not present

## 2017-10-30 DIAGNOSIS — M6281 Muscle weakness (generalized): Secondary | ICD-10-CM | POA: Diagnosis not present

## 2017-11-06 DIAGNOSIS — M545 Low back pain: Secondary | ICD-10-CM | POA: Diagnosis not present

## 2017-11-06 DIAGNOSIS — M542 Cervicalgia: Secondary | ICD-10-CM | POA: Diagnosis not present

## 2017-11-06 DIAGNOSIS — M6281 Muscle weakness (generalized): Secondary | ICD-10-CM | POA: Diagnosis not present

## 2017-11-15 DIAGNOSIS — M545 Low back pain: Secondary | ICD-10-CM | POA: Diagnosis not present

## 2017-11-15 DIAGNOSIS — M6281 Muscle weakness (generalized): Secondary | ICD-10-CM | POA: Diagnosis not present

## 2017-11-15 DIAGNOSIS — M542 Cervicalgia: Secondary | ICD-10-CM | POA: Diagnosis not present

## 2017-11-17 ENCOUNTER — Encounter: Payer: Self-pay | Admitting: Family Medicine

## 2017-11-20 DIAGNOSIS — M545 Low back pain: Secondary | ICD-10-CM | POA: Diagnosis not present

## 2017-11-20 DIAGNOSIS — M6281 Muscle weakness (generalized): Secondary | ICD-10-CM | POA: Diagnosis not present

## 2017-11-20 DIAGNOSIS — M542 Cervicalgia: Secondary | ICD-10-CM | POA: Diagnosis not present

## 2017-11-23 DIAGNOSIS — M6281 Muscle weakness (generalized): Secondary | ICD-10-CM | POA: Diagnosis not present

## 2017-11-23 DIAGNOSIS — M545 Low back pain: Secondary | ICD-10-CM | POA: Diagnosis not present

## 2017-11-23 DIAGNOSIS — M542 Cervicalgia: Secondary | ICD-10-CM | POA: Diagnosis not present

## 2017-11-30 ENCOUNTER — Ambulatory Visit: Payer: Medicare Other | Admitting: Family Medicine

## 2017-11-30 DIAGNOSIS — M542 Cervicalgia: Secondary | ICD-10-CM | POA: Diagnosis not present

## 2017-11-30 DIAGNOSIS — M6281 Muscle weakness (generalized): Secondary | ICD-10-CM | POA: Diagnosis not present

## 2017-11-30 DIAGNOSIS — M545 Low back pain: Secondary | ICD-10-CM | POA: Diagnosis not present

## 2017-12-01 ENCOUNTER — Ambulatory Visit: Payer: Medicare Other | Admitting: Family Medicine

## 2017-12-04 IMAGING — US US ABDOMEN COMPLETE
1 series · 14 of 25 positions shown · non-contrast
Comparison: None available.

CLINICAL DATA: Abdominal wall bloating for to 3 months. Abdominal
distention.

EXAM:
ABDOMEN ULTRASOUND COMPLETE

[Series 1: us abdomen complete · 0.18mm/px · 14 of 67 slices shown]
[im 1/67]
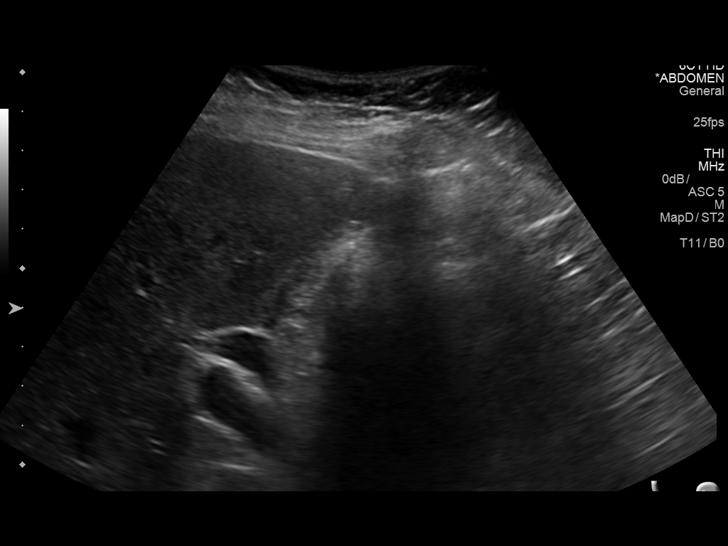
[im 6/67]
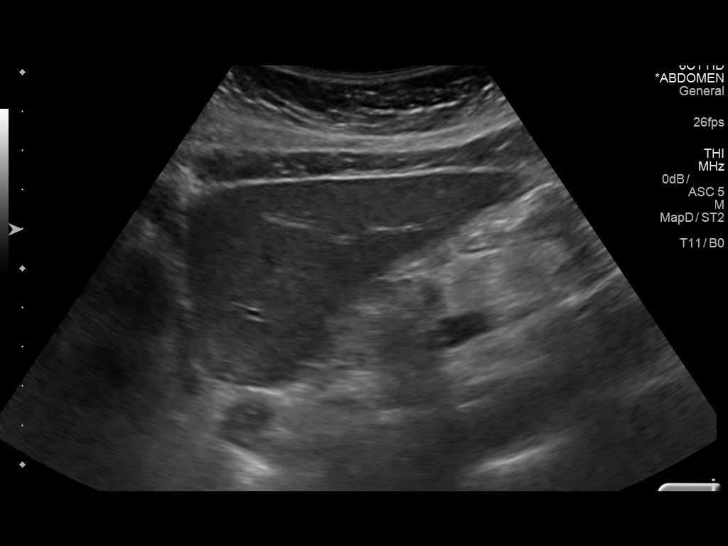
[im 12/67]
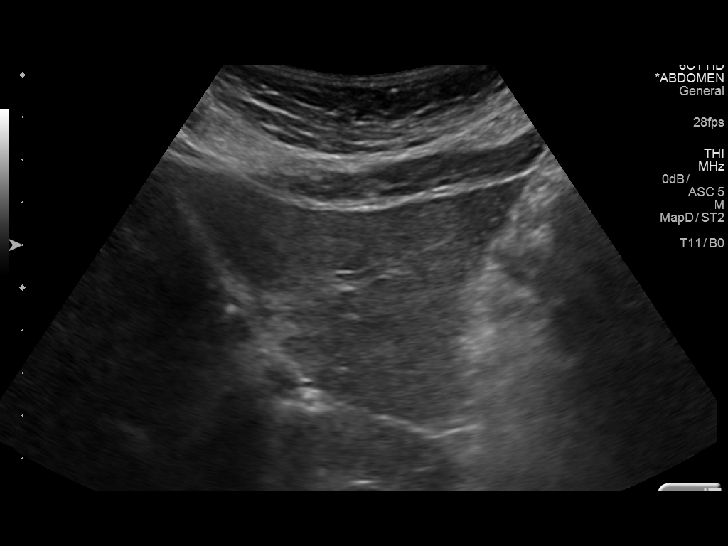
[im 17/67]
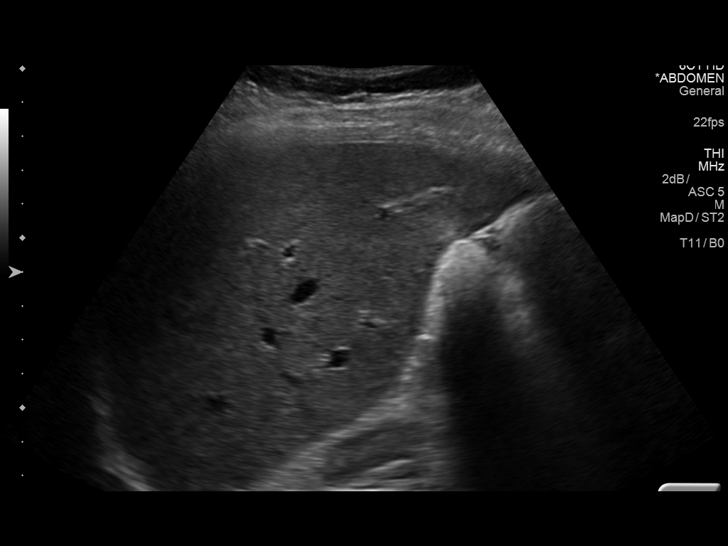
[im 23/67]
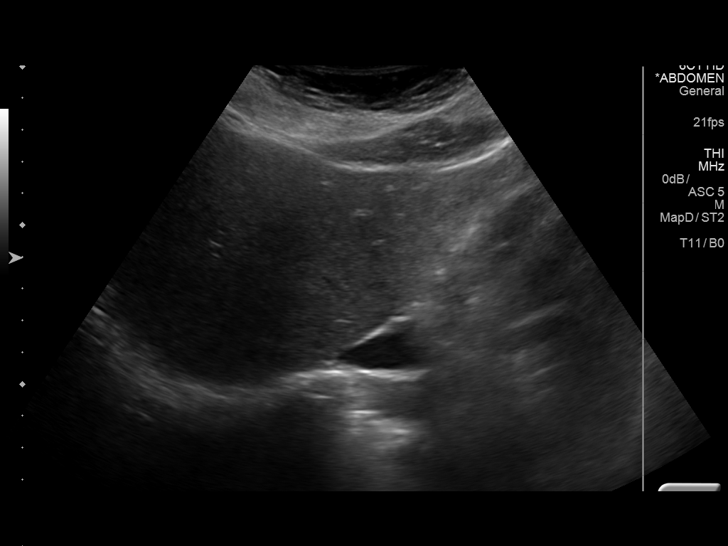
[im 25/67]
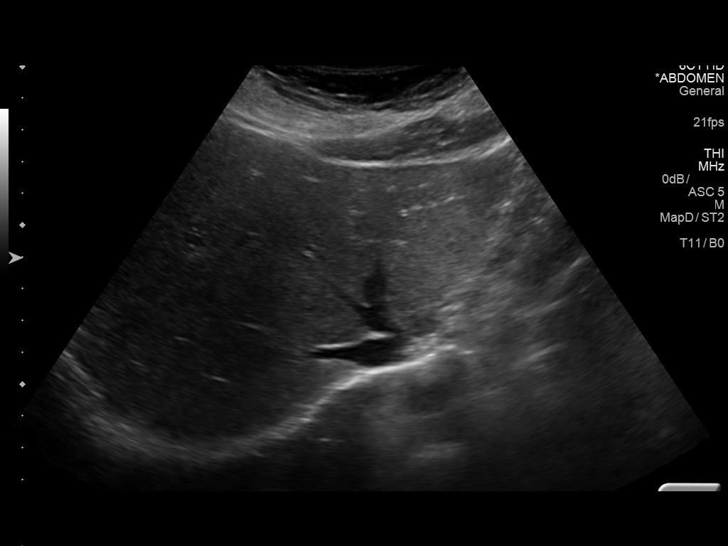
[im 31/67]
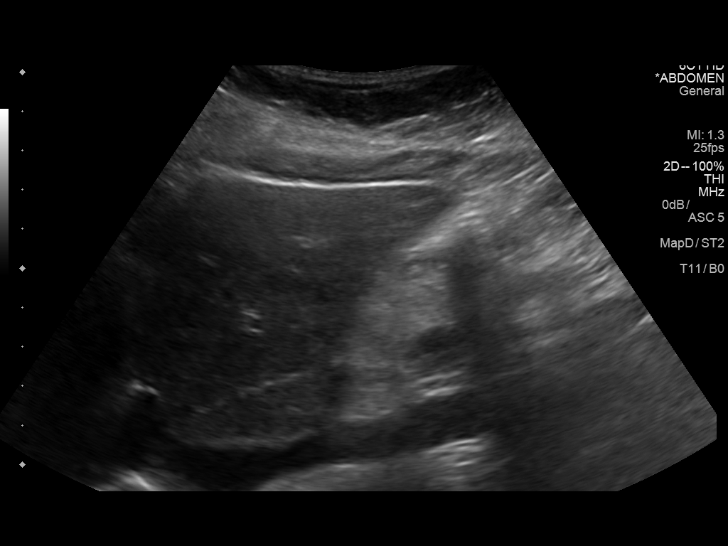
[im 36/67]
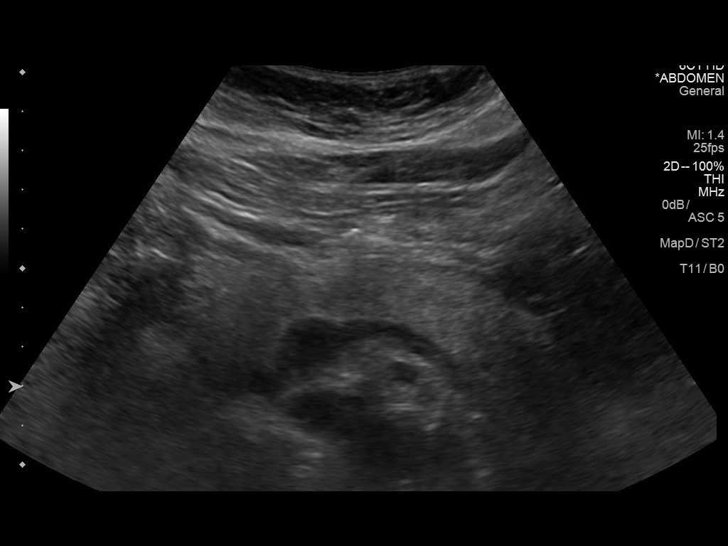
[im 42/67]
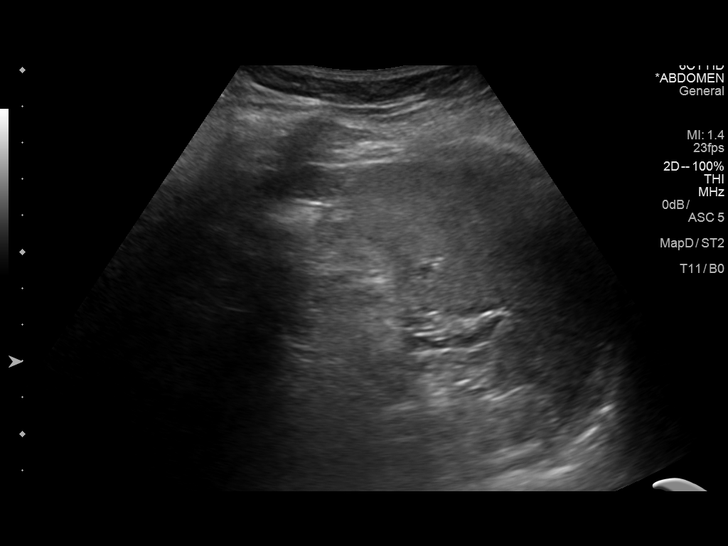
[im 45/67]
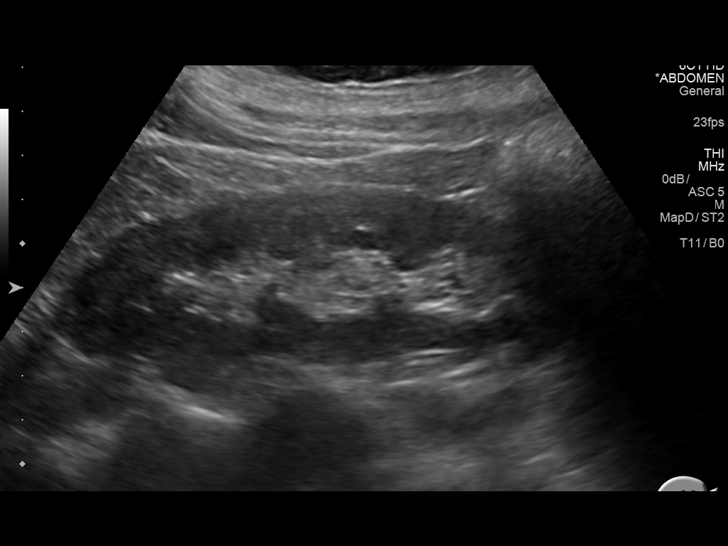
[im 50/67]
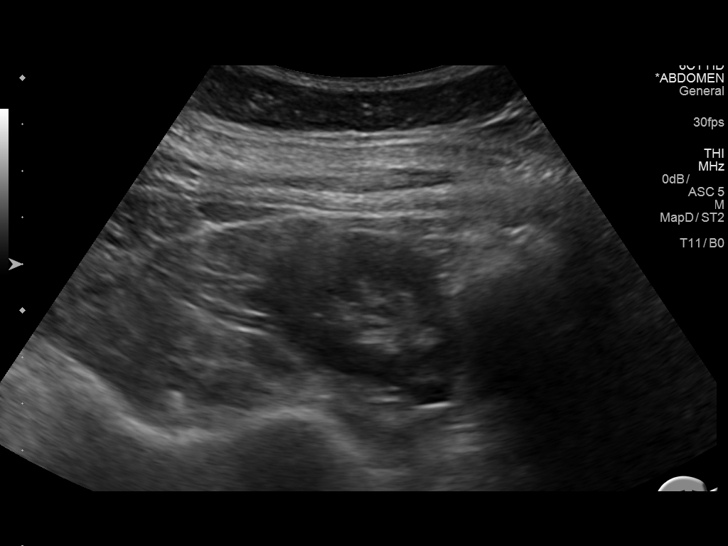
[im 56/67]
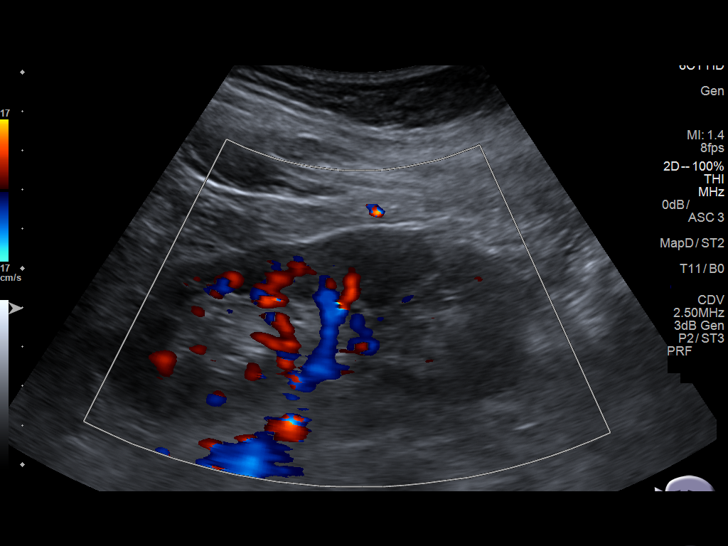
[im 61/67]
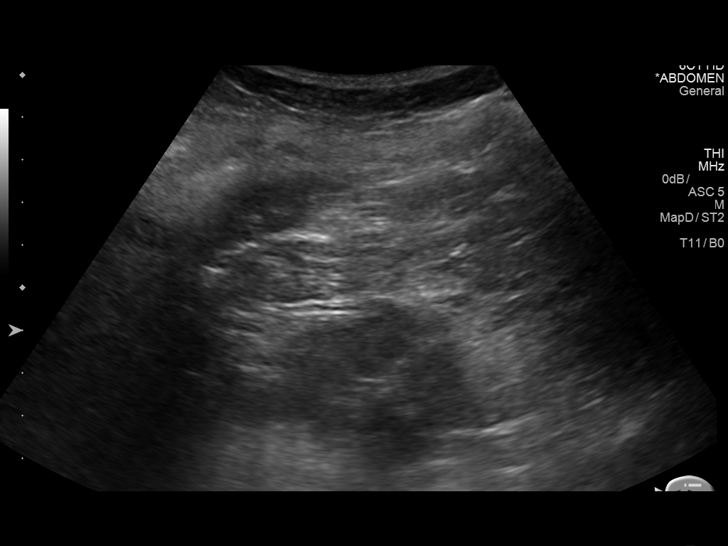
[im 67/67]
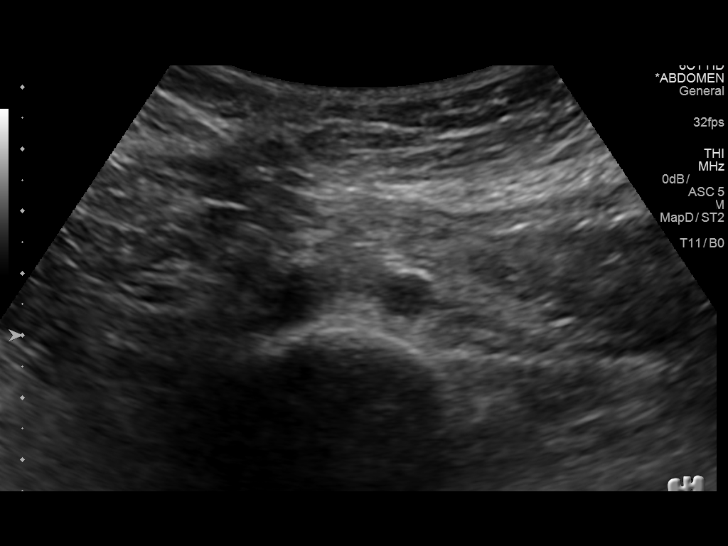

[14 of 25 positions shown; findings below may reference images not displayed]

FINDINGS: Gallbladder: The gallbladder is surgically absent

Common bile duct: Diameter: 6.0 mm, within normal limits following
cholecystectomy.

Liver: No focal lesion identified. Within normal limits in
parenchymal echogenicity. Portal vein is patent on color Doppler
imaging with normal direction of blood flow towards the liver.

IVC: No abnormality visualized.

Pancreas: Visualized portion unremarkable.

Spleen: Size and appearance within normal limits.

Right Kidney: Length: 11.8 cm, within normal limits. Echogenicity
within normal limits. No mass or hydronephrosis visualized.

Left Kidney: Length: 11.4 cm, within normal limits. Echogenicity
within normal limits. No mass or hydronephrosis visualized.

Abdominal aorta: No aneurysm visualized. The distal aorta is poorly
visualized due to overlying bowel gas.

Other findings: None.
IMPRESSION: No acute or focal abnormality to explain the patient's symptoms.

The study is mildly limited by bowel gas.

## 2017-12-07 ENCOUNTER — Encounter: Payer: Self-pay | Admitting: Family Medicine

## 2017-12-20 ENCOUNTER — Encounter: Payer: Self-pay | Admitting: Family Medicine

## 2017-12-22 NOTE — Telephone Encounter (Signed)
He should have 2 remaining refills.  They were written in October so they should not expire for 6 months.  Thus, we need to call the pharmacy and verify why they are not refilling her last 2 refills.

## 2017-12-25 DIAGNOSIS — H2513 Age-related nuclear cataract, bilateral: Secondary | ICD-10-CM | POA: Diagnosis not present

## 2017-12-25 DIAGNOSIS — H43813 Vitreous degeneration, bilateral: Secondary | ICD-10-CM | POA: Diagnosis not present

## 2017-12-25 DIAGNOSIS — H31002 Unspecified chorioretinal scars, left eye: Secondary | ICD-10-CM | POA: Diagnosis not present

## 2017-12-25 NOTE — Telephone Encounter (Signed)
Spoke w/Natasha and she stated that pt has p/u medication on 12/22/17 #120.Audelia Hives [place]

## 2017-12-26 ENCOUNTER — Encounter: Payer: Self-pay | Admitting: Family Medicine

## 2017-12-26 ENCOUNTER — Ambulatory Visit (INDEPENDENT_AMBULATORY_CARE_PROVIDER_SITE_OTHER): Payer: Medicare HMO | Admitting: Family Medicine

## 2017-12-26 VITALS — BP 172/83 | HR 76 | Ht 66.0 in | Wt 137.0 lb

## 2017-12-26 DIAGNOSIS — M797 Fibromyalgia: Secondary | ICD-10-CM | POA: Diagnosis not present

## 2017-12-26 DIAGNOSIS — H269 Unspecified cataract: Secondary | ICD-10-CM | POA: Diagnosis not present

## 2017-12-26 DIAGNOSIS — R000 Tachycardia, unspecified: Secondary | ICD-10-CM | POA: Insufficient documentation

## 2017-12-26 DIAGNOSIS — M545 Low back pain, unspecified: Secondary | ICD-10-CM
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
## 2017-12-27 ENCOUNTER — Encounter: Payer: Self-pay | Admitting: Family Medicine

## 2018-01-02 ENCOUNTER — Encounter: Payer: Self-pay | Admitting: Family Medicine

## 2018-01-02 ENCOUNTER — Ambulatory Visit (INDEPENDENT_AMBULATORY_CARE_PROVIDER_SITE_OTHER): Payer: Medicare HMO | Admitting: Family Medicine

## 2018-01-02 VITALS — BP 138/76 | HR 72 | Ht 66.0 in | Wt 135.0 lb

## 2018-01-02 DIAGNOSIS — M7741 Metatarsalgia, right foot: Secondary | ICD-10-CM

## 2018-01-02 NOTE — Patient Instructions (Signed)
If symptoms are not improving over the next couple of weeks then please let us know and I will get you in with Podiatry
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
## 2018-01-03 ENCOUNTER — Telehealth: Payer: Self-pay | Admitting: *Deleted

## 2018-01-03 ENCOUNTER — Encounter: Payer: Self-pay | Admitting: Family Medicine

## 2018-01-03 NOTE — Telephone Encounter (Signed)
Form complete

## 2018-01-03 NOTE — Telephone Encounter (Signed)
Will notify pt via mychart that her forms have been sent and that she can p/u originals at front desk.Elouise Munroe, [place]

## 2018-01-03 NOTE — Telephone Encounter (Signed)
Disability forms completed, copied,scanned,faxed and confirmation received.Maryruth Eve, [place] Crocker, CMA

## 2018-01-05 ENCOUNTER — Other Ambulatory Visit: Payer: Self-pay | Admitting: Family Medicine

## 2018-01-05 DIAGNOSIS — Z91018 Allergy to other foods: Secondary | ICD-10-CM

## 2018-01-09 ENCOUNTER — Encounter: Payer: Self-pay | Admitting: Family Medicine

## 2018-01-10 DIAGNOSIS — Z85828 Personal history of other malignant neoplasm of skin: Secondary | ICD-10-CM | POA: Diagnosis not present

## 2018-01-10 DIAGNOSIS — Z08 Encounter for follow-up examination after completed treatment for malignant neoplasm: Secondary | ICD-10-CM | POA: Diagnosis not present

## 2018-01-10 DIAGNOSIS — D225 Melanocytic nevi of trunk: Secondary | ICD-10-CM | POA: Diagnosis not present

## 2018-01-16 ENCOUNTER — Ambulatory Visit (INDEPENDENT_AMBULATORY_CARE_PROVIDER_SITE_OTHER): Payer: Medicare HMO | Admitting: Physician Assistant

## 2018-01-16 ENCOUNTER — Encounter: Payer: Self-pay | Admitting: Physician Assistant

## 2018-01-16 ENCOUNTER — Ambulatory Visit (INDEPENDENT_AMBULATORY_CARE_PROVIDER_SITE_OTHER): Payer: Medicare HMO

## 2018-01-16 VITALS — BP 141/79 | HR 88 | Temp 98.8°F | Ht 66.0 in | Wt 136.0 lb

## 2018-01-16 DIAGNOSIS — R05 Cough: Secondary | ICD-10-CM

## 2018-01-16 DIAGNOSIS — J069 Acute upper respiratory infection, unspecified: Secondary | ICD-10-CM

## 2018-01-16 DIAGNOSIS — R059 Cough, unspecified: Secondary | ICD-10-CM
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
## 2018-01-18 ENCOUNTER — Encounter: Payer: Self-pay | Admitting: Physician Assistant

## 2018-01-19 MED ORDER — LEVOFLOXACIN 500 MG PO TABS: 500.0000 mg | ORAL_TABLET | Freq: Every day | ORAL | 0 refills | Status: AC

## 2018-01-19 NOTE — Addendum Note (Signed)
Addended by: Beatrice Lecher D on: 01/19/2018 10:01 AM   Modules accepted: Orders

## 2018-01-25 ENCOUNTER — Telehealth: Payer: Self-pay | Admitting: Family Medicine

## 2018-01-25 ENCOUNTER — Encounter: Payer: Self-pay | Admitting: Family Medicine

## 2018-01-25 DIAGNOSIS — M7741 Metatarsalgia, right foot: Secondary | ICD-10-CM

## 2018-01-25 NOTE — Telephone Encounter (Signed)
Will route to PCP for sign off.

## 2018-01-25 NOTE — Telephone Encounter (Addendum)
Referral placed for downstairs,routed pcp for review and signature .Breanna Casey, [place]

## 2018-01-25 NOTE — Telephone Encounter (Signed)
Pt called. She wants to go ahead with getting referral to Podiatrist.

## 2018-02-01 ENCOUNTER — Encounter: Payer: Self-pay | Admitting: Family Medicine

## 2018-02-01 ENCOUNTER — Ambulatory Visit (INDEPENDENT_AMBULATORY_CARE_PROVIDER_SITE_OTHER): Payer: Medicare HMO | Admitting: Family Medicine

## 2018-02-01 VITALS — BP 142/70 | HR 73 | Ht 66.0 in | Wt 133.0 lb

## 2018-02-01 DIAGNOSIS — M797 Fibromyalgia: Secondary | ICD-10-CM | POA: Diagnosis not present

## 2018-02-01 DIAGNOSIS — M62838 Other muscle spasm: Secondary | ICD-10-CM | POA: Diagnosis not present

## 2018-02-01 DIAGNOSIS — R062 Wheezing: Secondary | ICD-10-CM | POA: Diagnosis not present

## 2018-02-01 DIAGNOSIS — G43109 Migraine with aura, not intractable, without status migrainosus: Secondary | ICD-10-CM | POA: Diagnosis not present

## 2018-02-01 DIAGNOSIS — R519 Headache, unspecified: Secondary | ICD-10-CM

## 2018-02-01 DIAGNOSIS — R51 Headache: Secondary | ICD-10-CM
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
## 2018-02-02 ENCOUNTER — Telehealth: Payer: Self-pay | Admitting: Family Medicine

## 2018-02-02 DIAGNOSIS — S161XXA Strain of muscle, fascia and tendon at neck level, initial encounter: Secondary | ICD-10-CM

## 2018-02-02 NOTE — Telephone Encounter (Signed)
Pt called and would like to go ahead and get a referral for the [facility] that was discussed in her last visit. Thanks

## 2018-02-02 NOTE — Telephone Encounter (Signed)
Order placed

## 2018-02-06 DIAGNOSIS — H04123 Dry eye syndrome of bilateral lacrimal glands: Secondary | ICD-10-CM | POA: Diagnosis not present

## 2018-02-06 DIAGNOSIS — H2513 Age-related nuclear cataract, bilateral: Secondary | ICD-10-CM | POA: Diagnosis not present

## 2018-02-06 DIAGNOSIS — H2512 Age-related nuclear cataract, left eye: Secondary | ICD-10-CM | POA: Diagnosis not present

## 2018-02-12 ENCOUNTER — Encounter: Payer: Self-pay | Admitting: Podiatry

## 2018-02-12 ENCOUNTER — Ambulatory Visit: Payer: Medicare HMO | Admitting: Podiatry

## 2018-02-12 ENCOUNTER — Ambulatory Visit (INDEPENDENT_AMBULATORY_CARE_PROVIDER_SITE_OTHER): Payer: Medicare HMO

## 2018-02-12 VITALS — BP 172/82 | HR 82 | Ht 66.0 in | Wt 133.0 lb

## 2018-02-12 DIAGNOSIS — M7742 Metatarsalgia, left foot: Secondary | ICD-10-CM | POA: Diagnosis not present

## 2018-02-12 DIAGNOSIS — M7741 Metatarsalgia, right foot: Secondary | ICD-10-CM | POA: Diagnosis not present

## 2018-02-12 DIAGNOSIS — M216X1 Other acquired deformities of right foot: Secondary | ICD-10-CM

## 2018-02-12 DIAGNOSIS — M216X2 Other acquired deformities of left foot: Secondary | ICD-10-CM | POA: Diagnosis not present

## 2018-02-12 DIAGNOSIS — M79671 Pain in right foot: Secondary | ICD-10-CM | POA: Diagnosis not present

## 2018-02-12 DIAGNOSIS — M19072 Primary osteoarthritis, left ankle and foot: Secondary | ICD-10-CM | POA: Diagnosis not present

## 2018-02-12 DIAGNOSIS — M21969 Unspecified acquired deformity of unspecified lower leg: Secondary | ICD-10-CM | POA: Diagnosis not present

## 2018-02-12 DIAGNOSIS — M79672 Pain in left foot: Secondary | ICD-10-CM | POA: Diagnosis not present

## 2018-02-12 DIAGNOSIS — M19071 Primary osteoarthritis, right ankle and foot: Secondary | ICD-10-CM | POA: Diagnosis not present

## 2018-02-12 NOTE — Patient Instructions (Signed)
Seen for bilateral foot pain. Noted of weak first metatarsal bone with weight shifting to lateral column. Positive of mild plantar callus under 2nd metatarsal. Will do weight bearing x-ray and discuss next visit. Return as done x-ray exam.

## 2018-02-12 NOTE — Progress Notes (Signed)
SUBJECTIVE: 62 y.o. year old female presents complaining of pain in right 2nd toe, some in left also duration of 3-4 month. Patient was treated using metatarsal pad under the ball of foot, which helped for 2 days on left and not at all on right. Not on feet much. Patient is referred by Dr. Madilyn Fireman.   Review of Systems  Constitutional: Negative for chills and fever.  HENT: Negative.   Eyes:       Cataract problem x 4 month.  Respiratory: Negative.   Cardiovascular:       Tachycardia over 10 years.  Gastrointestinal:       Irritable bowl and Acid reflux.  Genitourinary: Negative.   Musculoskeletal:       Head and neck, shoulder pain since 1988.  Skin:       Hx of Skin cancer right lower limb.     OBJECTIVE: DERMATOLOGIC EXAMINATION: Callused skin under 2nd MPJ area bilateral.  VASCULAR EXAMINATION OF LOWER LIMBS: All pedal pulses are palpable with normal pulsation.  Capillary Filling times within 3 seconds in all digits.  No visible edema or erythema noted. Temperature gradient from tibial crest to dorsum of foot is within normal bilateral.  NEUROLOGIC EXAMINATION OF THE LOWER LIMBS: All epicritic and tactile sensations grossly intact. Sharp and Dull discriminatory sensations at the plantar ball of hallux is intact bilateral.   MUSCULOSKELETAL EXAMINATION: Positive for hypermobile first ray bilateral. Tight Achilles tendon bilateral.  RADIOGRAPHIC FINDINGS: Weight bearing bilateral foot x-rays ordered. Noted of long 2nd metatarsal with increased lateral deviation angle of CCJ on both feet on AP view. Lateral view does not appear to be done under weight bearing and cannot determine the position of the first ray in relation to the 2nd.  ASSESSMENT: Hypermobile first ray bilateral. Metatarsalgia 2nd MPJ bilateral. Compensatory STJ hyperpronation with load bearing bilateral.  PLAN: Reviewed clinical findings and available treatment options, padding, injection,  orthotics, possible surgical. Metatarsal binder size medium x 2 dispensed to assist stability of the hypermobile first ray. Continue with metatarsal padded shoe liner. Return after x-rays are done.

## 2018-02-13 ENCOUNTER — Encounter: Payer: Self-pay | Admitting: Podiatry

## 2018-02-21 DIAGNOSIS — R1031 Right lower quadrant pain: Secondary | ICD-10-CM | POA: Diagnosis not present

## 2018-02-21 DIAGNOSIS — K3184 Gastroparesis: Secondary | ICD-10-CM | POA: Diagnosis not present

## 2018-02-21 DIAGNOSIS — K581 Irritable bowel syndrome with constipation: Secondary | ICD-10-CM | POA: Diagnosis not present

## 2018-02-21 DIAGNOSIS — K219 Gastro-esophageal reflux disease without esophagitis: Secondary | ICD-10-CM | POA: Diagnosis not present

## 2018-02-25 IMAGING — DX DG CHEST 2V
2 series · 2 of 2 positions shown · non-contrast
Comparison: January 30, 2017

CLINICAL DATA: Cough and congestion

EXAM:
CHEST  2 VIEW

[chest pa]
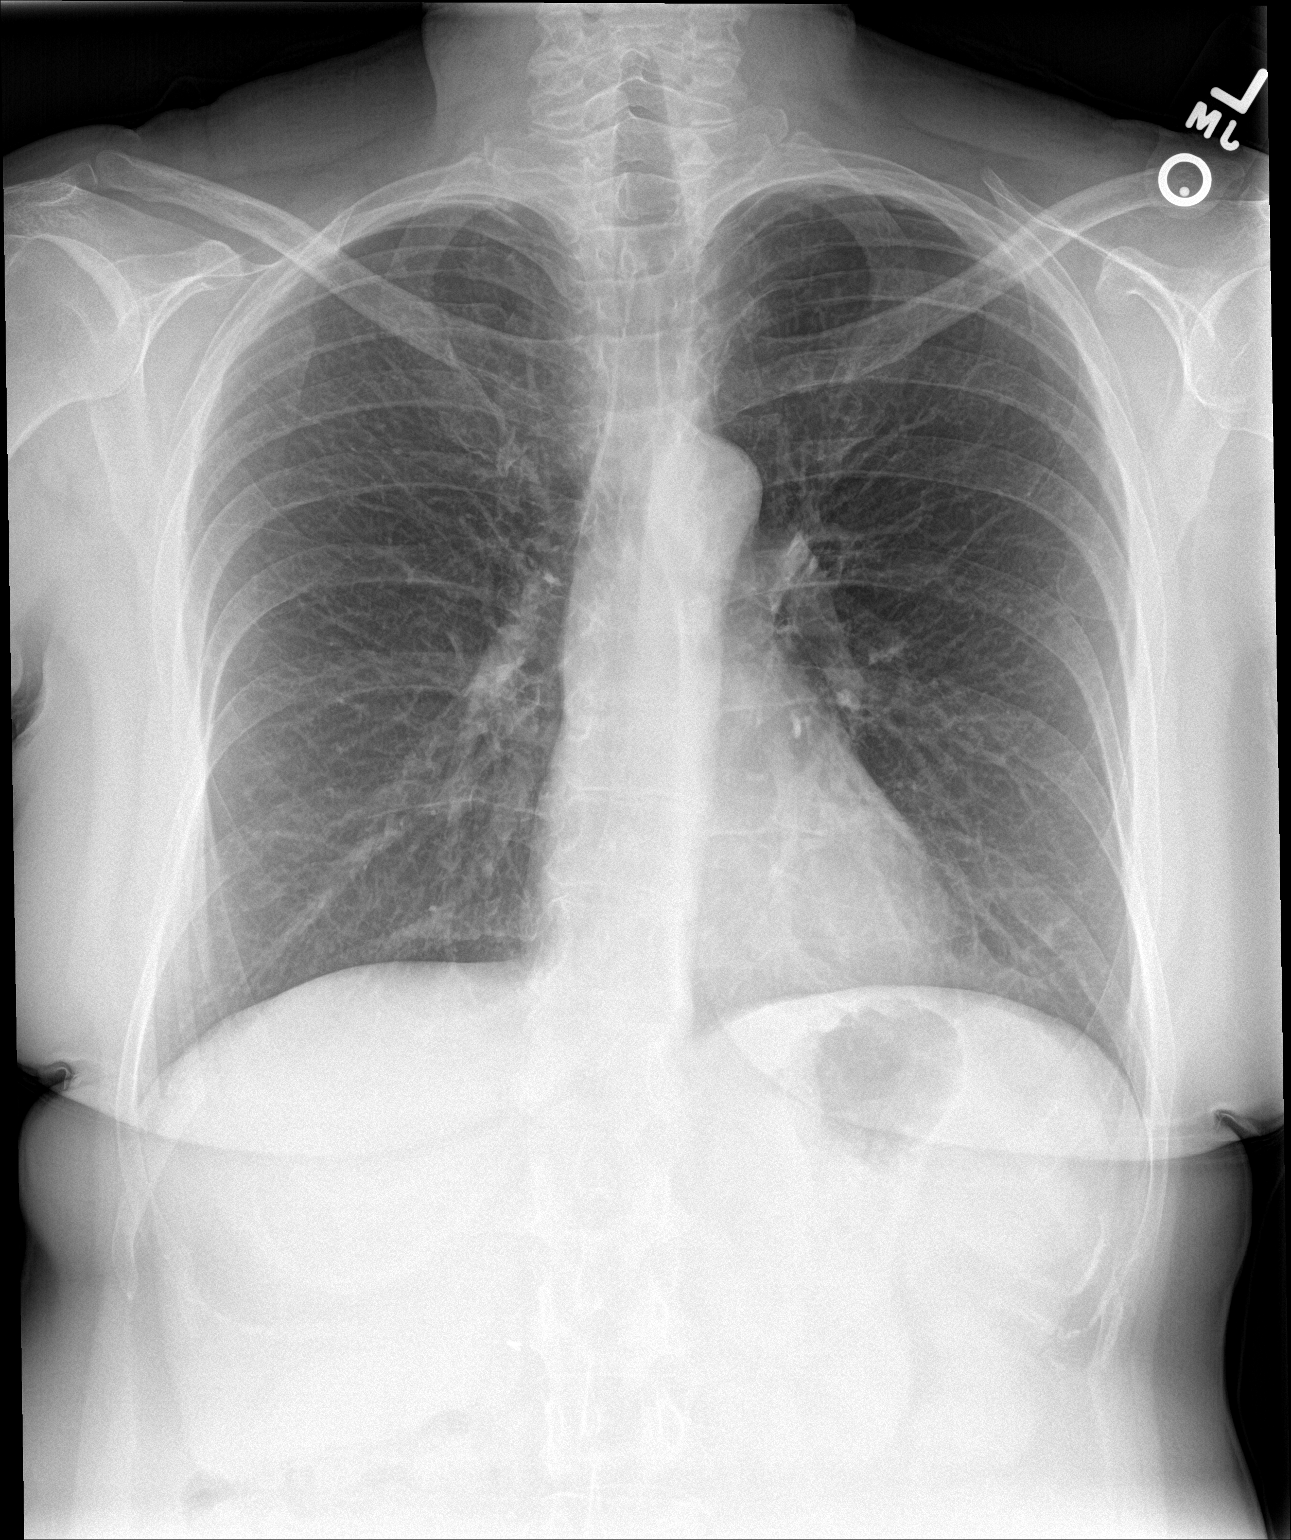

[chest lat]
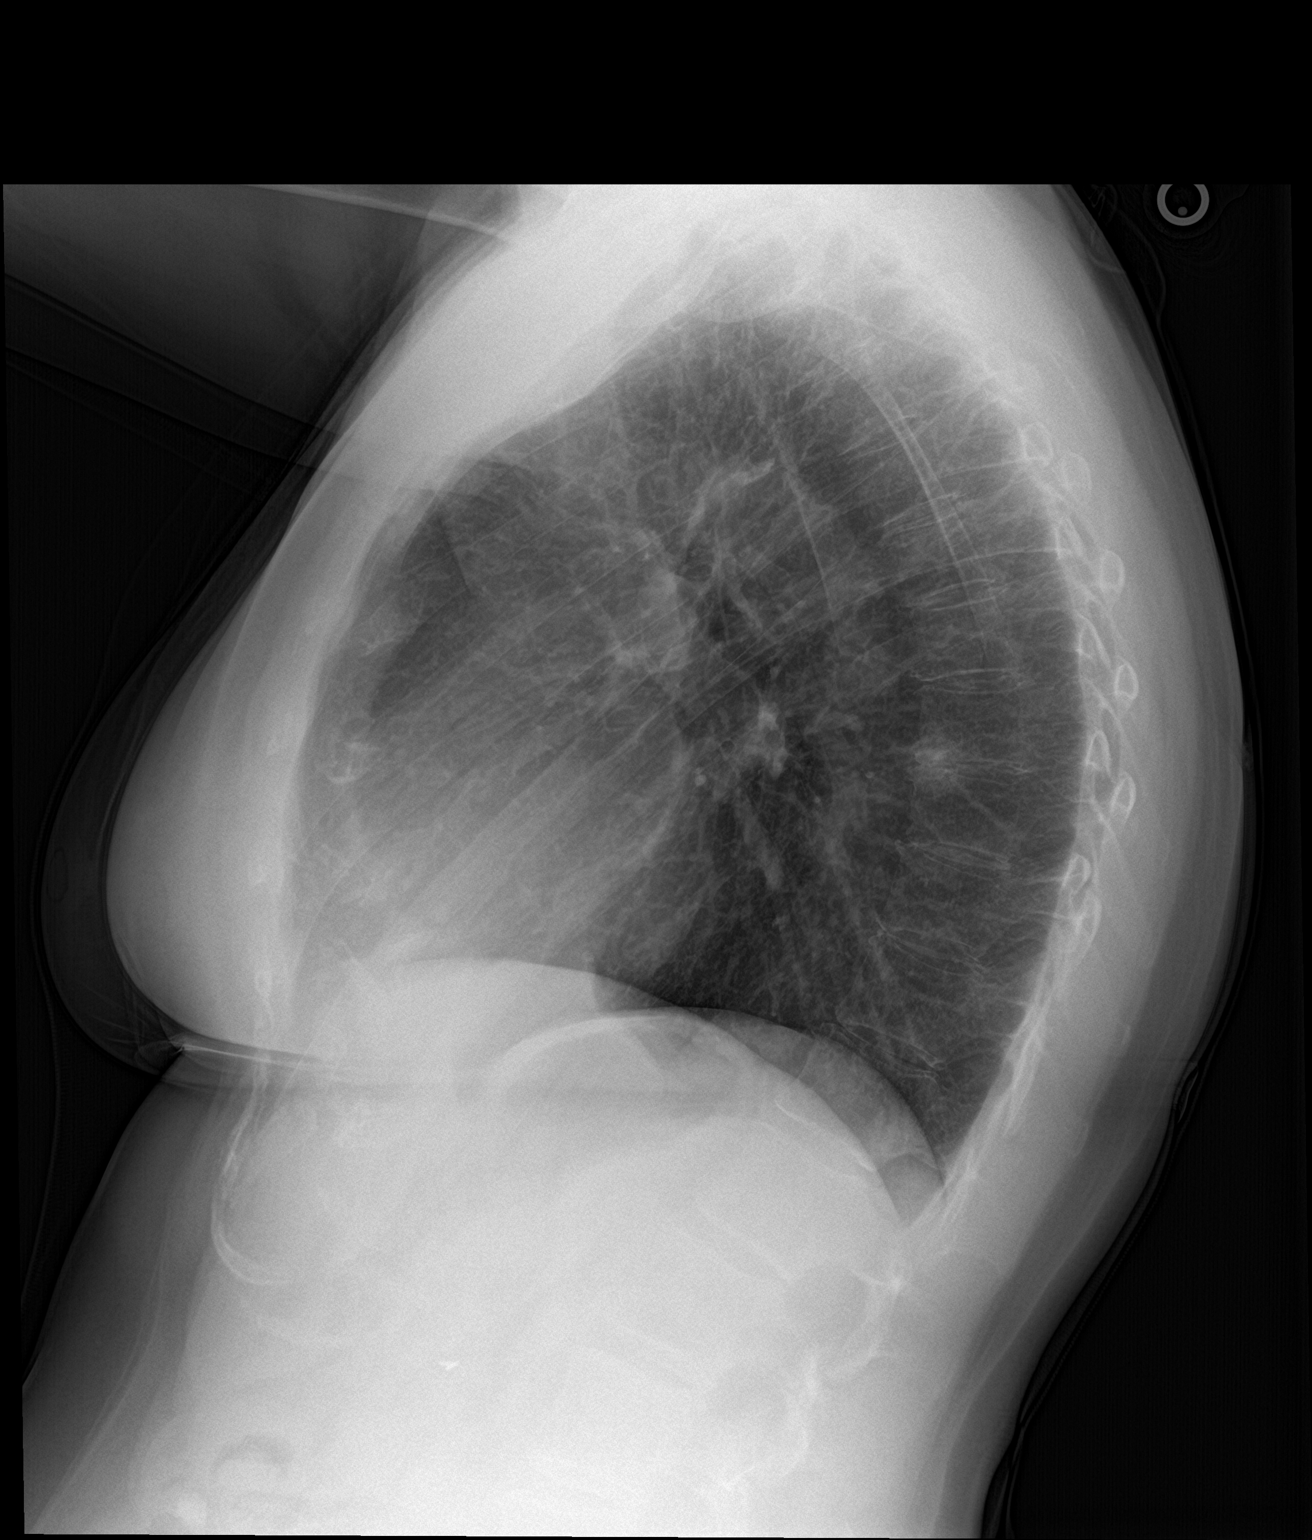

[2 of 2 positions shown; findings below may reference images not displayed]

FINDINGS: There is no edema or consolidation. The heart size and pulmonary
vascularity are normal. No adenopathy. No evident bone lesions..
IMPRESSION: No edema or consolidation.

## 2018-02-26 ENCOUNTER — Ambulatory Visit: Payer: Medicare HMO | Admitting: Podiatry

## 2018-02-26 ENCOUNTER — Encounter: Payer: Self-pay | Admitting: Podiatry

## 2018-02-26 DIAGNOSIS — M7741 Metatarsalgia, right foot: Secondary | ICD-10-CM

## 2018-02-26 DIAGNOSIS — M21969 Unspecified acquired deformity of unspecified lower leg: Secondary | ICD-10-CM

## 2018-02-26 DIAGNOSIS — M216X1 Other acquired deformities of right foot: Secondary | ICD-10-CM

## 2018-02-26 DIAGNOSIS — M7742 Metatarsalgia, left foot: Secondary | ICD-10-CM | POA: Diagnosis not present

## 2018-02-26 DIAGNOSIS — M216X2 Other acquired deformities of left foot: Secondary | ICD-10-CM

## 2018-02-26 NOTE — Patient Instructions (Signed)
Seen for 2nd metatarsalgia bilateral R>L.  Continue with arch binder on left. Cortisone injection given to right 2nd MPJ area. Return for pre op for right 2nd Metatarsal osteotomy.

## 2018-02-26 NOTE — Progress Notes (Signed)
Subjective: 62 y.o. year old female patient presents to discuss x-ray findings and further treatment options.  Objective: Dermatologic: Normal findings. Vascular: Pedal pulses are all palpable. Orthopedic: hypermobile first ray bilateral. Long 2nd toe bilateral. Pain under 2nd MPJ area R>L. Neurologic: All epicritic and tactile sensations grossly intact.  Assessment: Hypermobile first ray bilateral. Long 2nd metatarsal with metatarsalgia right > left. Metatarsal binder helping only on left side. STJ excess pronation bilateral.  Treatment: Reviewed x-ray findings, long 2nd metatarsal bone where painful joint is. Reviewed findings and available treatment options. Right 2nd MPJ area injected with mixture of 4 mg Dexamethasone, 4 mg Triamcinolone, and 1 cc of 0.5% Marcaine plain. Patient tolerated well without difficulty.  Patient wants to have the long 2nd metatarsal bone corrected surgically. She will return after her Cataract surgery.

## 2018-02-28 ENCOUNTER — Encounter: Payer: Self-pay | Admitting: Family Medicine

## 2018-03-06 DIAGNOSIS — H2513 Age-related nuclear cataract, bilateral: Secondary | ICD-10-CM | POA: Diagnosis not present

## 2018-03-13 ENCOUNTER — Encounter: Payer: Self-pay | Admitting: Family Medicine

## 2018-03-15 DIAGNOSIS — H35363 Drusen (degenerative) of macula, bilateral: Secondary | ICD-10-CM | POA: Diagnosis not present

## 2018-03-15 DIAGNOSIS — H43811 Vitreous degeneration, right eye: Secondary | ICD-10-CM | POA: Diagnosis not present

## 2018-03-15 DIAGNOSIS — H31092 Other chorioretinal scars, left eye: Secondary | ICD-10-CM | POA: Diagnosis not present

## 2018-03-19 ENCOUNTER — Ambulatory Visit: Payer: Medicare HMO | Admitting: Podiatry

## 2018-03-19 ENCOUNTER — Encounter: Payer: Self-pay | Admitting: Podiatry

## 2018-03-19 DIAGNOSIS — M7742 Metatarsalgia, left foot: Secondary | ICD-10-CM

## 2018-03-19 DIAGNOSIS — M21969 Unspecified acquired deformity of unspecified lower leg: Secondary | ICD-10-CM

## 2018-03-19 DIAGNOSIS — M7741 Metatarsalgia, right foot: Secondary | ICD-10-CM

## 2018-03-19 NOTE — Progress Notes (Signed)
Subjective: 62 y.o. year old female patient presents requesting surgical consult on right foot. Her cataract surgery is not till May and wishes to have the foot surgery now.  Objective: Dermatologic: Normal findings. Vascular: Pedal pulses are all palpable. Orthopedic: Hypermobile first ray bilateral. Long 2nd metatarsal bilateral. Excess STJ pronation bilateral. Neurologic: All epicritic and tactile sensations grossly intact.  Assessment: Metatarsalgia due to long 2nd metatarsal bilateral. Hypermobile first ray bilateral. Hyperpronation STJ bilateral.  Treatment: Surgery consent form reviewed for Metatarsal osteotomy 2nd right.

## 2018-03-19 NOTE — Patient Instructions (Signed)
Surgery consent form reviewed for metatarsal osteotomy 2nd right.

## 2018-03-24 IMAGING — DX DG FOOT COMPLETE 3+V*L*
3 series · 3 of 3 positions shown · non-contrast
Comparison: None.

CLINICAL DATA: Bilateral foot pain.

EXAM:
LEFT FOOT - COMPLETE 3+ VIEW; RIGHT FOOT COMPLETE - 3+ VIEW

[foot ap]
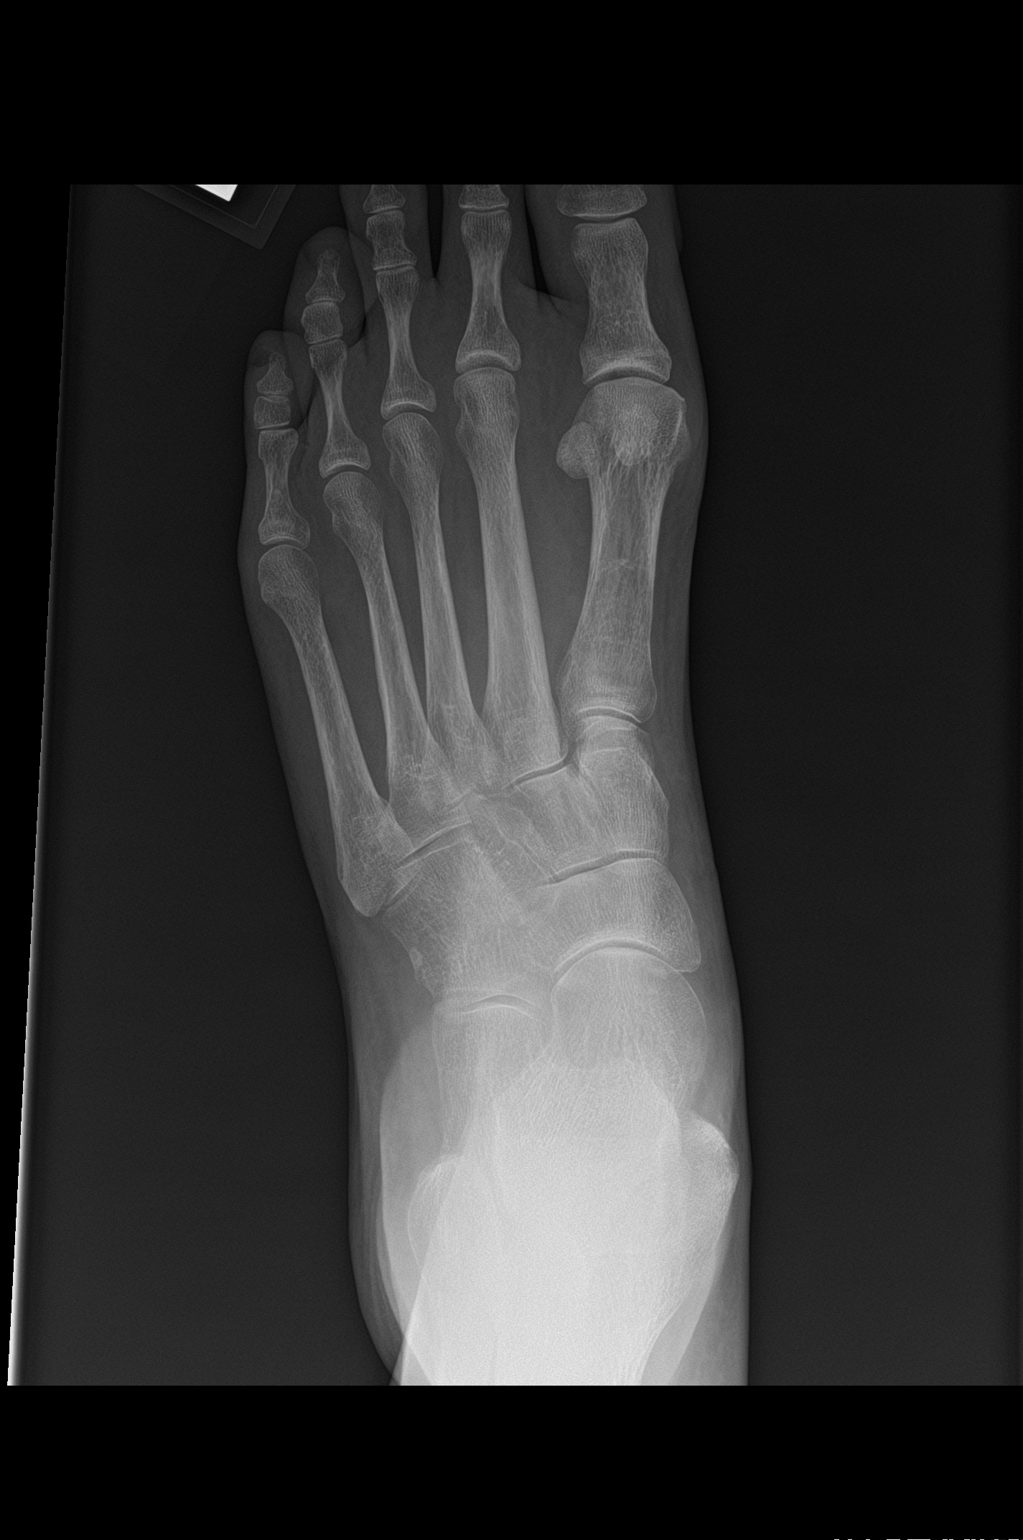

[foot obl]
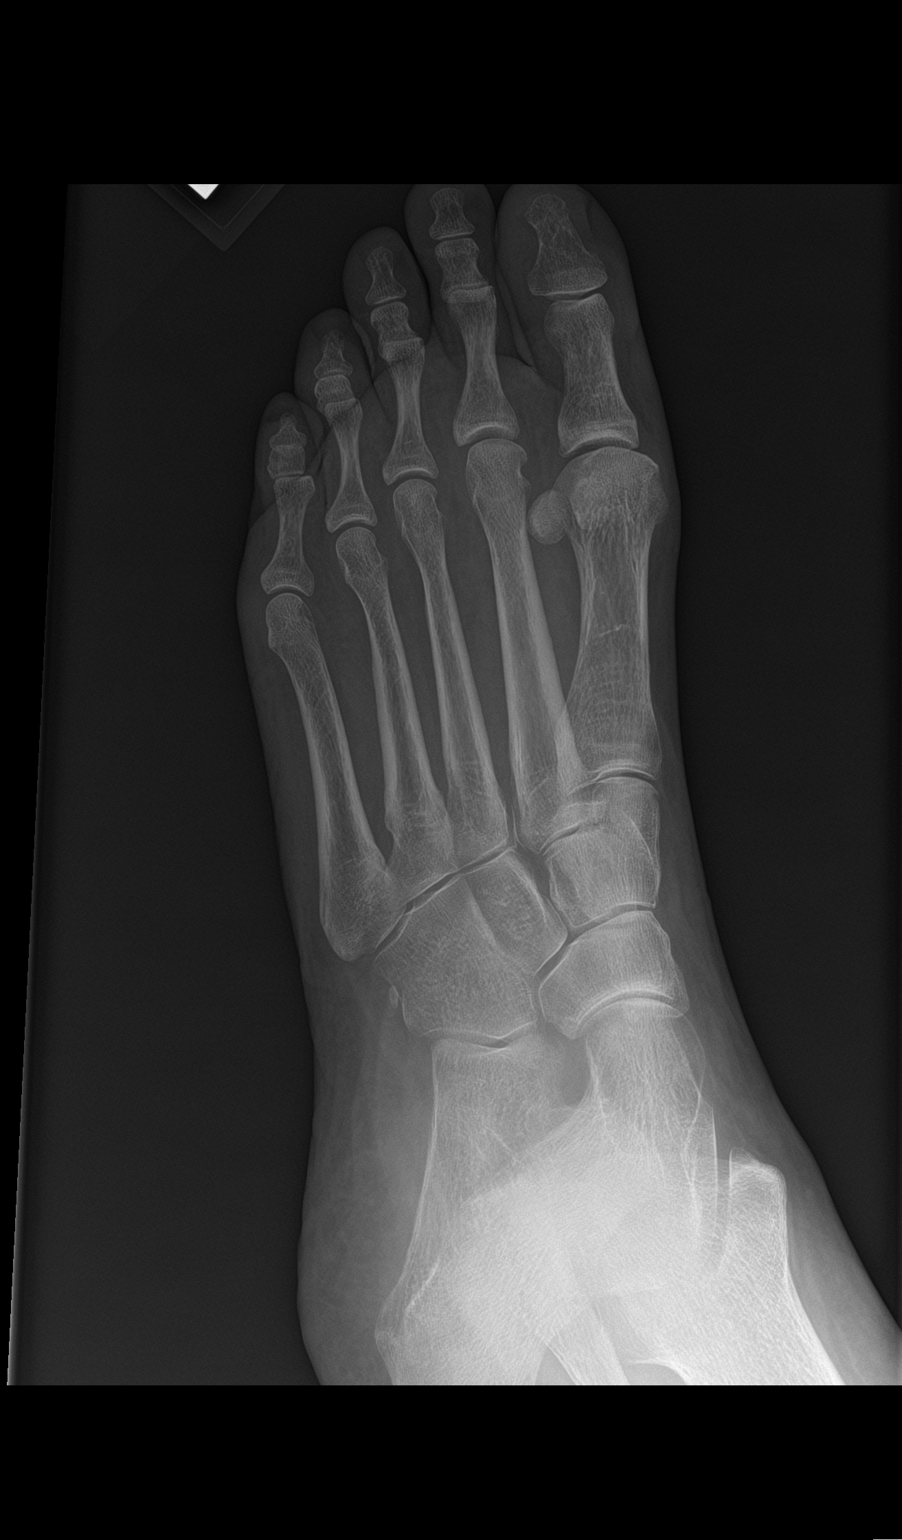

[foot lat]
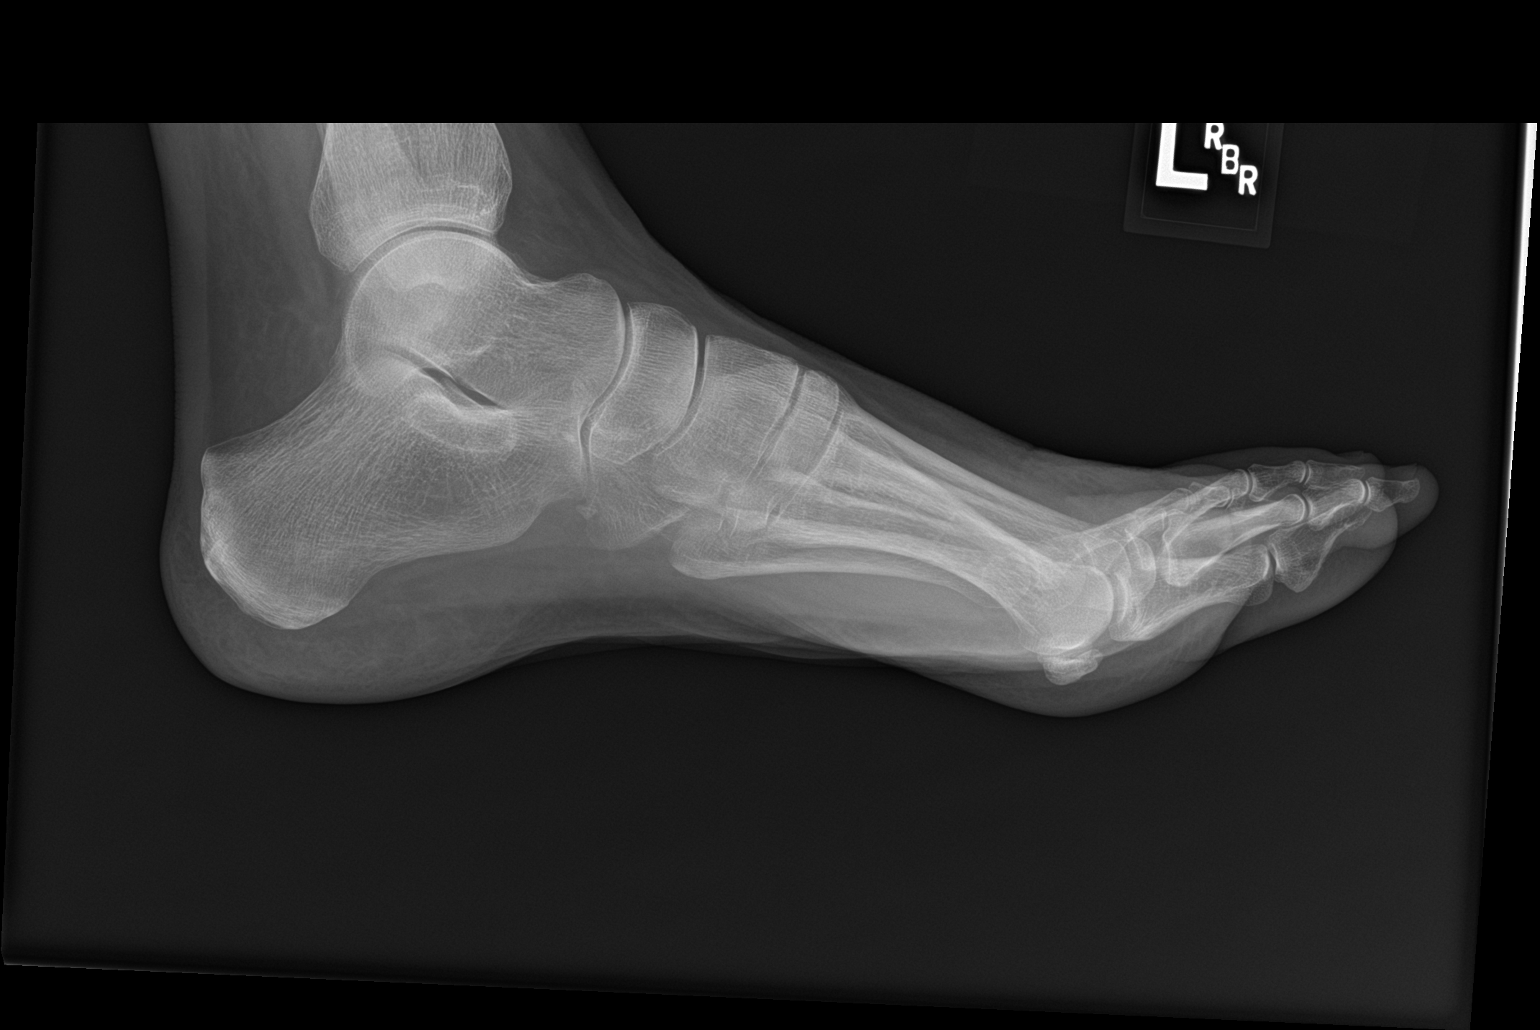

[3 of 3 positions shown; findings below may reference images not displayed]

FINDINGS: The joint spaces are maintained. Mild degenerative changes at the
first MTP joints bilaterally but no hallux valgus deformity. No
arthropathic changes. No fracture. Bilateral hindfoot valgus is
noted.
IMPRESSION: No acute bony findings or significant arthropathic changes.

## 2018-03-24 IMAGING — DX DG FOOT COMPLETE 3+V*R*
3 series · 3 of 3 positions shown · non-contrast
Comparison: None.

CLINICAL DATA: Bilateral foot pain.

EXAM:
LEFT FOOT - COMPLETE 3+ VIEW; RIGHT FOOT COMPLETE - 3+ VIEW

[foot ap]
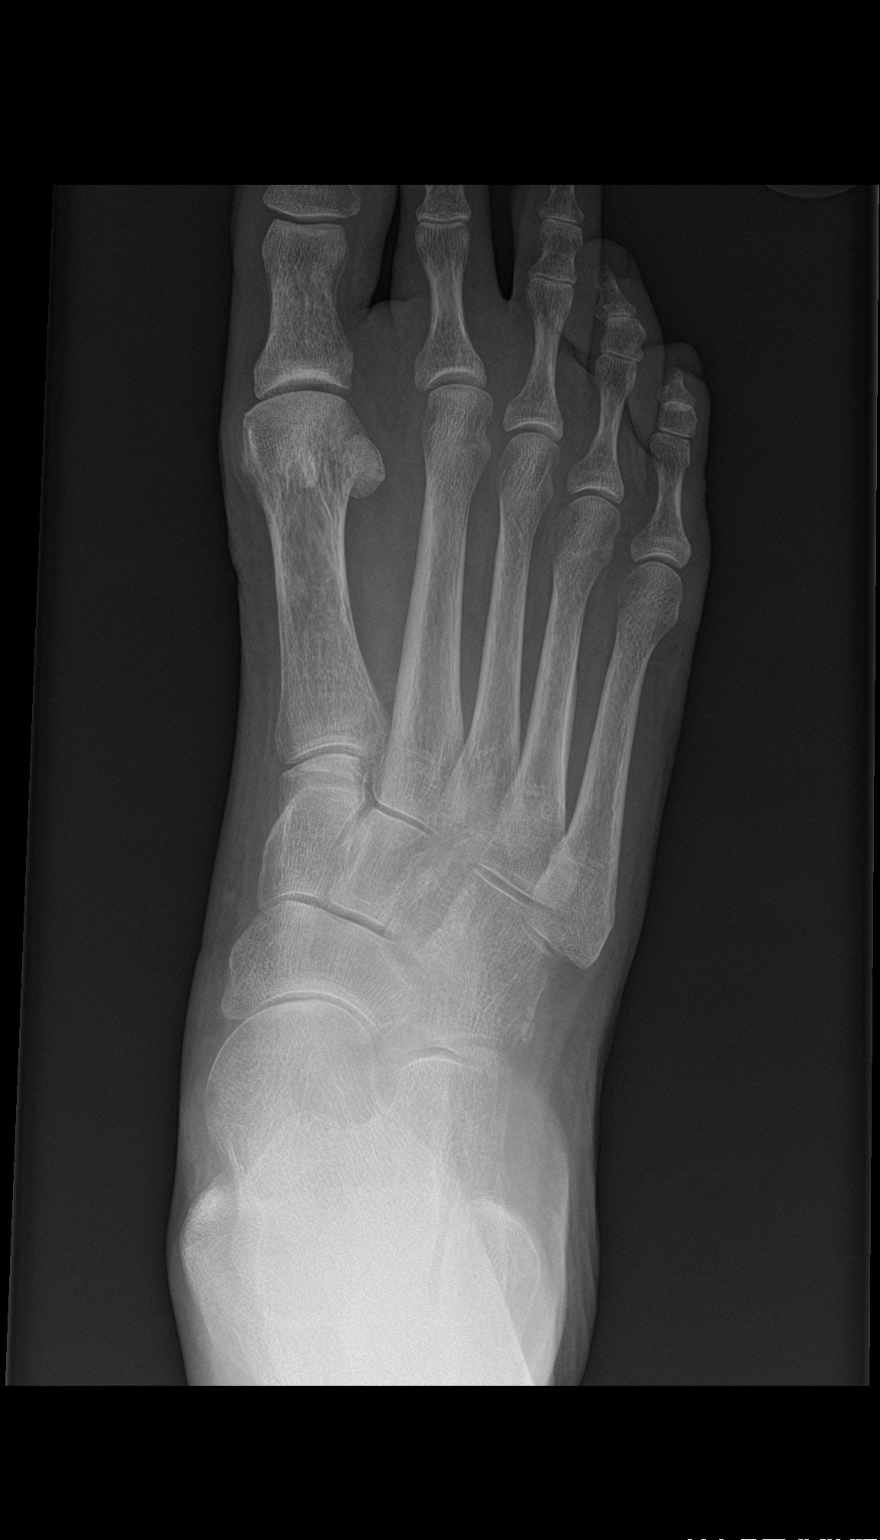

[foot obl]
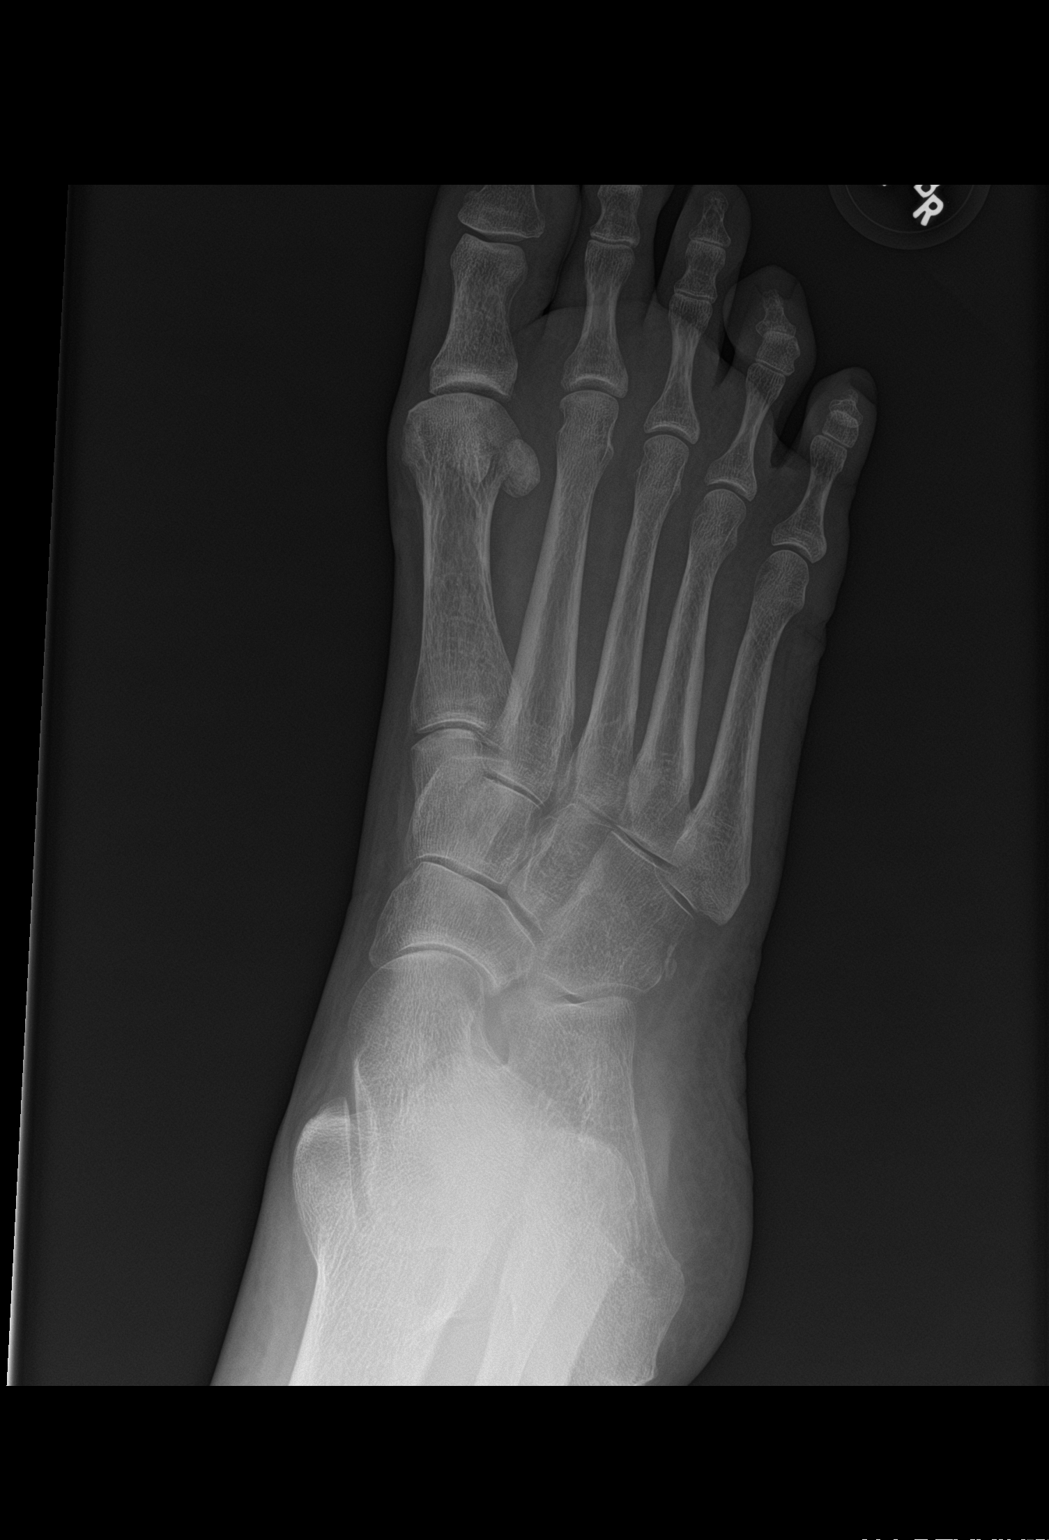

[foot lat]
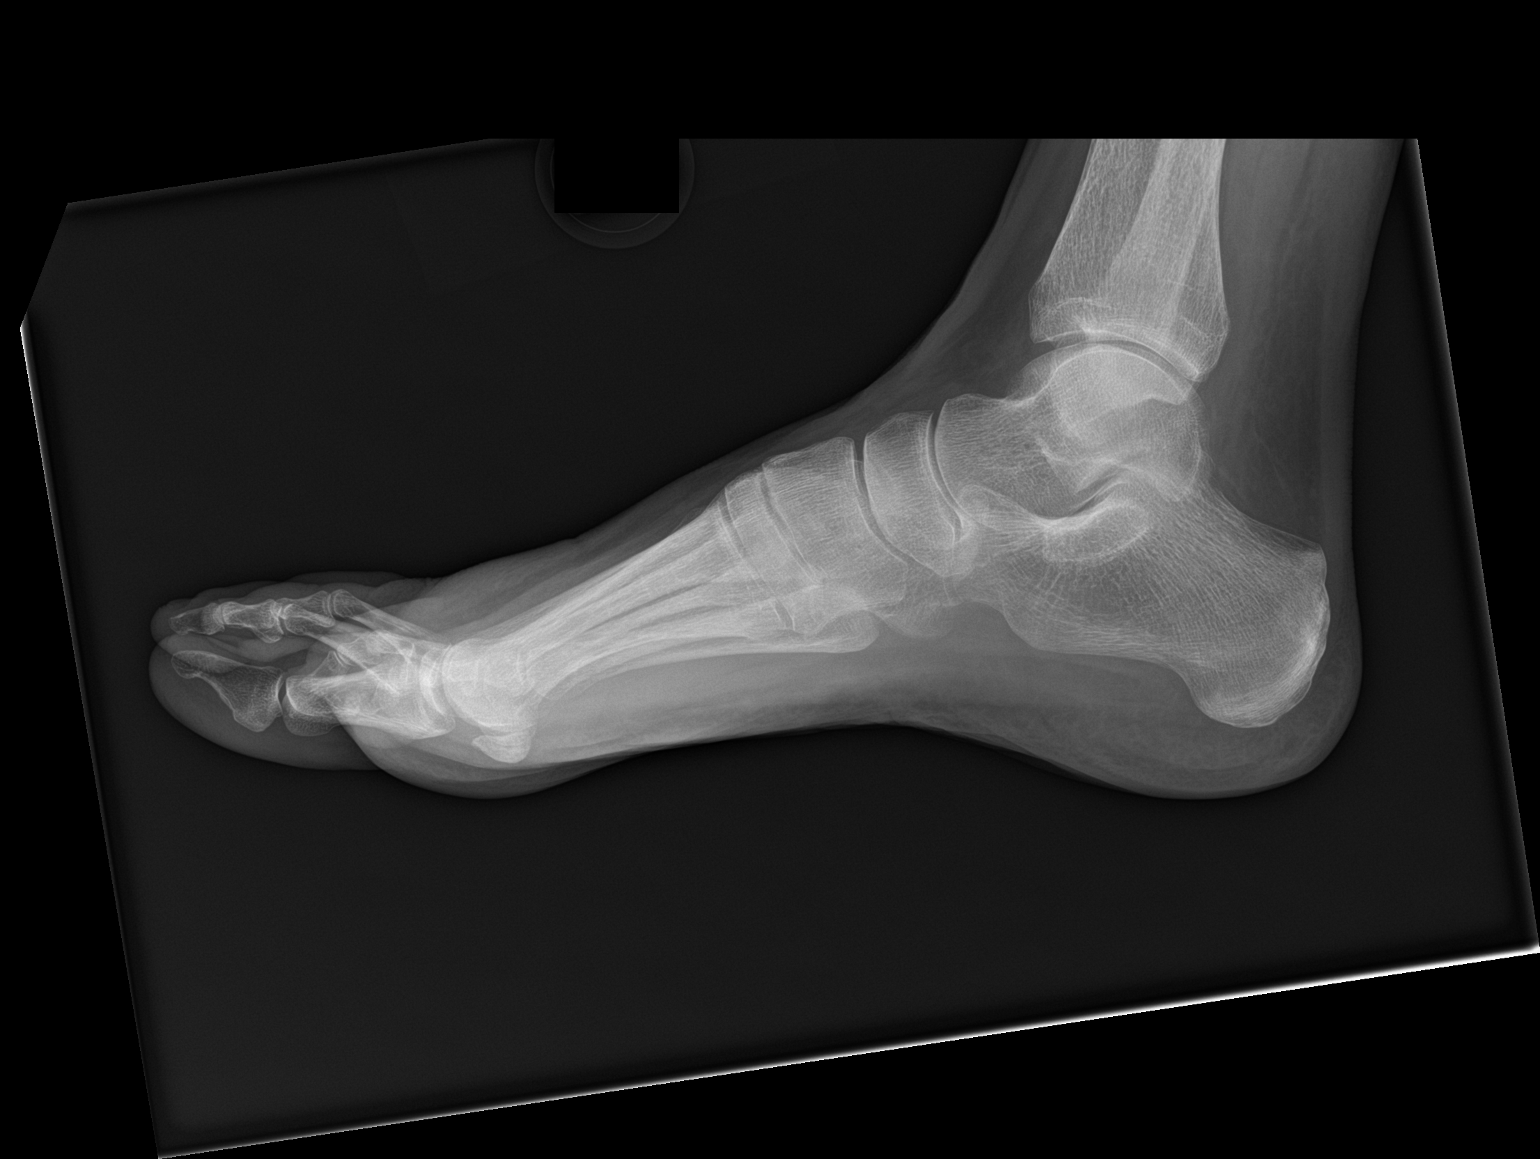

[3 of 3 positions shown; findings below may reference images not displayed]

FINDINGS: The joint spaces are maintained. Mild degenerative changes at the
first MTP joints bilaterally but no hallux valgus deformity. No
arthropathic changes. No fracture. Bilateral hindfoot valgus is
noted.
IMPRESSION: No acute bony findings or significant arthropathic changes.

## 2018-03-26 ENCOUNTER — Telehealth: Payer: Self-pay | Admitting: *Deleted

## 2018-03-26 NOTE — Telephone Encounter (Signed)
For CPT code 28308 benefits are as follows: There is no deductible. Patient will have to pay $295 and then insurance will cover 100%. The patient's max out of pocket is 5400 and patient has met $299.38 of this. A prior Josem Kaufmann is required. Form along with patient progress notes have been faxed to insurance

## 2018-03-27 NOTE — Telephone Encounter (Signed)
Received approval from insurance for CPT code [phone]. effectie date is 03/30/18-05/14/2018. Authorization # is [id number]

## 2018-03-27 NOTE — Telephone Encounter (Signed)
Called patient twice and line was busy. Faxed all demographic and insurance info and surgery info to [facility]

## 2018-03-28 NOTE — Telephone Encounter (Signed)
Called patient and left a message stating procedure was approved and let her know surgical center should be reaching out to her

## 2018-03-29 ENCOUNTER — Other Ambulatory Visit: Payer: Self-pay | Admitting: Podiatry

## 2018-03-29 ENCOUNTER — Telehealth: Payer: Self-pay | Admitting: *Deleted

## 2018-03-29 MED ORDER — TRAMADOL HCL 50 MG PO TABS: 50.0000 mg | ORAL_TABLET | Freq: Four times a day (QID) | ORAL | 3 refills | Status: DC | PRN

## 2018-03-29 NOTE — Telephone Encounter (Signed)
Patient called and wants to know if the orthotics would be more beneficial for her condition or the surgery and if the fact that she has osteoporosis will affect her having surgery. Per Dr. Caffie Pinto if patient wants to try orthotics first then we can try this. Notified patient that the osteoporosis should not prohibit her from having the surgery and we could try orthotics first and then do surgery if no benefit but most likely surgery would be the best course of action. Patient would like to proceed with surgery and paid $295 copay

## 2018-03-30 DIAGNOSIS — M216X1 Other acquired deformities of right foot: Secondary | ICD-10-CM | POA: Diagnosis not present

## 2018-03-30 DIAGNOSIS — M216X9 Other acquired deformities of unspecified foot: Secondary | ICD-10-CM | POA: Diagnosis not present

## 2018-04-02 ENCOUNTER — Other Ambulatory Visit: Payer: Self-pay | Admitting: Family Medicine

## 2018-04-02 NOTE — Telephone Encounter (Signed)
Please call pt and verify  She is getting same med from foot doctor.

## 2018-04-03 ENCOUNTER — Ambulatory Visit (INDEPENDENT_AMBULATORY_CARE_PROVIDER_SITE_OTHER): Payer: Medicare HMO | Admitting: Podiatry

## 2018-04-03 DIAGNOSIS — M216X1 Other acquired deformities of right foot: Secondary | ICD-10-CM

## 2018-04-03 DIAGNOSIS — M216X2 Other acquired deformities of left foot: Secondary | ICD-10-CM

## 2018-04-03 DIAGNOSIS — M7741 Metatarsalgia, right foot: Secondary | ICD-10-CM | POA: Diagnosis not present

## 2018-04-03 DIAGNOSIS — M21969 Unspecified acquired deformity of unspecified lower leg: Secondary | ICD-10-CM | POA: Diagnosis not present

## 2018-04-03 DIAGNOSIS — M7742 Metatarsalgia, left foot: Secondary | ICD-10-CM

## 2018-04-03 NOTE — Telephone Encounter (Signed)
Pt left me a VM stating that she was also getting this from Dr Caffie Pinto but is needing it more frequently due to post-op issues.  I have denied the RF request and called and advised pt on her ID'd VM that since she is still seeing Dr Caffie Pinto and this is for post op pain- this RX needs to be taken care of by specialist only- both providers cannot be witting RX.

## 2018-04-03 NOTE — Telephone Encounter (Signed)
Called pt and left VM requesting she call back with info concerning this med refill

## 2018-04-04 ENCOUNTER — Encounter: Payer: Medicare HMO | Admitting: Podiatry

## 2018-04-04 ENCOUNTER — Encounter: Payer: Self-pay | Admitting: Family Medicine

## 2018-04-04 ENCOUNTER — Encounter: Payer: Self-pay | Admitting: Podiatry

## 2018-04-04 MED ORDER — TRAMADOL HCL 50 MG PO TABS: 50.0000 mg | ORAL_TABLET | Freq: Four times a day (QID) | ORAL | 3 refills | Status: DC | PRN

## 2018-04-04 NOTE — Progress Notes (Signed)
62 year old female presents for 5 day post op follow up on Metatarsal osteotomy 2nd right, 03/30/18. Denies any fever or chills. Taking Tramadol for pain management. Normal wound healing noted. Normal post op x-ray findings following Metatarsal osteotomy 2nd right. Dressing changed. Return in one week.

## 2018-04-04 NOTE — Patient Instructions (Signed)
5 day post op. Normal wound healing. Redness noted due to tight dressing. Area cleansed and redressed. Return in one week.

## 2018-04-09 ENCOUNTER — Encounter: Payer: Self-pay | Admitting: Podiatry

## 2018-04-09 ENCOUNTER — Ambulatory Visit (INDEPENDENT_AMBULATORY_CARE_PROVIDER_SITE_OTHER): Payer: Medicare HMO | Admitting: Podiatry

## 2018-04-09 VITALS — BP 149/79 | HR 82

## 2018-04-09 DIAGNOSIS — Z9889 Other specified postprocedural states: Secondary | ICD-10-CM

## 2018-04-09 NOTE — Progress Notes (Signed)
62 year old female presents for 2 week follow up on Metatarsal osteotomy 2nd right, 03/30/18. Stated that she felt swelling on right lower limb with tightness but no pain since the surgery. Patient denies any calf pain or increase in temperature. The first post op visit revealed some ankle edema due to tight Ace bandage. The Ace wrap was placed to forefoot only. The swelling has extended above ankle.   Assessment: Normal post op wound healing. Lower limb edema without increase in temperature or pain. Pitting edema right lower limb.  Plan: Suture removed. Advised to do more none weight bearing exercise. Patient will use stationary bide at home. Continue with semi weight bearing activity. Advised to go to ER if fever or excess pain on the affected limb develop. Return in 2 weeks.

## 2018-04-09 NOTE — Patient Instructions (Signed)
2 weeks post op wound healing normal with exception of lower limb edema without pain or increase in temperature. Possible due to immobility of right lower limb. Suture removed. Instructed to do more none weight bearing exercise of the right limb. Use stationary bike if possible. Return in 2 weeks.

## 2018-04-12 ENCOUNTER — Telehealth: Payer: Self-pay | Admitting: *Deleted

## 2018-04-12 NOTE — Telephone Encounter (Signed)
Patient called [facility] and said that her incision was about to burst open and was very swollen. Called patient and per Dr. Caffie Pinto advised patient to meet her at the [facility] in the morning. Patient states she does not have a ride to get to the surgical center. I did advise patient that if she was concerned to go to the ER or have EMS come to her. Patient voiced understanding

## 2018-04-13 ENCOUNTER — Other Ambulatory Visit: Payer: Self-pay | Admitting: Podiatry

## 2018-04-13 ENCOUNTER — Emergency Department (HOSPITAL_BASED_OUTPATIENT_CLINIC_OR_DEPARTMENT_OTHER): Payer: Medicare HMO

## 2018-04-13 ENCOUNTER — Emergency Department (HOSPITAL_BASED_OUTPATIENT_CLINIC_OR_DEPARTMENT_OTHER)
Admission: EM | Admit: 2018-04-13 | Discharge: 2018-04-13 | Disposition: A | Discharge status: Home / Self Care | Payer: Medicare HMO | Attending: Emergency Medicine | Admitting: Emergency Medicine

## 2018-04-13 ENCOUNTER — Encounter (HOSPITAL_BASED_OUTPATIENT_CLINIC_OR_DEPARTMENT_OTHER): Payer: Self-pay | Admitting: Emergency Medicine

## 2018-04-13 ENCOUNTER — Other Ambulatory Visit: Payer: Self-pay

## 2018-04-13 ENCOUNTER — Encounter: Payer: Self-pay | Admitting: Podiatry

## 2018-04-13 DIAGNOSIS — Z91040 Latex allergy status: Secondary | ICD-10-CM | POA: Diagnosis not present

## 2018-04-13 DIAGNOSIS — R079 Chest pain, unspecified: Secondary | ICD-10-CM | POA: Diagnosis present

## 2018-04-13 DIAGNOSIS — J449 Chronic obstructive pulmonary disease, unspecified: Secondary | ICD-10-CM | POA: Diagnosis not present

## 2018-04-13 DIAGNOSIS — R600 Localized edema: Secondary | ICD-10-CM | POA: Diagnosis not present

## 2018-04-13 DIAGNOSIS — J45909 Unspecified asthma, uncomplicated: Secondary | ICD-10-CM | POA: Insufficient documentation

## 2018-04-13 DIAGNOSIS — Z79899 Other long term (current) drug therapy: Secondary | ICD-10-CM | POA: Diagnosis not present

## 2018-04-13 DIAGNOSIS — M7989 Other specified soft tissue disorders: Secondary | ICD-10-CM | POA: Diagnosis not present

## 2018-04-13 DIAGNOSIS — R0789 Other chest pain: Secondary | ICD-10-CM | POA: Insufficient documentation

## 2018-04-13 DIAGNOSIS — Z85828 Personal history of other malignant neoplasm of skin: Secondary | ICD-10-CM | POA: Insufficient documentation

## 2018-04-13 LAB — BASIC METABOLIC PANEL
Anion gap: 7 (ref 5–15)
BUN: 6 mg/dL (ref 6–20)
CO2: 25 mmol/L (ref 22–32)
Calcium: 9.8 mg/dL (ref 8.9–10.3)
Chloride: 104 mmol/L (ref 101–111)
Creatinine, Ser: 0.63 mg/dL (ref 0.44–1.00)
GFR calc Af Amer: >60 mL/min (ref >60)
GFR calc non Af Amer: >60 mL/min (ref >60)
Glucose, Bld: 100 mg/dL — ABNORMAL HIGH (ref 65–99)
Potassium: 3.8 mmol/L (ref 3.5–5.1)
Sodium: 136 mmol/L (ref 135–145)

## 2018-04-13 LAB — CBC
HCT: 41.2 % (ref 36.0–46.0)
Hemoglobin: 14.6 g/dL (ref 12.0–15.0)
MCH: 30.7 pg (ref 26.0–34.0)
MCHC: 35.4 g/dL (ref 30.0–36.0)
MCV: 86.7 fL (ref 78.0–100.0)
Platelets: 251 10*3/uL (ref 150–400)
RBC: 4.75 MIL/uL (ref 3.87–5.11)
RDW: 12.6 % (ref 11.5–15.5)
WBC: 7.4 10*3/uL (ref 4.0–10.5)

## 2018-04-13 LAB — TROPONIN I
Troponin I: <0.03 ng/mL (ref <0.03)
Troponin I: <0.03 ng/mL (ref <0.03)

## 2018-04-13 MED ORDER — SODIUM CHLORIDE 0.9 % IV BOLUS: 1000.0000 mL | Freq: Once | INTRAVENOUS | Status: DC

## 2018-04-13 MED ORDER — METHYLPREDNISOLONE 4 MG PO TBPK: ORAL_TABLET | ORAL | 1 refills | Status: DC

## 2018-04-13 NOTE — ED Notes (Signed)
ED Provider at bedside. 

## 2018-04-13 NOTE — ED Notes (Signed)
Pt and family understood dc material. NAD noted 
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
## 2018-04-13 NOTE — ED Notes (Signed)
Patient transported to Ultrasound 

## 2018-04-13 NOTE — Discharge Instructions (Signed)
Please read and follow all provided instructions.  Your diagnoses today include:  1. Atypical chest pain    Tests performed today include:  An EKG of your heart  A chest x-ray  Cardiac enzymes - a blood test for heart muscle damage  Blood counts and electrolytes  Ultrasound of your leg - no sign of blood clot  Vital signs. See below for your results today.   Medications prescribed:   None  Take any prescribed medications only as directed.  Follow-up instructions: Please follow-up with your primary care provider as soon as you can for further evaluation of your symptoms.   Return instructions:  SEEK IMMEDIATE MEDICAL ATTENTION IF:  You have severe chest pain, especially if the pain is crushing or pressure-like and spreads to the arms, back, neck, or jaw, or if you have sweating, nausea (feeling sick to your stomach), or shortness of breath. THIS IS AN EMERGENCY. Don't wait to see if the pain will go away. Get medical help at once. Call 911 or 0 (operator). DO NOT drive yourself to the hospital.   Your chest pain gets worse and does not go away with rest.   You have an attack of chest pain lasting longer than usual, despite rest and treatment with the medications your caregiver has prescribed.   You wake from sleep with chest pain or shortness of breath.  You feel dizzy or faint.  You have chest pain not typical of your usual pain for which you originally saw your caregiver.   You have any other emergent concerns regarding your health.  Additional Information: Chest pain comes from many different causes. Your caregiver has diagnosed you as having chest pain that is not specific for one problem, but does not require admission.  You are at low risk for an acute heart condition or other serious illness.   Your vital signs today were: BP (!) 147/77    Pulse 75    Temp 97.8 F (36.6 C) (Oral)    Resp 11    Ht 5\' 6"  (1.676 m)    Wt 57.6 kg (127 lb)    SpO2 100%    BMI 20.50  kg/m  If your blood pressure (BP) was elevated above 135/85 this visit, please have this repeated by your doctor within one month. --------------

## 2018-04-13 NOTE — ED Triage Notes (Signed)
Reports left chest, shoulder and arm pain x 2 days.  Denies shortness of breath, nausea, vomiting.

## 2018-04-18 ENCOUNTER — Telehealth: Payer: Self-pay | Admitting: Emergency Medicine

## 2018-04-18 DIAGNOSIS — H25812 Combined forms of age-related cataract, left eye: Secondary | ICD-10-CM | POA: Diagnosis not present

## 2018-04-18 DIAGNOSIS — H2512 Age-related nuclear cataract, left eye: Secondary | ICD-10-CM | POA: Diagnosis not present

## 2018-04-18 NOTE — Telephone Encounter (Signed)
Patient called to request post ED follow-up visit with Dr.Metheney on Monday, Apr 23, 2018. This was arranged and message left on her husband's phone VM since her line has been busy for 45 min,.

## 2018-04-23 ENCOUNTER — Ambulatory Visit (INDEPENDENT_AMBULATORY_CARE_PROVIDER_SITE_OTHER): Payer: Medicare HMO | Admitting: Family Medicine

## 2018-04-23 ENCOUNTER — Encounter: Payer: Self-pay | Admitting: Podiatry

## 2018-04-23 ENCOUNTER — Encounter: Payer: Self-pay | Admitting: Family Medicine

## 2018-04-23 ENCOUNTER — Ambulatory Visit (INDEPENDENT_AMBULATORY_CARE_PROVIDER_SITE_OTHER): Payer: Medicare HMO | Admitting: Podiatry

## 2018-04-23 ENCOUNTER — Ambulatory Visit (INDEPENDENT_AMBULATORY_CARE_PROVIDER_SITE_OTHER): Payer: Medicare HMO

## 2018-04-23 VITALS — BP 142/84 | HR 72 | Ht 66.0 in | Wt 133.0 lb

## 2018-04-23 DIAGNOSIS — R9389 Abnormal findings on diagnostic imaging of other specified body structures: Secondary | ICD-10-CM

## 2018-04-23 DIAGNOSIS — R0789 Other chest pain: Secondary | ICD-10-CM

## 2018-04-23 DIAGNOSIS — Z9889 Other specified postprocedural states: Secondary | ICD-10-CM

## 2018-04-23 DIAGNOSIS — M7989 Other specified soft tissue disorders: Secondary | ICD-10-CM | POA: Diagnosis not present

## 2018-04-23 DIAGNOSIS — M79671 Pain in right foot: Secondary | ICD-10-CM | POA: Diagnosis not present

## 2018-04-23 MED ORDER — TRAMADOL HCL 50 MG PO TABS: 50.0000 mg | ORAL_TABLET | Freq: Four times a day (QID) | ORAL | 3 refills | Status: DC | PRN

## 2018-04-23 NOTE — Patient Instructions (Signed)
4 weeks post op wound remain same with some swelling. Will do post op x-ray to rule out displaced osteotomy site. X-ray order placed.
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
## 2018-04-23 NOTE — Progress Notes (Signed)
Subjective: 62 year old female presents for 4 week follow up on Metatarsal osteotomy 2nd right, 03/30/18. Patient stated that right foot is hurting more than before the surgery. Patient was placed in Medrol dose pack last Friday. She was in ER for heart attack like symptoms over the weekend.  Objective: Right lower limb edema with mild temperature and pain. No bright redness associated with surgery site. Well healed incision site.  Assessment: Normal post op wound healing. Lower limb edema  with mild increase in pain R/O Allergic reaction to internal fixation screw.  Plan: Will do post op X-ray and make a plan to remove the fixation screw.

## 2018-04-24 ENCOUNTER — Encounter: Payer: Self-pay | Admitting: Family Medicine

## 2018-04-26 ENCOUNTER — Encounter: Payer: Self-pay | Admitting: Podiatry

## 2018-05-07 ENCOUNTER — Encounter: Payer: Self-pay | Admitting: Family Medicine

## 2018-05-07 ENCOUNTER — Encounter: Payer: Self-pay | Admitting: Podiatry

## 2018-05-07 MED ORDER — TRAMADOL HCL 50 MG PO TABS: 50.0000 mg | ORAL_TABLET | Freq: Four times a day (QID) | ORAL | 0 refills | Status: DC | PRN

## 2018-05-07 MED ORDER — FLUCONAZOLE 150 MG PO TABS: 150.0000 mg | ORAL_TABLET | Freq: Every day | ORAL | 1 refills | Status: DC

## 2018-05-07 MED ORDER — KETOCONAZOLE 2 % EX CREA: 1.0000 "application " | TOPICAL_CREAM | Freq: Every day | CUTANEOUS | 0 refills | Status: DC

## 2018-05-07 NOTE — Telephone Encounter (Signed)
Sent prescription for Diflucan, ketoconazole cream and tramadol.  Please let patient know.

## 2018-05-10 ENCOUNTER — Other Ambulatory Visit: Payer: Medicare HMO

## 2018-05-11 DIAGNOSIS — T8484XA Pain due to internal orthopedic prosthetic devices, implants and grafts, initial encounter: Secondary | ICD-10-CM | POA: Diagnosis not present

## 2018-05-11 DIAGNOSIS — I9789 Other postprocedural complications and disorders of the circulatory system, not elsewhere classified: Secondary | ICD-10-CM | POA: Diagnosis not present

## 2018-05-15 ENCOUNTER — Encounter: Payer: Self-pay | Admitting: Podiatry

## 2018-05-15 ENCOUNTER — Ambulatory Visit (INDEPENDENT_AMBULATORY_CARE_PROVIDER_SITE_OTHER): Payer: Medicare HMO | Admitting: Podiatry

## 2018-05-15 VITALS — BP 182/88 | HR 78 | Temp 97.7°F

## 2018-05-15 DIAGNOSIS — Z9889 Other specified postprocedural states: Secondary | ICD-10-CM

## 2018-05-15 NOTE — Patient Instructions (Signed)
Post op wound healing normal with mild redness over incision. Minimum swelling noted. Dressing changed. Return in one week.

## 2018-05-15 NOTE — Progress Notes (Signed)
5 day post op following removal of implant, screw from 2nd metatarsal head, 05/11/18. Patient denies any discomfort. Stated that since the screw was removed her swelling in right lower limb dissipated. Surgical incision is clean and dry with mild erythema over along the suture line, Nylon suture. Wound cleansed with Iodine and Amerigel dressing applied. Return in one week for suture removal.

## 2018-05-16 ENCOUNTER — Encounter: Payer: Medicare HMO | Admitting: Podiatry

## 2018-05-21 ENCOUNTER — Encounter: Payer: Self-pay | Admitting: Family Medicine

## 2018-05-21 ENCOUNTER — Other Ambulatory Visit: Payer: Medicare HMO

## 2018-05-21 ENCOUNTER — Ambulatory Visit (INDEPENDENT_AMBULATORY_CARE_PROVIDER_SITE_OTHER): Payer: Medicare HMO | Admitting: Family Medicine

## 2018-05-21 ENCOUNTER — Encounter: Payer: Self-pay | Admitting: Podiatry

## 2018-05-21 ENCOUNTER — Ambulatory Visit (INDEPENDENT_AMBULATORY_CARE_PROVIDER_SITE_OTHER): Payer: Medicare HMO | Admitting: Podiatry

## 2018-05-21 VITALS — BP 159/70 | HR 80 | Wt 133.0 lb

## 2018-05-21 DIAGNOSIS — Y831 Surgical operation with implant of artificial internal device as the cause of abnormal reaction of the patient, or of later complication, without mention of misadventure at the time of the procedure: Secondary | ICD-10-CM

## 2018-05-21 DIAGNOSIS — R9389 Abnormal findings on diagnostic imaging of other specified body structures: Secondary | ICD-10-CM

## 2018-05-21 DIAGNOSIS — I9789 Other postprocedural complications and disorders of the circulatory system, not elsewhere classified: Secondary | ICD-10-CM

## 2018-05-21 DIAGNOSIS — Z9889 Other specified postprocedural states: Secondary | ICD-10-CM

## 2018-05-21 MED ORDER — ALBUTEROL SULFATE (2.5 MG/3ML) 0.083% IN NEBU
2.5000 mg | INHALATION_SOLUTION | Freq: Once | RESPIRATORY_TRACT | Status: AC
  Administered 2018-05-21: 2.5 mg via RESPIRATORY_TRACT

## 2018-05-21 NOTE — Progress Notes (Signed)
S/P 10 day post op following removal of implant, screw from 2nd metatarsal head right foot, 05/11/18. 8 weeks metatarsal osteotomy 2nd met right foot. Stated that her foot and leg feels fine now since the screw was removed. Swelling is down. Redness around suture site is down. Sutures removed. Mefix tape placed. Wrapped with Compression stockinet. Home care instruction given. Return as needed.
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
## 2018-05-21 NOTE — Patient Instructions (Signed)
Post op wound healing normal with reduced swelling, redness, and pain. Suture removed. Mefix tape placed with Compression stockinet. Home care instruction given. Return as needed.

## 2018-05-23 IMAGING — CR DG CHEST 2V
2 series · 2 of 2 positions shown · non-contrast
Comparison: 01/16/2018.

CLINICAL DATA: Left chest and arm pain for the past 2 days.

EXAM:
CHEST - 2 VIEW

[w chest pa]
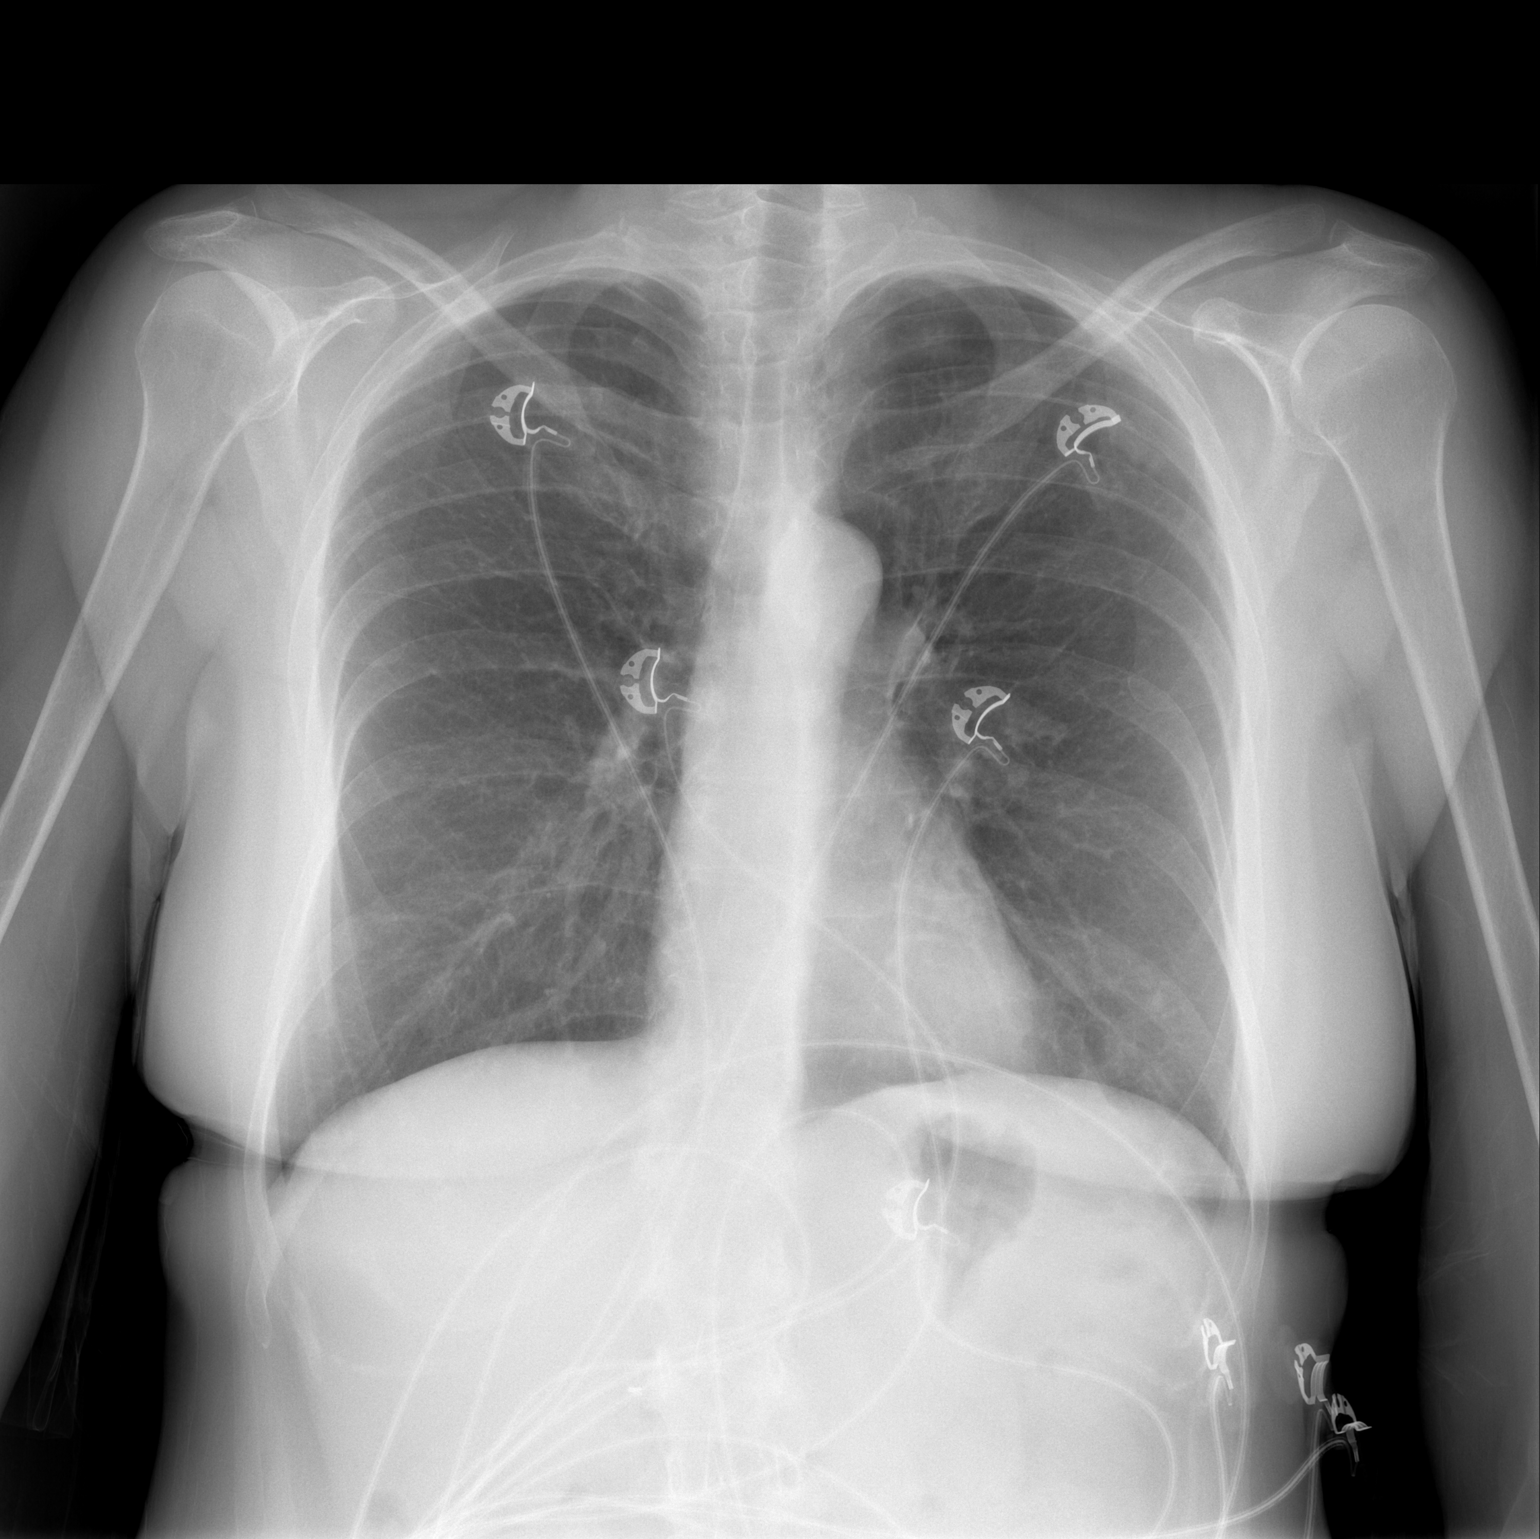

[w chest lat]
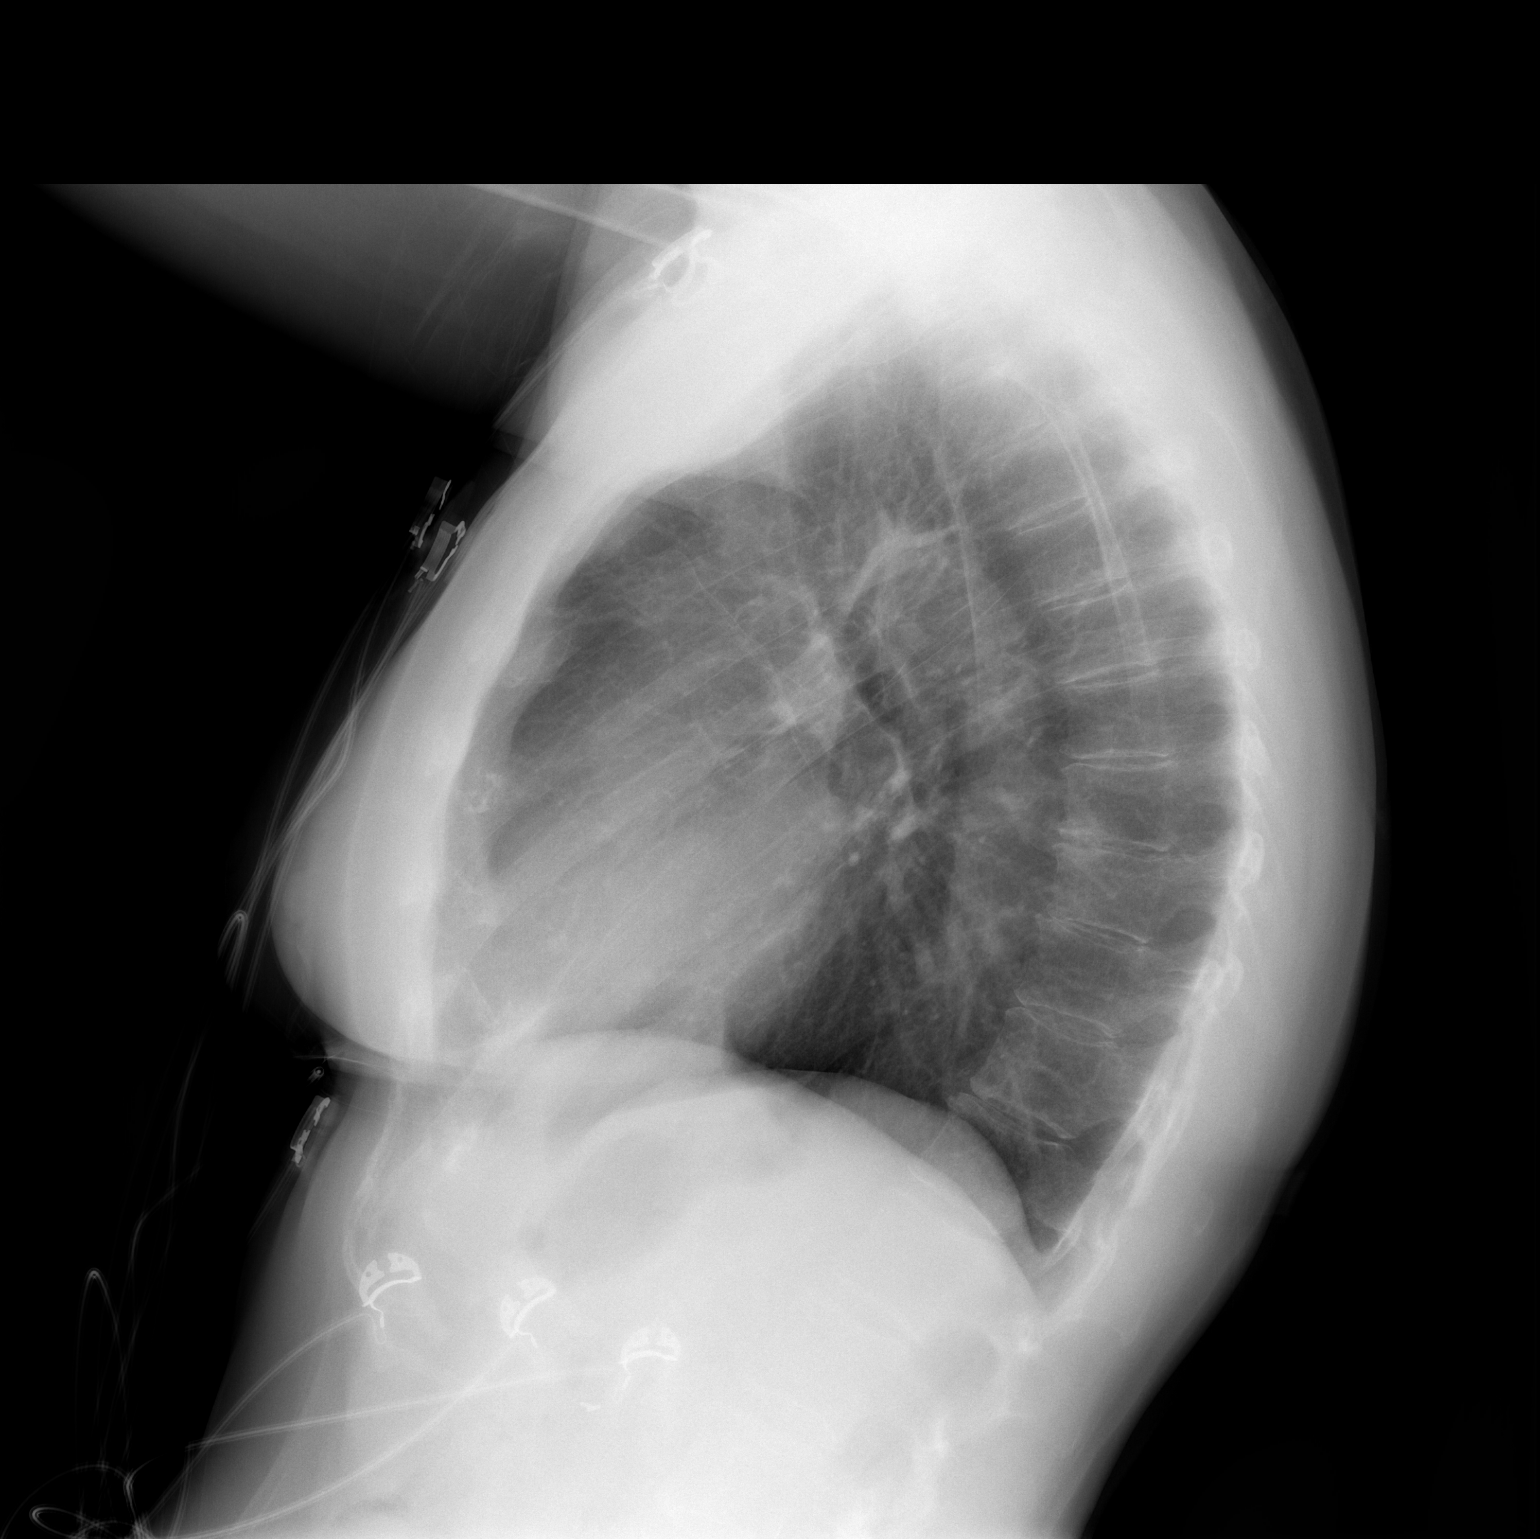

[2 of 2 positions shown; findings below may reference images not displayed]

FINDINGS: Normal sized heart. Clear lungs. The lungs are hyperexpanded. Mild
thoracic spine degenerative changes.
IMPRESSION: No acute abnormality.  Mild changes of COPD.

## 2018-05-25 DIAGNOSIS — I890 Lymphedema, not elsewhere classified: Secondary | ICD-10-CM | POA: Insufficient documentation

## 2018-05-31 ENCOUNTER — Other Ambulatory Visit: Payer: Medicare HMO

## 2018-06-02 IMAGING — DX DG FOOT COMPLETE 3+V*R*
3 series · 3 of 3 positions shown · non-contrast
Comparison: Radiographs dated 02/12/2018

CLINICAL DATA: Pain, redness, and swelling.

EXAM:
RIGHT FOOT COMPLETE - 3+ VIEW

[foot ap]
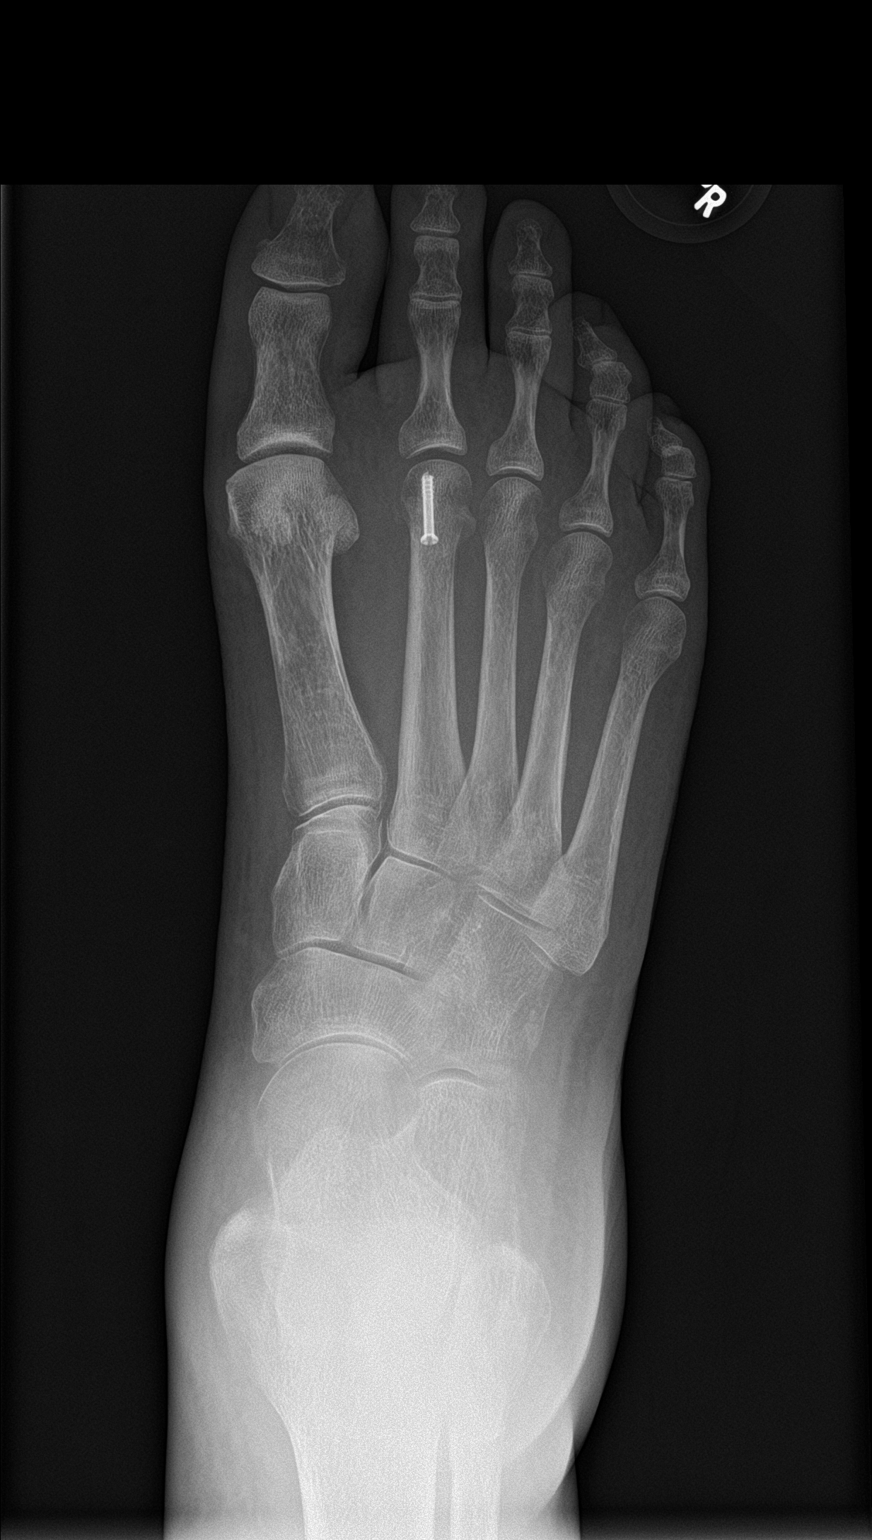

[foot obl]
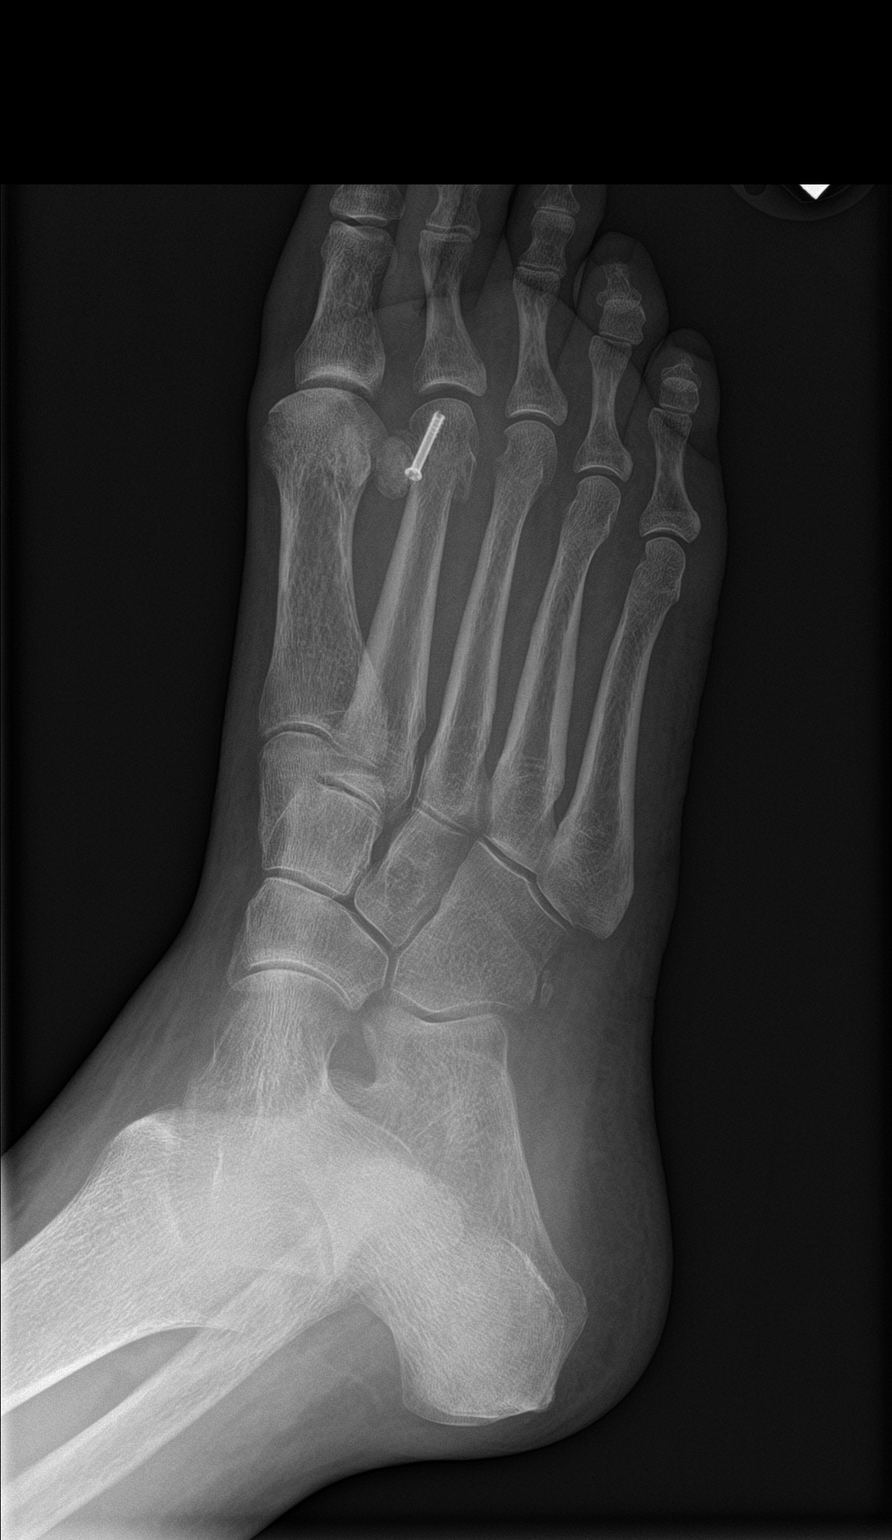

[foot lat]
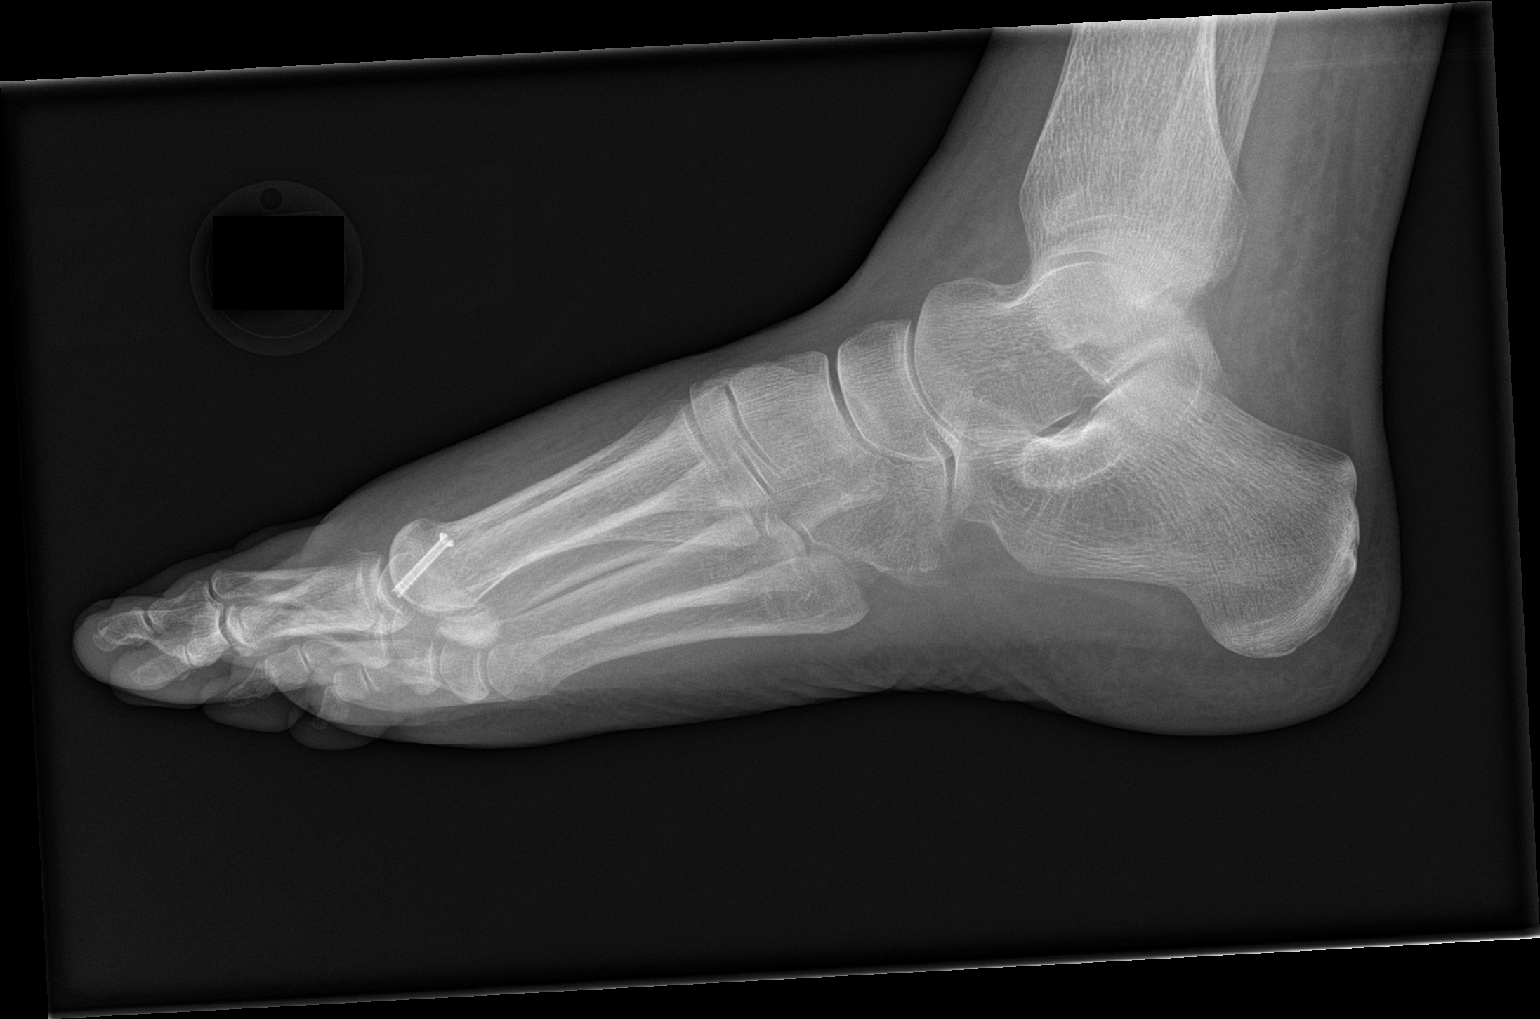

[3 of 3 positions shown; findings below may reference images not displayed]

FINDINGS: There is dorsal soft tissue swelling over the forefoot. Interval
placement of a screw in the head of the second metatarsal. Callus
formation. No visible fracture line.
IMPRESSION: Soft tissue swelling. Postsurgical changes in the distal second
metatarsal.

## 2018-06-05 ENCOUNTER — Telehealth: Payer: Self-pay | Admitting: *Deleted

## 2018-06-05 NOTE — Telephone Encounter (Signed)
Patient called in and asked for prednisolone because her leg is starting to swell. Patient states that she was given a rx for this when her leg was swollen before and the swelling went down but now it has returned. I advised patient that she would need an appointment. Appointment offered tomorrow in the afternoon but patient states her husband would not be able to bring her. Offered appt at 2pm July 8 which is next avail in [place] and patient states she could not come in then. Explained to patient that because Dr. Caffie Pinto  Is in [place] in the afternoon only we frontload the schedule and 2 is the next available time slot. Patient declined and states she will talk with her husband and call us back.

## 2018-06-12 ENCOUNTER — Ambulatory Visit (INDEPENDENT_AMBULATORY_CARE_PROVIDER_SITE_OTHER): Payer: Medicare HMO

## 2018-06-12 ENCOUNTER — Ambulatory Visit (INDEPENDENT_AMBULATORY_CARE_PROVIDER_SITE_OTHER): Payer: Medicare HMO | Admitting: Family Medicine

## 2018-06-12 ENCOUNTER — Encounter: Payer: Self-pay | Admitting: Family Medicine

## 2018-06-12 VITALS — BP 157/75 | HR 80 | Ht 66.0 in | Wt 133.0 lb

## 2018-06-12 DIAGNOSIS — G8918 Other acute postprocedural pain: Secondary | ICD-10-CM | POA: Diagnosis not present

## 2018-06-12 DIAGNOSIS — M79671 Pain in right foot: Secondary | ICD-10-CM

## 2018-06-12 DIAGNOSIS — M7989 Other specified soft tissue disorders: Secondary | ICD-10-CM | POA: Diagnosis not present

## 2018-06-12 DIAGNOSIS — Z9889 Other specified postprocedural states: Secondary | ICD-10-CM | POA: Diagnosis not present

## 2018-06-12 MED ORDER — DICLOFENAC SODIUM 1 % TD GEL: 2.0000 g | Freq: Four times a day (QID) | TRANSDERMAL | 11 refills | Status: DC
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
## 2018-06-12 NOTE — Patient Instructions (Signed)
Thank you for coming in today. Use diclofenac gel for pain as needed.  Use aspercream if you cannot get the diclofenac gel.  Apply 4x daily as needed.  Use the post op shoe for 2 weeks.  Continue compression.  Recheck in about 2 weeks.

## 2018-06-26 ENCOUNTER — Encounter: Payer: Self-pay | Admitting: Family Medicine

## 2018-06-26 ENCOUNTER — Ambulatory Visit (INDEPENDENT_AMBULATORY_CARE_PROVIDER_SITE_OTHER): Payer: Medicare HMO | Admitting: Family Medicine

## 2018-06-26 VITALS — BP 159/84 | HR 78 | Ht 66.0 in | Wt 136.0 lb

## 2018-06-26 DIAGNOSIS — M7989 Other specified soft tissue disorders: Secondary | ICD-10-CM

## 2018-06-26 DIAGNOSIS — I890 Lymphedema, not elsewhere classified: Secondary | ICD-10-CM | POA: Diagnosis not present
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
## 2018-06-26 NOTE — Patient Instructions (Addendum)
Thank you for coming in today. Attend PT.  If not benefiting let me know and I will order the vein reflux study.  Recheck in about 6 weeks return sooner if needed.    Lymphedema Lymphedema is swelling that is caused by the abnormal collection of lymph under the skin. Lymph is fluid from the tissues in your body that travels in the lymphatic system. This system is part of the immune system and includes lymph nodes and lymph vessels. The lymph vessels collect and carry the excess fluid, fats, proteins, and wastes from the tissues of the body to the bloodstream. This system also works to clean and remove bacteria and waste products from the body. Lymphedema occurs when the lymphatic system is blocked. When the lymph vessels or lymph nodes are blocked or damaged, lymph does not drain properly, causing an abnormal buildup of lymph. This leads to swelling in the arms or legs. Lymphedema cannot be cured by medicines, but various methods can be used to help reduce the swelling. What are the causes? There are two types of lymphedema. Primary lymphedema is caused by the absence or abnormality of the lymph vessel at birth. Secondary lymphedema is more common. It occurs when the lymph vessel is damaged or blocked. Common causes of lymph vessel blockage include:  Skin infection, such as cellulitis.  Infection by parasites (filariasis).  Injury.  Cancer.  Radiation therapy.  Formation of scar tissue.  Surgery.  What are the signs or symptoms? Symptoms of this condition include:  Swelling of the arm or leg.  A heavy or tight feeling in the arm or leg.  Swelling of the feet, toes, or fingers. Shoes or rings may fit more tightly than before.  Redness of the skin over the affected area.  Limited movement of the affected limb.  Sensitivity to touch or discomfort in the affected limb.  How is this diagnosed? This condition may be diagnosed with:  A physical exam.  Medical  history.  Bioimpedance spectroscopy. In this test, painless electrical currents are used to measure fluid levels in your body.  Imaging tests, such as: ? Lymphoscintigraphy. In this test, a low dose of a radioactive substance is injected to trace the flow of lymph through the lymph vessels. ? MRI. ? CT scan. ? Duplex ultrasound. This test uses sound waves to produce images of the vessels and the blood flow on a screen. ? Lymphangiography. In this test, a contrast dye is injected into the lymph vessel to help show blockages.  How is this treated? Treatment for this condition may depend on the cause. Treatment may include:  Exercise. Certain exercises can help fluid move out of the affected limb.  Massage. Gentle massage of the affected limb can help move the fluid out of the area.  Compression. Various methods may be used to apply pressure to the affected limb in order to reduce the swelling. ? Wearing compression stockings or sleeves on the affected limb. ? Bandaging the affected limb. ? Using an external pump that is attached to a sleeve that alternates between applying pressure and releasing pressure.  Surgery. This is usually only done for severe cases. For example, surgery may be done if you have trouble moving the limb or if the swelling does not get better with other treatments.  If an underlying condition is causing the lymphedema, treatment for that condition is needed. For example, antibiotic medicines may be used to treat an infection. Follow these instructions at home: Activities  Exercise regularly  as directed by your health care provider.  Do not sit with your legs crossed.  When possible, keep the affected limb raised (elevated) above the level of your heart.  Avoid carrying things with an arm that is affected by lymphedema.  Remember that the affected area is more likely to become injured or infected.  Take these steps to help prevent infection: ? Keep the  affected area clean and dry. ? Protect your skin from cuts. For example, you should use gloves while cooking or gardening. Do not walk barefoot. If you shave the affected area, use an Copy. General instructions  Take medicines only as directed by your health care provider.  Eat a healthy diet that includes a lot of fruits and vegetables.  Do not wear tight clothes, shoes, or jewelry.  Do not use heating pads over the affected area.  Avoid having blood pressure checked on the affected limb.  Keep all follow-up visits as directed by your health care provider. This is important. Contact a health care provider if:  You continue to have swelling in your limb.  You have a fever.  You have a cut that does not heal.  You have redness or pain in the affected area.  You have new swelling in your limb that comes on suddenly.  You develop purplish spots or sores (lesions) on your limb. Get help right away if:  You have a skin rash.  You have chills or sweats.  You have shortness of breath. This information is not intended to replace advice given to you by your health care provider. Make sure you discuss any questions you have with your health care provider. Document Released: 09/18/2007 Document Revised: 07/28/2016 Document Reviewed: 10/29/2014 Elsevier Interactive Patient Education  [employer].

## 2018-06-29 ENCOUNTER — Other Ambulatory Visit: Payer: Self-pay | Admitting: Family Medicine

## 2018-06-29 DIAGNOSIS — Z780 Asymptomatic menopausal state: Secondary | ICD-10-CM

## 2018-07-02 ENCOUNTER — Encounter: Payer: Self-pay | Admitting: Family Medicine

## 2018-07-02 DIAGNOSIS — M7989 Other specified soft tissue disorders: Secondary | ICD-10-CM

## 2018-07-02 DIAGNOSIS — I890 Lymphedema, not elsewhere classified: Secondary | ICD-10-CM

## 2018-07-11 ENCOUNTER — Encounter: Payer: Self-pay | Admitting: Family Medicine

## 2018-07-16 DIAGNOSIS — D2361 Other benign neoplasm of skin of right upper limb, including shoulder: Secondary | ICD-10-CM | POA: Diagnosis not present

## 2018-07-16 DIAGNOSIS — D2239 Melanocytic nevi of other parts of face: Secondary | ICD-10-CM | POA: Diagnosis not present

## 2018-07-16 DIAGNOSIS — Z08 Encounter for follow-up examination after completed treatment for malignant neoplasm: Secondary | ICD-10-CM | POA: Diagnosis not present

## 2018-07-16 DIAGNOSIS — Z85820 Personal history of malignant melanoma of skin: Secondary | ICD-10-CM | POA: Diagnosis not present

## 2018-07-19 ENCOUNTER — Ambulatory Visit (HOSPITAL_COMMUNITY)
Admission: RE | Admit: 2018-07-19 | Discharge: 2018-07-19 | Disposition: A | Discharge status: Home / Self Care | Payer: Medicare HMO | Source: Ambulatory Visit | Attending: Vascular Surgery | Admitting: Vascular Surgery

## 2018-07-19 ENCOUNTER — Encounter

## 2018-07-19 DIAGNOSIS — M7989 Other specified soft tissue disorders: Secondary | ICD-10-CM

## 2018-07-19 DIAGNOSIS — I890 Lymphedema, not elsewhere classified: Secondary | ICD-10-CM | POA: Diagnosis not present

## 2018-07-22 IMAGING — DX DG FOOT COMPLETE 3+V*R*
3 series · 3 of 3 positions shown · non-contrast
Comparison: 04/23/2018

CLINICAL DATA: Pain and swelling of the foot since surgery 5 weeks
ago.

EXAM:
RIGHT FOOT COMPLETE - 3+ VIEW

[foot ap]
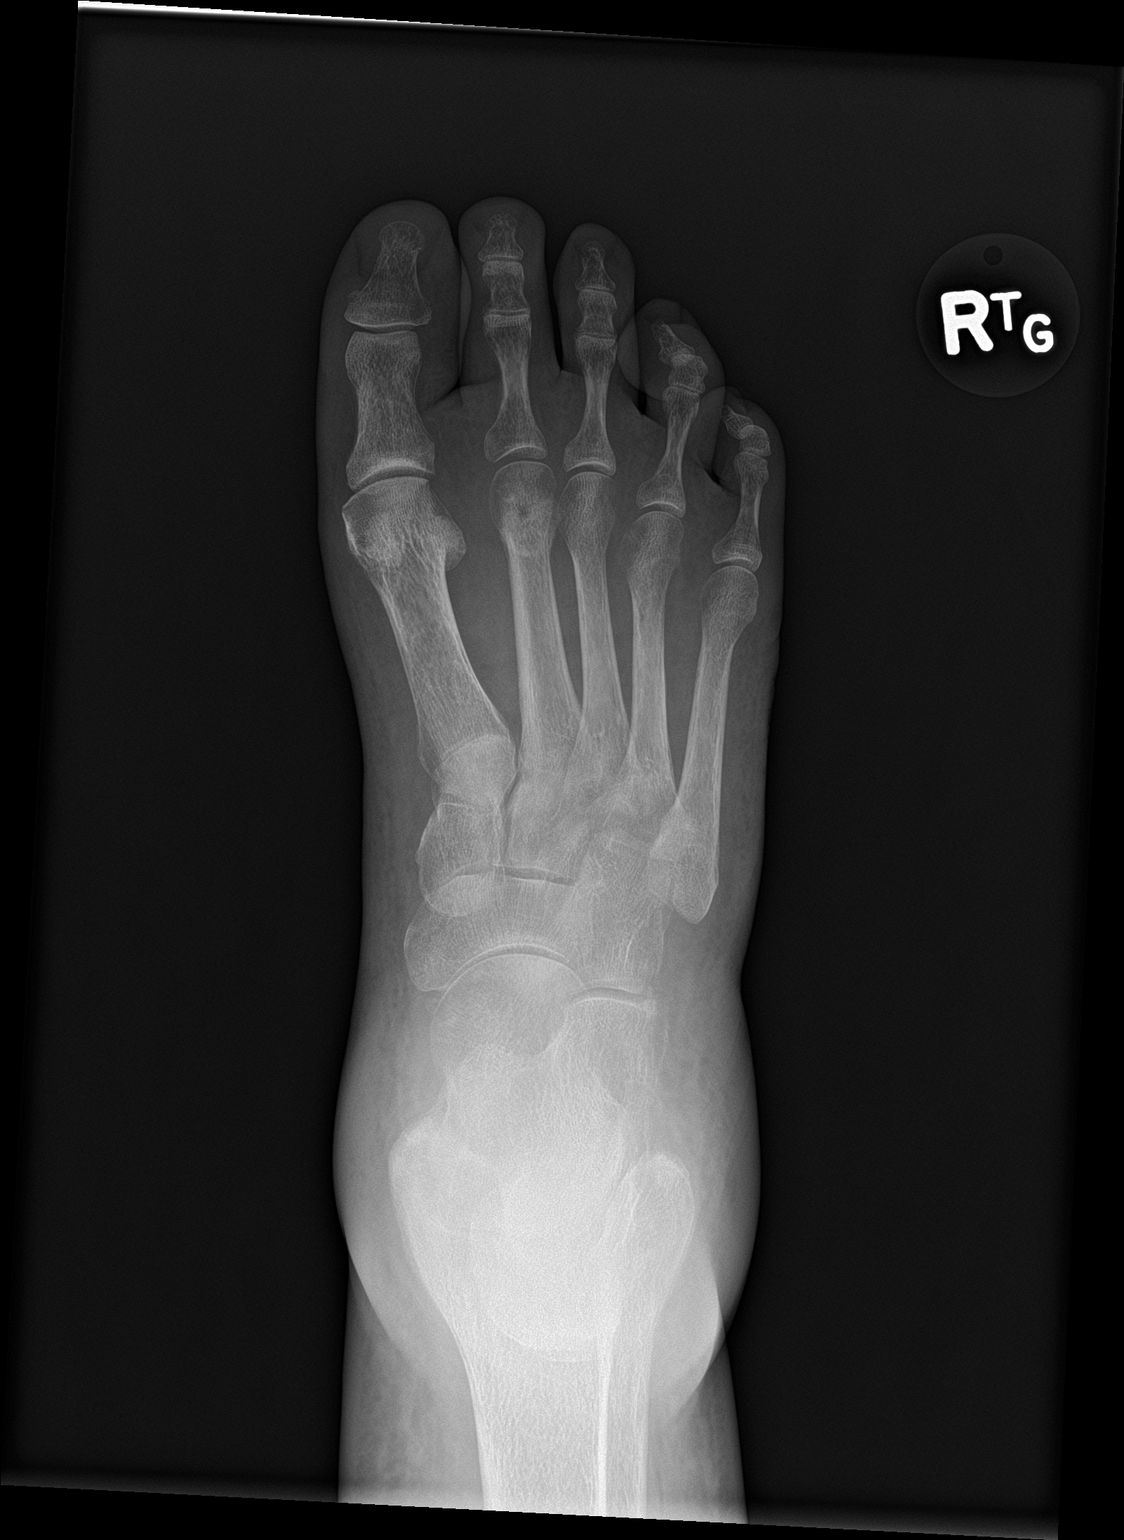

[foot obl]
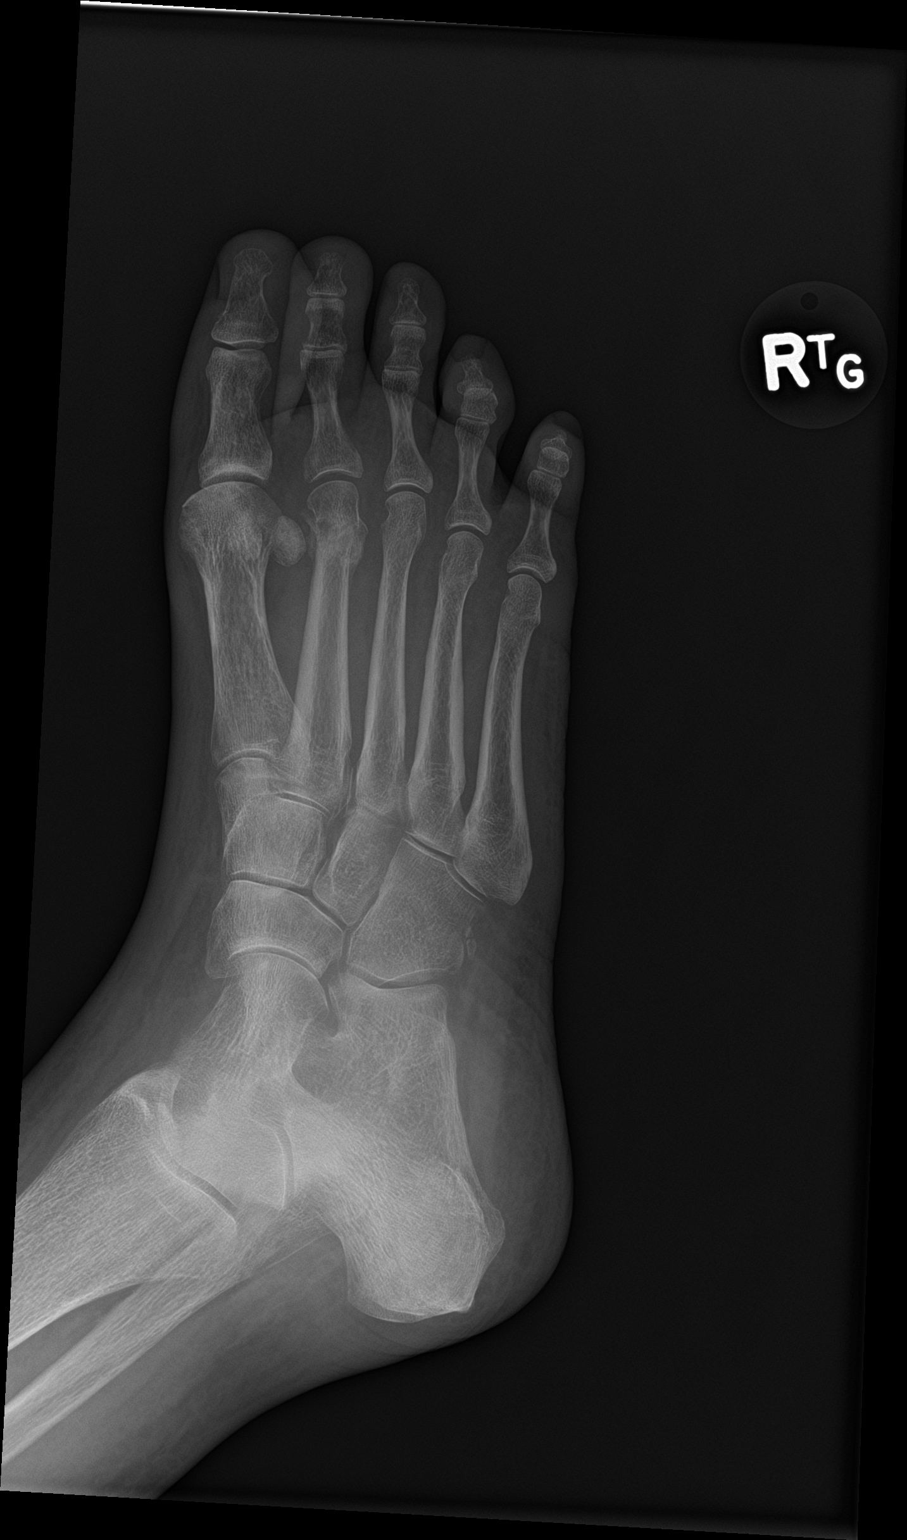

[foot lat]
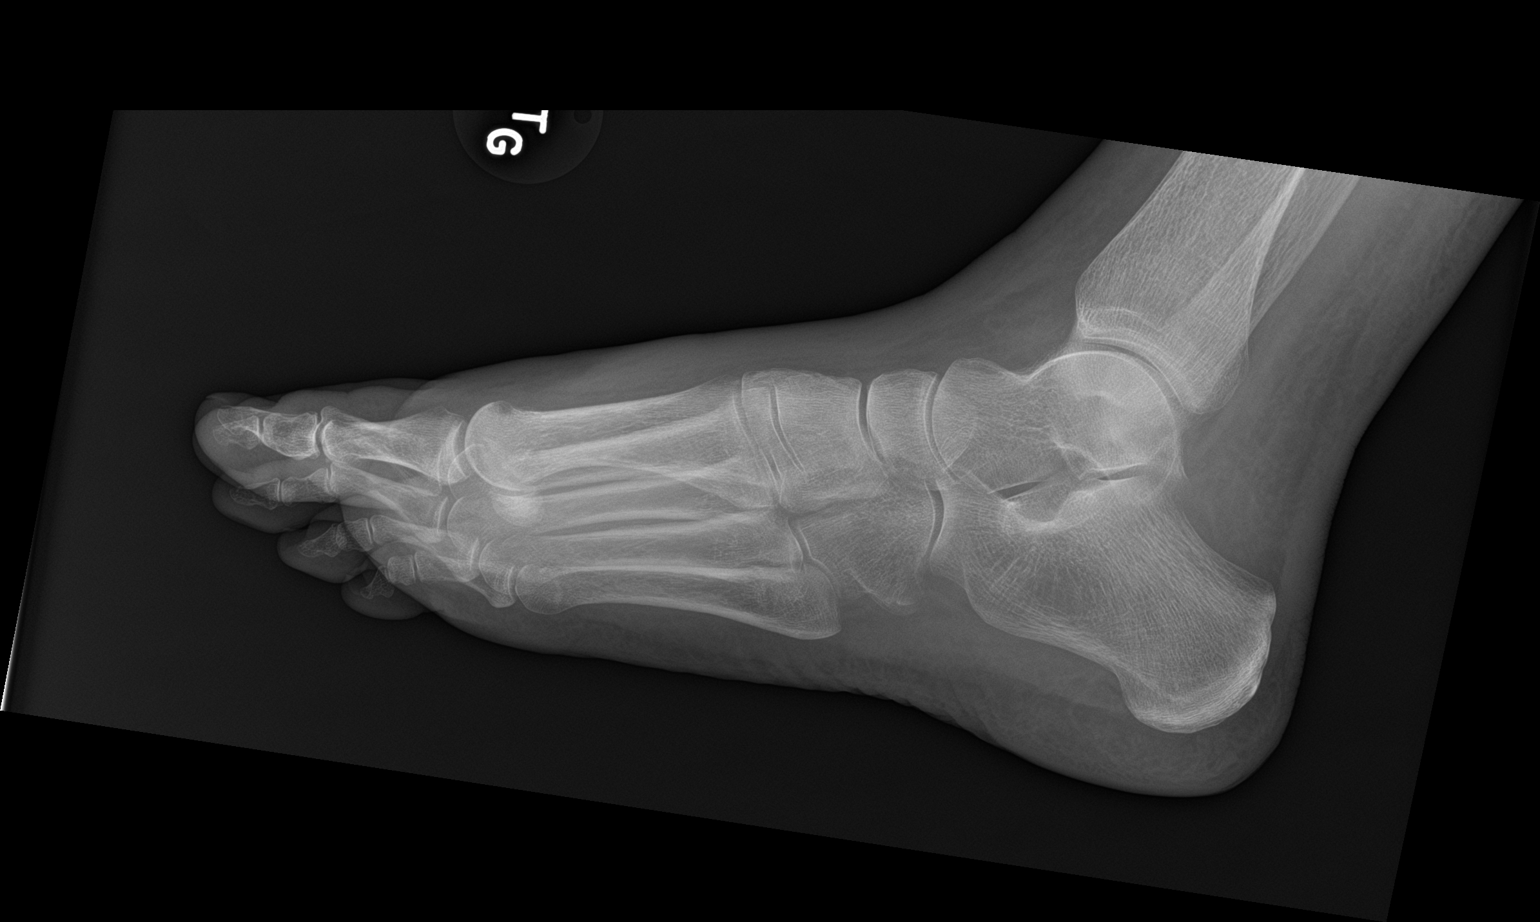

[3 of 3 positions shown; findings below may reference images not displayed]

FINDINGS: Previous screw at the head of the second metatarsal has been
removed. Healing/callus in that region appear slightly more
prominent. No destructive change. The other bones of the foot appear
normal.
IMPRESSION: Screw removal at the head of the metatarsal of the second toe.
Healing/callus in that region is slightly more prominent. No
destructive change or definite unfavorable finding.

## 2018-07-24 ENCOUNTER — Encounter: Payer: Self-pay | Admitting: Family Medicine

## 2018-07-25 ENCOUNTER — Encounter: Payer: Self-pay | Admitting: Family Medicine

## 2018-07-25 ENCOUNTER — Other Ambulatory Visit: Payer: Medicare HMO

## 2018-07-25 DIAGNOSIS — M7989 Other specified soft tissue disorders: Secondary | ICD-10-CM

## 2018-07-25 DIAGNOSIS — M79671 Pain in right foot: Secondary | ICD-10-CM

## 2018-07-25 DIAGNOSIS — I890 Lymphedema, not elsewhere classified: Secondary | ICD-10-CM

## 2018-08-01 ENCOUNTER — Other Ambulatory Visit: Payer: Medicare HMO

## 2018-08-01 IMAGING — US US EXTREM LOW VENOUS*R*
1 series · 13 of 24 positions shown · non-contrast
Comparison: None.

CLINICAL DATA: 61-year-old female with right lower leg swelling and
redness. Right ankle pain.



[Series 1: us extrem low venous*right* · 0.06mm/px · 13 of 32 slices shown]
[im 1/32]
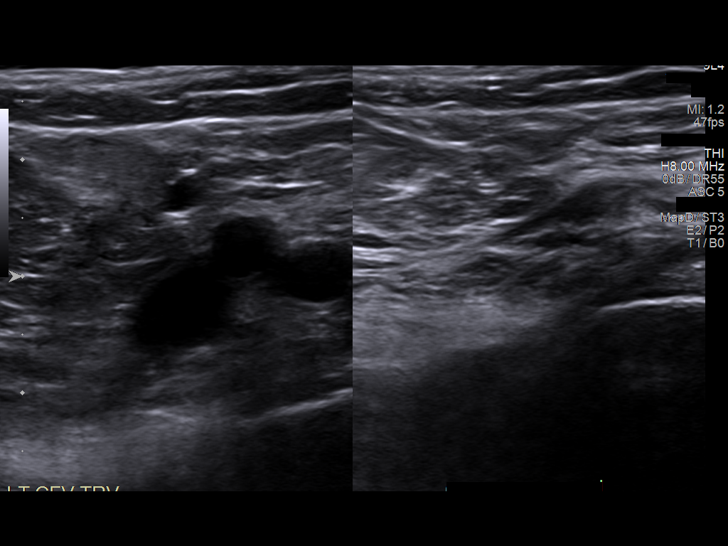
[im 3/32]
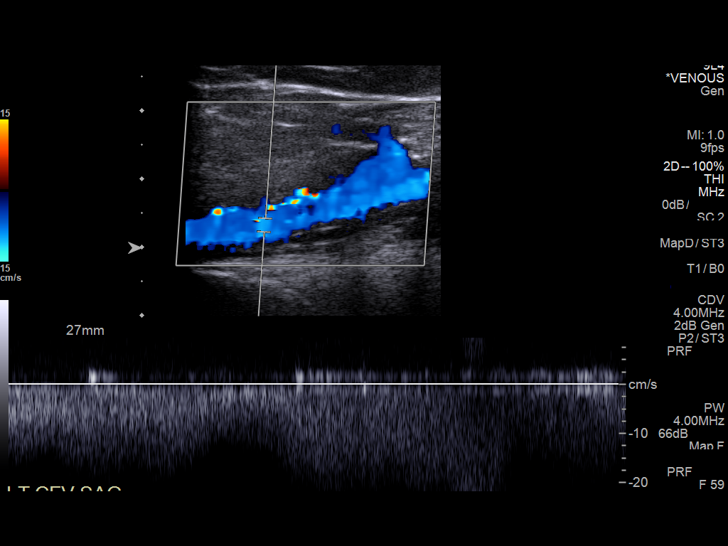
[im 6/32]
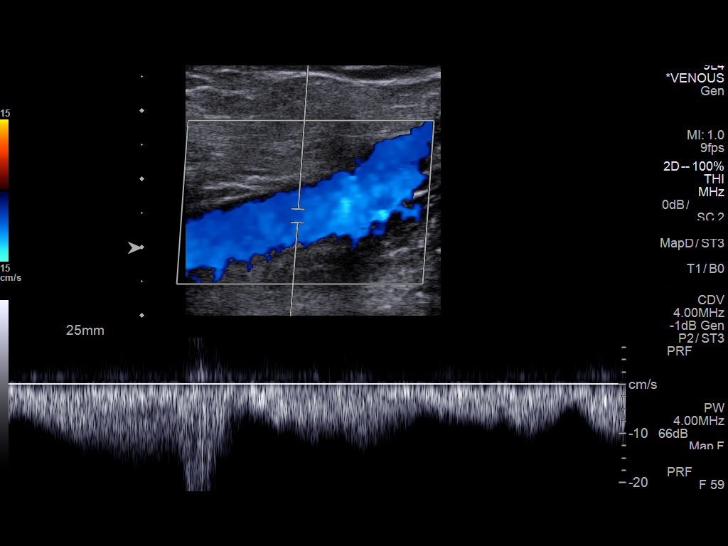
[im 9/32]
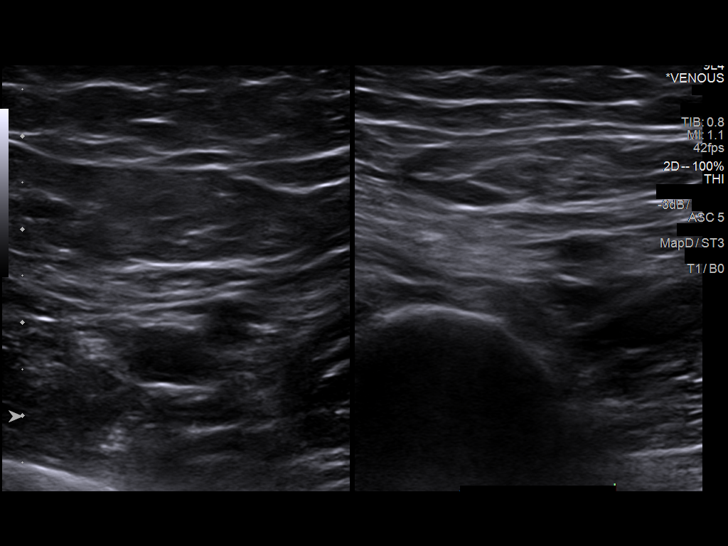
[im 11/32]
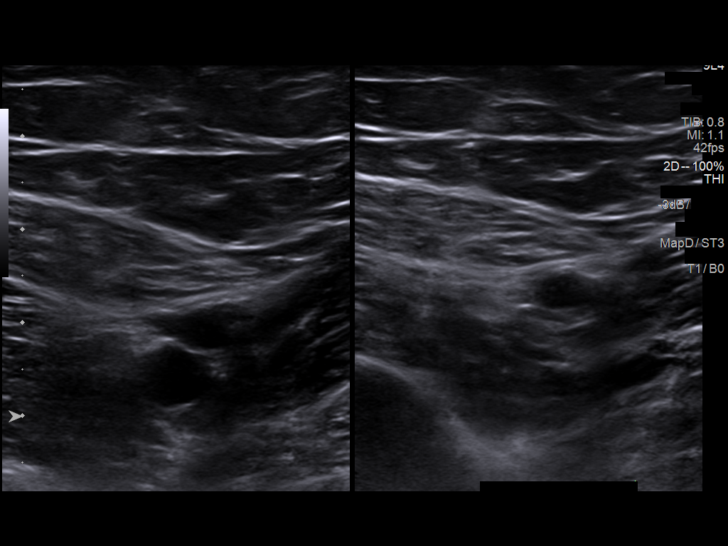
[im 14/32]
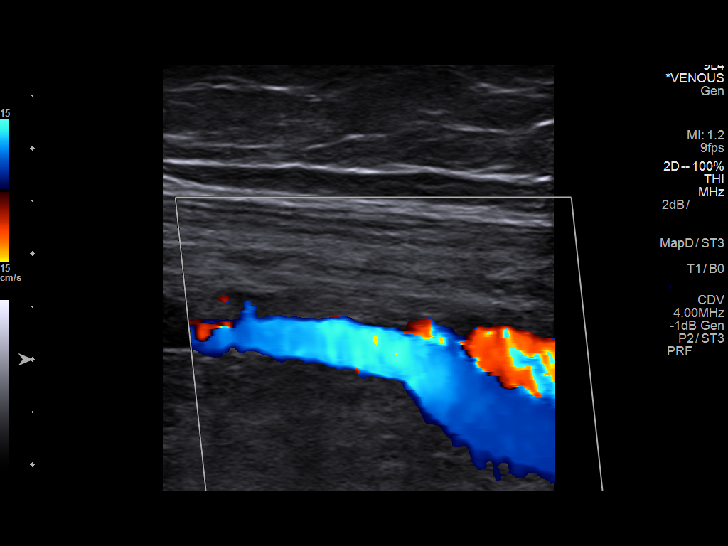
[im 17/32]
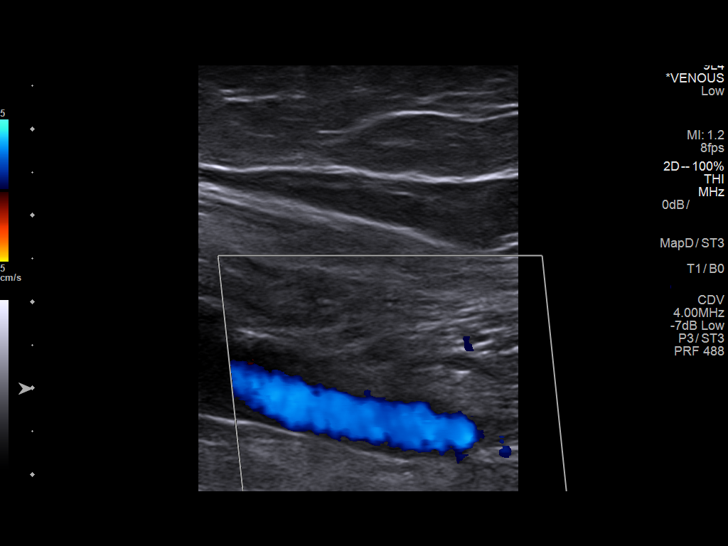
[im 18/32]
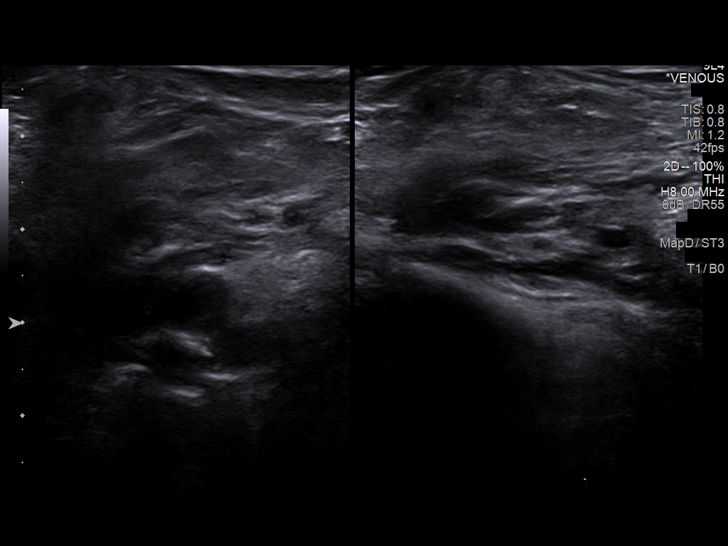
[im 21/32]
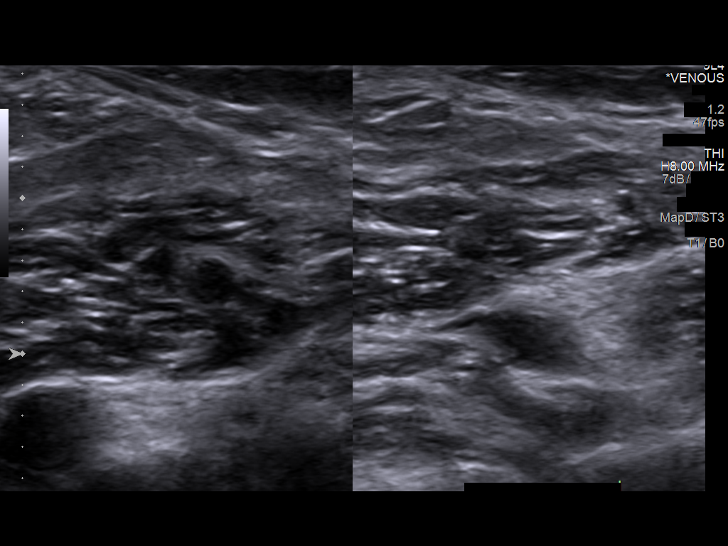
[im 23/32]
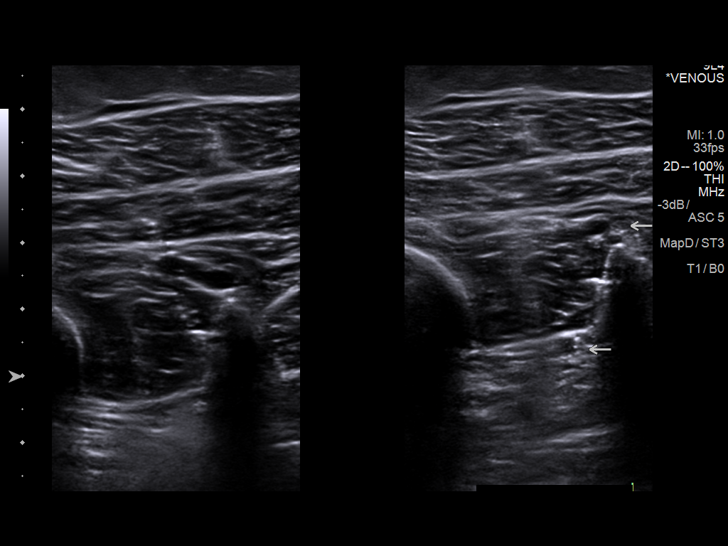
[im 26/32]
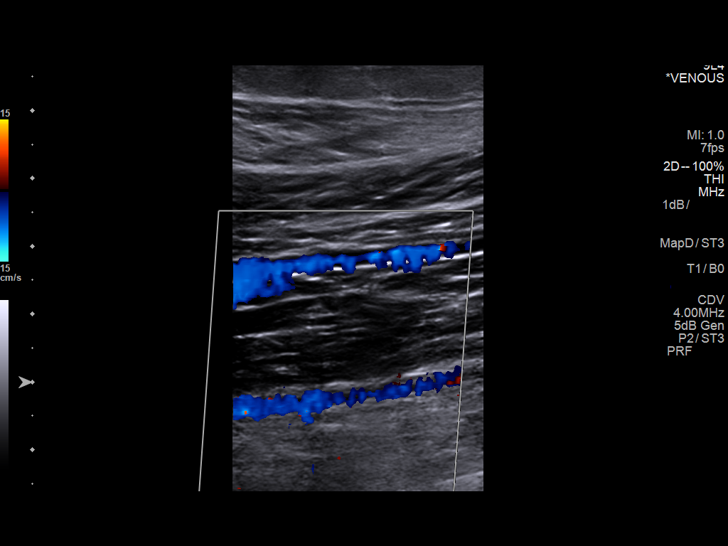
[im 29/32]
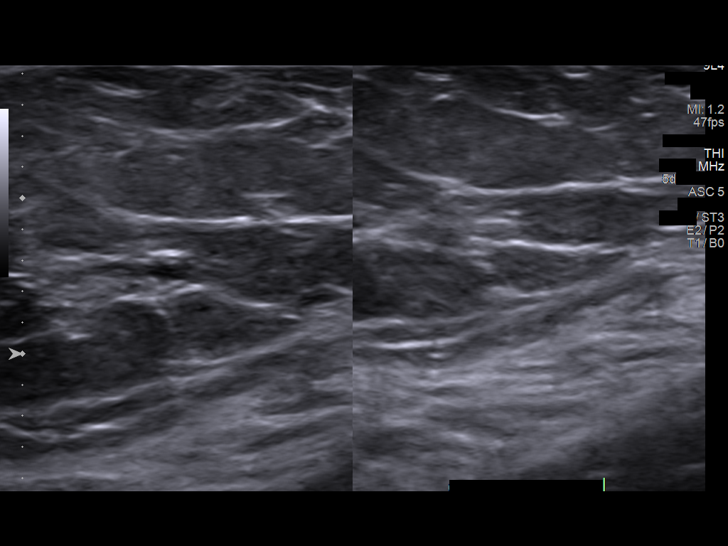
[im 32/32]
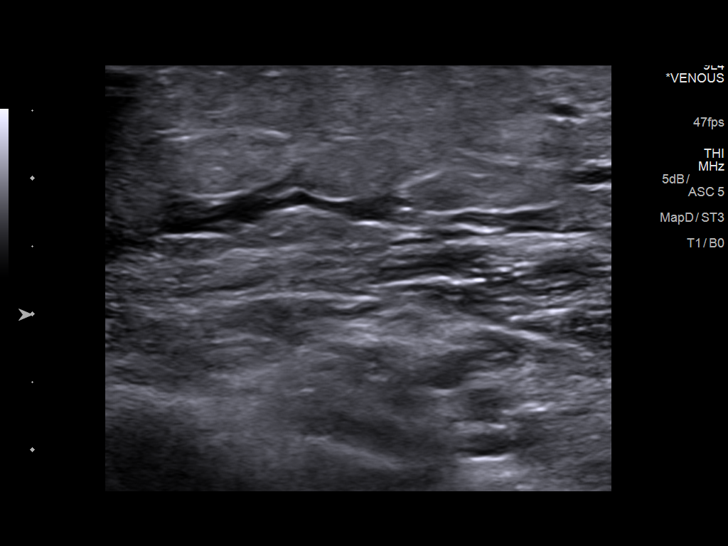

[13 of 24 positions shown; findings below may reference images not displayed]

FINDINGS: Contralateral Common Femoral Vein: Respiratory phasicity is normal
and symmetric with the symptomatic side. No evidence of thrombus.
Normal compressibility.

Common Femoral Vein: No evidence of thrombus. Normal
compressibility, respiratory phasicity and response to augmentation.

Saphenofemoral Junction: No evidence of thrombus. Normal
compressibility and flow on color Doppler imaging.

Profunda Femoral Vein: No evidence of thrombus. Normal
compressibility and flow on color Doppler imaging.

Femoral Vein: No evidence of thrombus. Normal compressibility,
respiratory phasicity and response to augmentation.

Popliteal Vein: No evidence of thrombus. Normal compressibility,
respiratory phasicity and response to augmentation.

Calf Veins: No evidence of thrombus. Normal compressibility and flow
on color Doppler imaging.

Superficial Great Saphenous Vein: No evidence of thrombus. Normal
compressibility.

Venous Reflux:  None.

Other Findings: Mild soft tissue edema in the distal calf and ankle.
IMPRESSION: No evidence of deep venous thrombosis.

## 2018-08-03 ENCOUNTER — Ambulatory Visit (INDEPENDENT_AMBULATORY_CARE_PROVIDER_SITE_OTHER): Payer: Medicare HMO | Admitting: Family Medicine

## 2018-08-03 ENCOUNTER — Telehealth: Payer: Self-pay | Admitting: Family Medicine

## 2018-08-03 ENCOUNTER — Encounter: Payer: Self-pay | Admitting: Family Medicine

## 2018-08-03 ENCOUNTER — Ambulatory Visit (INDEPENDENT_AMBULATORY_CARE_PROVIDER_SITE_OTHER): Payer: Medicare HMO

## 2018-08-03 VITALS — BP 150/78 | HR 83 | Ht 66.0 in | Wt 138.0 lb

## 2018-08-03 DIAGNOSIS — Z780 Asymptomatic menopausal state: Secondary | ICD-10-CM

## 2018-08-03 DIAGNOSIS — M797 Fibromyalgia: Secondary | ICD-10-CM | POA: Diagnosis not present

## 2018-08-03 DIAGNOSIS — G43109 Migraine with aura, not intractable, without status migrainosus: Secondary | ICD-10-CM | POA: Diagnosis not present

## 2018-08-03 DIAGNOSIS — S161XXA Strain of muscle, fascia and tendon at neck level, initial encounter: Secondary | ICD-10-CM

## 2018-08-03 DIAGNOSIS — M8588 Other specified disorders of bone density and structure, other site: Secondary | ICD-10-CM | POA: Diagnosis not present

## 2018-08-03 DIAGNOSIS — M810 Age-related osteoporosis without current pathological fracture: Secondary | ICD-10-CM | POA: Diagnosis not present

## 2018-08-03 DIAGNOSIS — I890 Lymphedema, not elsewhere classified: Secondary | ICD-10-CM | POA: Diagnosis not present

## 2018-08-03 MED ORDER — TRAMADOL HCL 50 MG PO TABS: 50.0000 mg | ORAL_TABLET | Freq: Four times a day (QID) | ORAL | 0 refills | Status: DC | PRN
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
## 2018-08-03 NOTE — Telephone Encounter (Signed)
Please call patient and let her know that her bone density T score was -2.7.  Back in 2017 it was -2.6.  Just encouraged her to make sure she is continuing with her vitamin D and either calcium intake in her diet or supplemental.  This will be in addition to weightbearing exercise to help strengthen her bones.  Since her T score is less than 2.5 the recommendation would also be for her to consider a bisphosphonate Fosamax or Boniva.

## 2018-08-07 ENCOUNTER — Ambulatory Visit: Payer: Medicare HMO | Admitting: Family Medicine

## 2018-08-09 NOTE — Telephone Encounter (Signed)
Left VM for Pt to return clinic call regarding results, callback information provided. 

## 2018-08-10 NOTE — Telephone Encounter (Signed)
Left detailed vm advising pt of recommendations and advised that she rtn call if she is ok w/starting medication.Breanna Casey, [place]

## 2018-08-13 ENCOUNTER — Telehealth: Payer: Self-pay

## 2018-08-13 NOTE — Telephone Encounter (Signed)
Agree with documentation as above.   Terion Hedman, MD  

## 2018-08-13 NOTE — Telephone Encounter (Signed)
[facility] (Kayla: [phone]) called clinic to advise about what the Pt's trend was last year when she was seen at their facility. Pt would refuse to do any treatment other than dry needling. They fear she will do the same this year, and that they won't really be helping her. They ask that if the trend begins this year and it was last year, for PCP to try and intervene so that Pt will participate in other methods to help improve her pain.   They also state she will only see therapist Monica at 5:15pm, so her first appointment isn't until 9/23. Even though they have appointments at early as this week.   I advised them to keep an open line of contact with our office, and if she is refusing treatment plans then we will try and speak with her regarding it.

## 2018-08-13 NOTE — Telephone Encounter (Signed)
[facility] called to say they will take the referral for [facility]. However, they are not sure if it will be beneficial for [facility] because she only wants dry needling. I said some patient can be a bit challenging. However, they should do what they can for the patient.

## 2018-08-13 NOTE — Telephone Encounter (Signed)
Your patient 

## 2018-08-27 DIAGNOSIS — R51 Headache: Secondary | ICD-10-CM | POA: Diagnosis not present

## 2018-08-27 DIAGNOSIS — M542 Cervicalgia: Secondary | ICD-10-CM | POA: Diagnosis not present

## 2018-08-27 DIAGNOSIS — M6281 Muscle weakness (generalized): Secondary | ICD-10-CM | POA: Diagnosis not present

## 2018-08-29 DIAGNOSIS — I890 Lymphedema, not elsewhere classified: Secondary | ICD-10-CM | POA: Diagnosis not present

## 2018-08-29 DIAGNOSIS — M79671 Pain in right foot: Secondary | ICD-10-CM | POA: Diagnosis not present

## 2018-08-29 DIAGNOSIS — R29898 Other symptoms and signs involving the musculoskeletal system: Secondary | ICD-10-CM | POA: Diagnosis not present

## 2018-08-30 ENCOUNTER — Encounter: Payer: Self-pay | Admitting: Family Medicine

## 2018-09-05 ENCOUNTER — Other Ambulatory Visit: Payer: Self-pay | Admitting: Family Medicine

## 2018-09-05 ENCOUNTER — Telehealth: Payer: Self-pay | Admitting: Family Medicine

## 2018-09-05 DIAGNOSIS — M6281 Muscle weakness (generalized): Secondary | ICD-10-CM | POA: Diagnosis not present

## 2018-09-05 DIAGNOSIS — G250 Essential tremor: Secondary | ICD-10-CM

## 2018-09-05 DIAGNOSIS — R740 Nonspecific elevation of levels of transaminase and lactic acid dehydrogenase [LDH]: Secondary | ICD-10-CM

## 2018-09-05 DIAGNOSIS — I890 Lymphedema, not elsewhere classified: Secondary | ICD-10-CM

## 2018-09-05 DIAGNOSIS — R030 Elevated blood-pressure reading, without diagnosis of hypertension: Secondary | ICD-10-CM

## 2018-09-05 DIAGNOSIS — R7401 Elevation of levels of liver transaminase levels: Secondary | ICD-10-CM

## 2018-09-05 DIAGNOSIS — M542 Cervicalgia: Secondary | ICD-10-CM | POA: Diagnosis not present

## 2018-09-05 DIAGNOSIS — M545 Low back pain: Secondary | ICD-10-CM | POA: Diagnosis not present

## 2018-09-05 DIAGNOSIS — R51 Headache: Secondary | ICD-10-CM | POA: Diagnosis not present

## 2018-09-05 NOTE — Telephone Encounter (Signed)
Left VM with status update. Will work on [place].

## 2018-09-05 NOTE — Progress Notes (Signed)
Lab ordered per radiology protocol.  

## 2018-09-05 NOTE — Telephone Encounter (Signed)
I spoke with physical therapist.  Plan for abdominal CT scan with contrast to evaluate new onset bilateral lymphedema.

## 2018-09-07 ENCOUNTER — Ambulatory Visit (INDEPENDENT_AMBULATORY_CARE_PROVIDER_SITE_OTHER): Payer: Medicare HMO | Admitting: Family Medicine

## 2018-09-07 ENCOUNTER — Encounter: Payer: Self-pay | Admitting: Family Medicine

## 2018-09-07 VITALS — BP 140/78 | HR 70 | Ht 66.0 in | Wt 134.0 lb

## 2018-09-07 DIAGNOSIS — R740 Nonspecific elevation of levels of transaminase and lactic acid dehydrogenase [LDH]: Secondary | ICD-10-CM | POA: Diagnosis not present

## 2018-09-07 DIAGNOSIS — R030 Elevated blood-pressure reading, without diagnosis of hypertension: Secondary | ICD-10-CM | POA: Diagnosis not present

## 2018-09-07 DIAGNOSIS — G250 Essential tremor: Secondary | ICD-10-CM | POA: Diagnosis not present

## 2018-09-07 DIAGNOSIS — Z0000 Encounter for general adult medical examination without abnormal findings: Secondary | ICD-10-CM | POA: Diagnosis not present

## 2018-09-07 MED ORDER — DIPHENHYDRAMINE HCL 25 MG PO TABS: 50.0000 mg | ORAL_TABLET | Freq: Once | ORAL | 0 refills | Status: DC

## 2018-09-07 MED ORDER — PREDNISONE 50 MG PO TABS: ORAL_TABLET | ORAL | 0 refills | Status: DC
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
## 2018-09-07 NOTE — Telephone Encounter (Signed)
Pt need premeds before CT scan for contrast allergy.  Prednisone 50mg  13,7,1 hour PO before scan Benadryl 50mg  1 hour PO before scan  Routing

## 2018-09-07 NOTE — Patient Instructions (Addendum)
Breanna Casey , Thank you for taking time to come for your Medicare Wellness Visit. I appreciate your ongoing commitment to your health goals. Please review the following plan we discussed and let me know if I can assist you in the future.   These are the goals we discussed: Goals    . Patient Stated     Keep up activity level.  Consider Shingles vaccine       This is a list of the screening recommended for you and due dates:  Health Maintenance  Topic Date Due  . Tetanus Vaccine  12/05/2018  . Mammogram  06/23/2019  . DEXA scan (bone density measurement)  08/03/2020  . Colon Cancer Screening  07/14/2021  .  Hepatitis C: One time screening is recommended by Center for Disease Control  (CDC) for  adults born from [birth year] through 1965.   Completed  . HIV Screening  Completed      Health Maintenance, Female Adopting a healthy lifestyle and getting preventive care can go a long way to promote health and wellness. Talk with your health care provider about what schedule of regular examinations is right for you. This is a good chance for you to check in with your provider about disease prevention and staying healthy. In between checkups, there are plenty of things you can do on your own. Experts have done a lot of research about which lifestyle changes and preventive measures are most likely to keep you healthy. Ask your health care provider for more information. Weight and diet Eat a healthy diet  Be sure to include plenty of vegetables, fruits, low-fat dairy products, and lean protein.  Do not eat a lot of foods high in solid fats, added sugars, or salt.  Get regular exercise. This is one of the most important things you can do for your health. ? Most adults should exercise for at least 150 minutes each week. The exercise should increase your heart rate and make you sweat (moderate-intensity exercise). ? Most adults should also do strengthening exercises at least twice a week. This  is in addition to the moderate-intensity exercise.  Maintain a healthy weight  Body mass index (BMI) is a measurement that can be used to identify possible weight problems. It estimates body fat based on height and weight. Your health care provider can help determine your BMI and help you achieve or maintain a healthy weight.  For females 94 years of age and older: ? A BMI below 18.5 is considered underweight. ? A BMI of 18.5 to 24.9 is normal. ? A BMI of 25 to 29.9 is considered overweight. ? A BMI of 30 and above is considered obese.  Watch levels of cholesterol and blood lipids  You should start having your blood tested for lipids and cholesterol at 62 years of age, then have this test every 5 years.  You may need to have your cholesterol levels checked more often if: ? Your lipid or cholesterol levels are high. ? You are older than 62 years of age. ? You are at high risk for heart disease.  Cancer screening Lung Cancer  Lung cancer screening is recommended for adults 24-23 years old who are at high risk for lung cancer because of a history of smoking.  A yearly low-dose CT scan of the lungs is recommended for people who: ? Currently smoke. ? Have quit within the past 15 years. ? Have at least a 30-pack-year history of smoking. A pack year is smoking an  average of one pack of cigarettes a day for 1 year.  Yearly screening should continue until it has been 15 years since you quit.  Yearly screening should stop if you develop a health problem that would prevent you from having lung cancer treatment.  Breast Cancer  Practice breast self-awareness. This means understanding how your breasts normally appear and feel.  It also means doing regular breast self-exams. Let your health care provider know about any changes, no matter how small.  If you are in your 20s or 30s, you should have a clinical breast exam (CBE) by a health care provider every 1-3 years as part of a regular  health exam.  If you are 31 or older, have a CBE every year. Also consider having a breast X-ray (mammogram) every year.  If you have a family history of breast cancer, talk to your health care provider about genetic screening.  If you are at high risk for breast cancer, talk to your health care provider about having an MRI and a mammogram every year.  Breast cancer gene (BRCA) assessment is recommended for women who have family members with BRCA-related cancers. BRCA-related cancers include: ? Breast. ? Ovarian. ? Tubal. ? Peritoneal cancers.  Results of the assessment will determine the need for genetic counseling and BRCA1 and BRCA2 testing.  Cervical Cancer Your health care provider may recommend that you be screened regularly for cancer of the pelvic organs (ovaries, uterus, and vagina). This screening involves a pelvic examination, including checking for microscopic changes to the surface of your cervix (Pap test). You may be encouraged to have this screening done every 3 years, beginning at age 56.  For women ages 83-65, health care providers may recommend pelvic exams and Pap testing every 3 years, or they may recommend the Pap and pelvic exam, combined with testing for human papilloma virus (HPV), every 5 years. Some types of HPV increase your risk of cervical cancer. Testing for HPV may also be done on women of any age with unclear Pap test results.  Other health care providers may not recommend any screening for nonpregnant women who are considered low risk for pelvic cancer and who do not have symptoms. Ask your health care provider if a screening pelvic exam is right for you.  If you have had past treatment for cervical cancer or a condition that could lead to cancer, you need Pap tests and screening for cancer for at least 20 years after your treatment. If Pap tests have been discontinued, your risk factors (such as having a new sexual partner) need to be reassessed to determine  if screening should resume. Some women have medical problems that increase the chance of getting cervical cancer. In these cases, your health care provider may recommend more frequent screening and Pap tests.  Colorectal Cancer  This type of cancer can be detected and often prevented.  Routine colorectal cancer screening usually begins at 62 years of age and continues through 62 years of age.  Your health care provider may recommend screening at an earlier age if you have risk factors for colon cancer.  Your health care provider may also recommend using home test kits to check for hidden blood in the stool.  A small camera at the end of a tube can be used to examine your colon directly (sigmoidoscopy or colonoscopy). This is done to check for the earliest forms of colorectal cancer.  Routine screening usually begins at age 46.  Direct examination of the  colon should be repeated every 5-10 years through 62 years of age. However, you may need to be screened more often if early forms of precancerous polyps or small growths are found.  Skin Cancer  Check your skin from head to toe regularly.  Tell your health care provider about any new moles or changes in moles, especially if there is a change in a mole's shape or color.  Also tell your health care provider if you have a mole that is larger than the size of a pencil eraser.  Always use sunscreen. Apply sunscreen liberally and repeatedly throughout the day.  Protect yourself by wearing long sleeves, pants, a wide-brimmed hat, and sunglasses whenever you are outside.  Heart disease, diabetes, and high blood pressure  High blood pressure causes heart disease and increases the risk of stroke. High blood pressure is more likely to develop in: ? People who have blood pressure in the high end of the normal range (130-139/85-89 mm Hg). ? People who are overweight or obese. ? People who are African American.  If you are 84-35 years of age,  have your blood pressure checked every 3-5 years. If you are 86 years of age or older, have your blood pressure checked every year. You should have your blood pressure measured twice-once when you are at a hospital or clinic, and once when you are not at a hospital or clinic. Record the average of the two measurements. To check your blood pressure when you are not at a hospital or clinic, you can use: ? An automated blood pressure machine at a pharmacy. ? A home blood pressure monitor.  If you are between 21 years and 12 years old, ask your health care provider if you should take aspirin to prevent strokes.  Have regular diabetes screenings. This involves taking a blood sample to check your fasting blood sugar level. ? If you are at a normal weight and have a low risk for diabetes, have this test once every three years after 62 years of age. ? If you are overweight and have a high risk for diabetes, consider being tested at a younger age or more often. Preventing infection Hepatitis B  If you have a higher risk for hepatitis B, you should be screened for this virus. You are considered at high risk for hepatitis B if: ? You were born in a country where hepatitis B is common. Ask your health care provider which countries are considered high risk. ? Your parents were born in a high-risk country, and you have not been immunized against hepatitis B (hepatitis B vaccine). ? You have HIV or AIDS. ? You use needles to inject street drugs. ? You live with someone who has hepatitis B. ? You have had sex with someone who has hepatitis B. ? You get hemodialysis treatment. ? You take certain medicines for conditions, including cancer, organ transplantation, and autoimmune conditions.  Hepatitis C  Blood testing is recommended for: ? Everyone born from [birth year] through 1965. ? Anyone with known risk factors for hepatitis C.  Sexually transmitted infections (STIs)  You should be screened for sexually  transmitted infections (STIs) including gonorrhea and chlamydia if: ? You are sexually active and are younger than 62 years of age. ? You are older than 62 years of age and your health care provider tells you that you are at risk for this type of infection. ? Your sexual activity has changed since you were last screened and you are at an  increased risk for chlamydia or gonorrhea. Ask your health care provider if you are at risk.  If you do not have HIV, but are at risk, it may be recommended that you take a prescription medicine daily to prevent HIV infection. This is called pre-exposure prophylaxis (PrEP). You are considered at risk if: ? You are sexually active and do not regularly use condoms or know the HIV status of your partner(s). ? You take drugs by injection. ? You are sexually active with a partner who has HIV.  Talk with your health care provider about whether you are at high risk of being infected with HIV. If you choose to begin PrEP, you should first be tested for HIV. You should then be tested every 3 months for as long as you are taking PrEP. Pregnancy  If you are premenopausal and you may become pregnant, ask your health care provider about preconception counseling.  If you may become pregnant, take 400 to 800 micrograms (mcg) of folic acid every day.  If you want to prevent pregnancy, talk to your health care provider about birth control (contraception). Osteoporosis and menopause  Osteoporosis is a disease in which the bones lose minerals and strength with aging. This can result in serious bone fractures. Your risk for osteoporosis can be identified using a bone density scan.  If you are 59 years of age or older, or if you are at risk for osteoporosis and fractures, ask your health care provider if you should be screened.  Ask your health care provider whether you should take a calcium or vitamin D supplement to lower your risk for osteoporosis.  Menopause may have  certain physical symptoms and risks.  Hormone replacement therapy may reduce some of these symptoms and risks. Talk to your health care provider about whether hormone replacement therapy is right for you. Follow these instructions at home:  Schedule regular health, dental, and eye exams.  Stay current with your immunizations.  Do not use any tobacco products including cigarettes, chewing tobacco, or electronic cigarettes.  If you are pregnant, do not drink alcohol.  If you are breastfeeding, limit how much and how often you drink alcohol.  Limit alcohol intake to no more than 1 drink per day for nonpregnant women. One drink equals 12 ounces of beer, 5 ounces of wine, or 1 ounces of hard liquor.  Do not use street drugs.  Do not share needles.  Ask your health care provider for help if you need support or information about quitting drugs.  Tell your health care provider if you often feel depressed.  Tell your health care provider if you have ever been abused or do not feel safe at home. This information is not intended to replace advice given to you by your health care provider. Make sure you discuss any questions you have with your health care provider. Document Released: 06/06/2011 Document Revised: 04/28/2016 Document Reviewed: 08/25/2015 Elsevier Interactive Patient Education  [employer].

## 2018-09-08 LAB — COMPREHENSIVE METABOLIC PANEL
AG Ratio: 2 (calc) (ref 1.0–2.5)
ALT: 18 U/L (ref 6–29)
AST: 24 U/L (ref 10–35)
Albumin: 4.7 g/dL (ref 3.6–5.1)
Alkaline phosphatase (APISO): 75 U/L (ref 33–130)
BUN/Creatinine Ratio: 6 (calc) (ref 6–22)
BUN: 5 mg/dL — ABNORMAL LOW (ref 7–25)
CO2: 28 mmol/L (ref 20–32)
Calcium: 10 mg/dL (ref 8.6–10.4)
Chloride: 104 mmol/L (ref 98–110)
Creat: 0.78 mg/dL (ref 0.50–0.99)
Globulin: 2.3 g/dL (calc) (ref 1.9–3.7)
Glucose, Bld: 80 mg/dL (ref 65–99)
Potassium: 4.6 mmol/L (ref 3.5–5.3)
Sodium: 139 mmol/L (ref 135–146)
Total Bilirubin: 0.5 mg/dL (ref 0.2–1.2)
Total Protein: 7 g/dL (ref 6.1–8.1)

## 2018-09-08 LAB — SPECIMEN COMPROMISED

## 2018-09-09 NOTE — Progress Notes (Signed)
All labs are normal. 

## 2018-09-10 MED ORDER — PREDNISONE 50 MG PO TABS: ORAL_TABLET | ORAL | 0 refills | Status: DC

## 2018-09-10 MED ORDER — DIPHENHYDRAMINE HCL 25 MG PO TABS: 50.0000 mg | ORAL_TABLET | Freq: Once | ORAL | 0 refills | Status: DC

## 2018-09-10 NOTE — Telephone Encounter (Signed)
Premedication ordered

## 2018-09-10 NOTE — Telephone Encounter (Signed)
Pt advised.

## 2018-09-11 ENCOUNTER — Encounter (HOSPITAL_BASED_OUTPATIENT_CLINIC_OR_DEPARTMENT_OTHER): Payer: Self-pay

## 2018-09-11 ENCOUNTER — Ambulatory Visit (HOSPITAL_BASED_OUTPATIENT_CLINIC_OR_DEPARTMENT_OTHER)
Admission: RE | Admit: 2018-09-11 | Discharge: 2018-09-11 | Disposition: A | Discharge status: Home / Self Care | Payer: Medicare HMO | Source: Ambulatory Visit | Attending: Family Medicine | Admitting: Family Medicine

## 2018-09-11 DIAGNOSIS — I890 Lymphedema, not elsewhere classified: Secondary | ICD-10-CM | POA: Insufficient documentation

## 2018-09-11 DIAGNOSIS — R1031 Right lower quadrant pain: Secondary | ICD-10-CM | POA: Diagnosis not present

## 2018-09-11 MED ORDER — IOPAMIDOL (ISOVUE-300) INJECTION 61%
100.0000 mL | Freq: Once | INTRAVENOUS | Status: AC | PRN
  Administered 2018-09-11: 100 mL via INTRAVENOUS

## 2018-09-11 MED ORDER — DIPHENHYDRAMINE HCL 50 MG PO CAPS
50.0000 mg | ORAL_CAPSULE | Freq: Once | ORAL | Status: DC
  Filled 2018-09-11: qty 1

## 2018-09-11 MED ORDER — PREDNISONE 50 MG PO TABS
50.0000 mg | ORAL_TABLET | Freq: Four times a day (QID) | ORAL | Status: DC
  Filled 2018-09-11: qty 1

## 2018-09-11 MED ORDER — DIPHENHYDRAMINE HCL 50 MG/ML IJ SOLN
50.0000 mg | Freq: Once | INTRAMUSCULAR | Status: DC
  Filled 2018-09-11: qty 1

## 2018-09-12 ENCOUNTER — Encounter: Payer: Self-pay | Admitting: Family Medicine

## 2018-09-12 DIAGNOSIS — M542 Cervicalgia: Secondary | ICD-10-CM | POA: Diagnosis not present

## 2018-09-12 DIAGNOSIS — M6281 Muscle weakness (generalized): Secondary | ICD-10-CM | POA: Diagnosis not present

## 2018-09-12 DIAGNOSIS — R51 Headache: Secondary | ICD-10-CM | POA: Diagnosis not present

## 2018-09-19 DIAGNOSIS — R51 Headache: Secondary | ICD-10-CM | POA: Diagnosis not present

## 2018-09-19 DIAGNOSIS — M6281 Muscle weakness (generalized): Secondary | ICD-10-CM | POA: Diagnosis not present

## 2018-09-19 DIAGNOSIS — M542 Cervicalgia: Secondary | ICD-10-CM | POA: Diagnosis not present

## 2018-09-20 ENCOUNTER — Ambulatory Visit: Payer: Medicare HMO | Admitting: Family Medicine

## 2018-09-25 DIAGNOSIS — M542 Cervicalgia: Secondary | ICD-10-CM | POA: Diagnosis not present

## 2018-09-25 DIAGNOSIS — M6281 Muscle weakness (generalized): Secondary | ICD-10-CM | POA: Diagnosis not present

## 2018-09-25 DIAGNOSIS — R51 Headache: Secondary | ICD-10-CM | POA: Diagnosis not present

## 2018-09-27 ENCOUNTER — Encounter: Payer: Self-pay | Admitting: Family Medicine

## 2018-09-27 ENCOUNTER — Ambulatory Visit (INDEPENDENT_AMBULATORY_CARE_PROVIDER_SITE_OTHER): Payer: Medicare HMO

## 2018-09-27 ENCOUNTER — Ambulatory Visit (INDEPENDENT_AMBULATORY_CARE_PROVIDER_SITE_OTHER): Payer: Medicare HMO | Admitting: Family Medicine

## 2018-09-27 VITALS — BP 149/75 | HR 76 | Ht 65.0 in | Wt 134.0 lb

## 2018-09-27 DIAGNOSIS — R2689 Other abnormalities of gait and mobility: Secondary | ICD-10-CM | POA: Diagnosis not present

## 2018-09-27 DIAGNOSIS — M76892 Other specified enthesopathies of left lower limb, excluding foot: Secondary | ICD-10-CM

## 2018-09-27 DIAGNOSIS — M25552 Pain in left hip: Secondary | ICD-10-CM

## 2018-09-27 DIAGNOSIS — S76012A Strain of muscle, fascia and tendon of left hip, initial encounter: Secondary | ICD-10-CM

## 2018-09-27 MED ORDER — AMBULATORY NON FORMULARY MEDICATION: 0 refills | Status: DC
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
## 2018-09-27 NOTE — Patient Instructions (Addendum)
Thank you for coming in today. Attend PT for hip pain.  Get a walker  Consider [facility] supply Address: [address], [place], [place] [address] Phone: [phone]  Recheck with me in 2 weeks.   Trochanteric Bursitis Trochanteric bursitis is a condition that causes hip pain. Trochanteric bursitis happens when fluid-filled sacs (bursae) in the hip get irritated. Normally these sacs absorb shock and help strong bands of tissue (tendons) in your hip glide smoothly over each other and over your hip bones. What are the causes? This condition results from increased friction between the hip bones and the tendons that go over them. This condition can happen if you:  Have weak hips.  Use your hip muscles too much (overuse).  Get hit in the hip.  What increases the risk? This condition is more likely to develop in:  Women.  Adults who are middle-aged or older.  People with arthritis or a spinal condition.  People with weak buttocks muscles (gluteal muscles).  People who have one leg that is shorter than the other.  People who participate in certain kinds of athletic activities, such as: ? Running sports, especially long-distance running. ? Contact sports, like football or martial arts. ? Sports in which falls may occur, like skiing.  What are the signs or symptoms? The main symptom of this condition is pain and tenderness over the point of your hip. The pain may be:  Sharp and intense.  Dull and achy.  Felt on the outside of your thigh.  It may increase when you:  Lie on your side.  Walk or run.  Go up on stairs.  Sit.  Stand up after sitting.  Stand for long periods of time.  How is this diagnosed? This condition may be diagnosed based on:  Your symptoms.  Your medical history.  A physical exam.  Imaging tests, such as: ? X-rays to check your bones. ? An MRI or ultrasound to check your tendons and muscles.  During your physical exam, your health  care provider will check the movement and strength of your hip. He or she may press on the point of your hip to check for pain. How is this treated? This condition may be treated by:  Resting.  Reducing your activity.  Avoiding activities that cause pain.  Using crutches, a cane, or a walker to decrease the strain on your hip.  Taking medicine to help with swelling.  Having medicine injected into the bursae to help with swelling.  Using ice, heat, and massage therapy for pain relief.  Physical therapy exercises for strength and flexibility.  Surgery (rare).  Follow these instructions at home: Activity  Rest.  Avoid activities that cause pain.  Return to your normal activities as told by your health care provider. Ask your health care provider what activities are safe for you. Managing pain, stiffness, and swelling  Take over-the-counter and prescription medicines only as told by your health care provider.  If directed, apply heat to the injured area as told by your health care provider. ? Place a towel between your skin and the heat source. ? Leave the heat on for 20-30 minutes. ? Remove the heat if your skin turns bright red. This is especially important if you are unable to feel pain, heat, or cold. You may have a greater risk of getting burned.  If directed, apply ice to the injured area: ? Put ice in a plastic bag. ? Place a towel between your skin  and the bag. ? Leave the ice on for 20 minutes, 2-3 times a day. General instructions  If the affected leg is one that you use for driving, ask your health care provider when it is safe to drive.  Use crutches, a cane, or a walker as told by your health care provider.  If one of your legs is shorter than the other, get fitted for a shoe insert.  Lose weight if you are overweight. How is this prevented?  Wear supportive footwear that is appropriate for your sport.  If you have hip pain, start any new exercise or  sport slowly.  Maintain physical fitness, including: ? Strength. ? Flexibility. Contact a health care provider if:  Your pain does not improve with 2-4 weeks. Get help right away if:  You develop severe pain.  You have a fever.  You develop increased redness over your hip.  You have a change in your bowel function or bladder function.  You cannot control the muscles in your feet. This information is not intended to replace advice given to you by your health care provider. Make sure you discuss any questions you have with your health care provider. Document Released: 12/29/2004 Document Revised: 07/27/2016 Document Reviewed: 11/06/2015 Elsevier Interactive Patient Education  [employer].

## 2018-10-01 ENCOUNTER — Telehealth: Payer: Self-pay

## 2018-10-01 DIAGNOSIS — M6281 Muscle weakness (generalized): Secondary | ICD-10-CM | POA: Diagnosis not present

## 2018-10-01 DIAGNOSIS — M542 Cervicalgia: Secondary | ICD-10-CM | POA: Diagnosis not present

## 2018-10-01 DIAGNOSIS — R51 Headache: Secondary | ICD-10-CM | POA: Diagnosis not present

## 2018-10-01 NOTE — Telephone Encounter (Signed)
Please call and clarify with patient if she can take oxycodone or hydrocodone ? She has tramadol for pain. If not enough we can switch to one of those two medicine.

## 2018-10-01 NOTE — Telephone Encounter (Signed)
Patient stated that she will just stay on the Tramadol that she is on. [name],CMA

## 2018-10-01 NOTE — Telephone Encounter (Signed)
Pt left msg Friday at 4:59 PM stating she was under the impression that Dr Georgina Snell was going to call in some pain medication for her.   Wanting to know if Dr Georgina Snell will call in medication to [facility]

## 2018-10-10 DIAGNOSIS — M6281 Muscle weakness (generalized): Secondary | ICD-10-CM | POA: Diagnosis not present

## 2018-10-10 DIAGNOSIS — R51 Headache: Secondary | ICD-10-CM | POA: Diagnosis not present

## 2018-10-10 DIAGNOSIS — M542 Cervicalgia: Secondary | ICD-10-CM | POA: Diagnosis not present

## 2018-10-10 DIAGNOSIS — M545 Low back pain: Secondary | ICD-10-CM | POA: Diagnosis not present

## 2018-10-11 ENCOUNTER — Encounter: Payer: Self-pay | Admitting: Family Medicine

## 2018-10-11 ENCOUNTER — Ambulatory Visit (INDEPENDENT_AMBULATORY_CARE_PROVIDER_SITE_OTHER): Payer: Medicare HMO | Admitting: Family Medicine

## 2018-10-11 VITALS — BP 158/80 | HR 77 | Ht 65.0 in | Wt 135.0 lb

## 2018-10-11 DIAGNOSIS — M2041 Other hammer toe(s) (acquired), right foot: Secondary | ICD-10-CM

## 2018-10-11 DIAGNOSIS — I890 Lymphedema, not elsewhere classified: Secondary | ICD-10-CM

## 2018-10-11 DIAGNOSIS — M76892 Other specified enthesopathies of left lower limb, excluding foot: Secondary | ICD-10-CM

## 2018-10-11 NOTE — Patient Instructions (Addendum)
Thank you for coming in today.  Recheck with me as needed.   You should hear from Podiatry soon about hammer toe surgery evaluation.  Be upfront about your goals and expectations.     Hammer Toe Hammer toe is a change in the shape (a deformity) of your second, third, or fourth toe. The deformity causes the middle joint of your toe to stay bent. This causes pain, especially when you are wearing shoes. Hammer toe starts gradually. At first, the toe can be straightened. Gradually over time, the deformity becomes stiff and permanent. Early treatments to keep the toe straight may relieve pain. As the deformity becomes stiff and permanent, surgery may be needed to straighten the toe. What are the causes? Hammer toe is caused by abnormal bending of the toe joint that is closest to your foot. It happens gradually over time. This pulls on the muscles and connections (tendons) of the toe joint, making them weak and stiff. It is often related to wearing shoes that are too short or narrow and do not let your toes straighten. What increases the risk? You may be at greater risk for hammer toe if you:  Are female.  Are older.  Wear shoes that are too small.  Wear high-heeled shoes that pinch your toes.  Are a Engineer, mining.  Have a second toe that is longer than your big toe (first toe).  Injure your foot or toe.  Have arthritis.  Have a family history of hammer toe.  Have a nerve or muscle disorder.  What are the signs or symptoms? The main symptoms of this condition are pain and deformity of the toe. The pain is worse when wearing shoes, walking, or running. Other symptoms may include:  Corns or calluses over the bent part of the toe or between the toes.  Redness and a burning feeling on the toe.  An open sore that forms on the top of the toe.  Not being able to straighten the toe.  How is this diagnosed? This condition is diagnosed based on your symptoms and a physical exam.  During the exam, your health care provider will try to straighten your toe to see how stiff the deformity is. You may also have tests, such as:  A blood test to check for rheumatoid arthritis.  An X-ray to show how severe the deformity is.  How is this treated? Treatment for this condition will depend on how stiff the deformity is. Surgery is often needed. However, sometimes a hammer toe can be straightened without surgery. Treatments that do not involve surgery include:  Taping the toe into a straightened position.  Using pads and cushions to protect the toe (orthotics).  Wearing shoes that provide enough room for the toes.  Doing toe-stretching exercises at home.  Taking an NSAID to reduce pain and swelling.  If these treatments do not help or the toe cannot be straightened, surgery is the next option. The most common surgeries used to straighten a hammer toe include:  Arthroplasty. In this procedure, part of the joint is removed, and that allows the toe to straighten.  Fusion. In this procedure, cartilage between the two bones of the joint is taken out and the bones are fused together into one longer bone.  Implantation. In this procedure, part of the bone is removed and replaced with an implant to let the toe move again.  Flexor tendon transfer. In this procedure, the tendons that curl the toes down (flexor tendons) are repositioned.  Follow these instructions at home:  Take over-the-counter and prescription medicines only as told by your health care provider.  Do toe straightening and stretching exercises as told by your health care provider.  Keep all follow-up visits as told by your health care provider. This is important. How is this prevented?  Wear shoes that give your toes enough room and do not cause pain.  Do not wear high-heeled shoes. Contact a health care provider if:  Your pain gets worse.  Your toe becomes red or swollen.  You develop an open sore on  your toe. This information is not intended to replace advice given to you by your health care provider. Make sure you discuss any questions you have with your health care provider. Document Released: 11/18/2000 Document Revised: 06/10/2016 Document Reviewed: 03/16/2016 Elsevier Interactive Patient Education  [employer].

## 2018-10-12 DIAGNOSIS — I890 Lymphedema, not elsewhere classified: Secondary | ICD-10-CM | POA: Diagnosis not present

## 2018-10-12 DIAGNOSIS — M79671 Pain in right foot: Secondary | ICD-10-CM | POA: Diagnosis not present

## 2018-10-12 DIAGNOSIS — R29898 Other symptoms and signs involving the musculoskeletal system: Secondary | ICD-10-CM | POA: Diagnosis not present
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
## 2018-10-16 ENCOUNTER — Encounter: Payer: Self-pay | Admitting: Family Medicine

## 2018-10-17 DIAGNOSIS — M545 Low back pain: Secondary | ICD-10-CM | POA: Diagnosis not present

## 2018-10-17 DIAGNOSIS — R51 Headache: Secondary | ICD-10-CM | POA: Diagnosis not present

## 2018-10-17 DIAGNOSIS — M542 Cervicalgia: Secondary | ICD-10-CM | POA: Diagnosis not present

## 2018-10-17 DIAGNOSIS — M6281 Muscle weakness (generalized): Secondary | ICD-10-CM | POA: Diagnosis not present

## 2018-10-19 DIAGNOSIS — M79671 Pain in right foot: Secondary | ICD-10-CM | POA: Diagnosis not present

## 2018-10-19 DIAGNOSIS — I890 Lymphedema, not elsewhere classified: Secondary | ICD-10-CM | POA: Diagnosis not present

## 2018-10-19 DIAGNOSIS — R29898 Other symptoms and signs involving the musculoskeletal system: Secondary | ICD-10-CM | POA: Diagnosis not present

## 2018-10-21 IMAGING — CT CT ABD-PELV W/ CM
2 of 5 series · 16 of 46 positions shown, 18 images · IV contrast (APPLIED)
Comparison: None.

CLINICAL DATA: Right lower quadrant abdominal pain. Bilateral lower
extremity swelling.

EXAM:
CT ABDOMEN AND PELVIS WITH CONTRAST
TECHNIQUE: Multidetector CT imaging of the abdomen and pelvis was performed
using the standard protocol following bolus administration of
intravenous contrast.
CONTRAST:  100mL 09KYQD-[id number] IOPAMIDOL ([id number]) INJECTION 61%

[Series 2: axial st · axial · 0.95mm/px · z∈[-461,-66]mm · 13 of 89 slices shown, 15 images]
[im 5/89  soft-tissue]
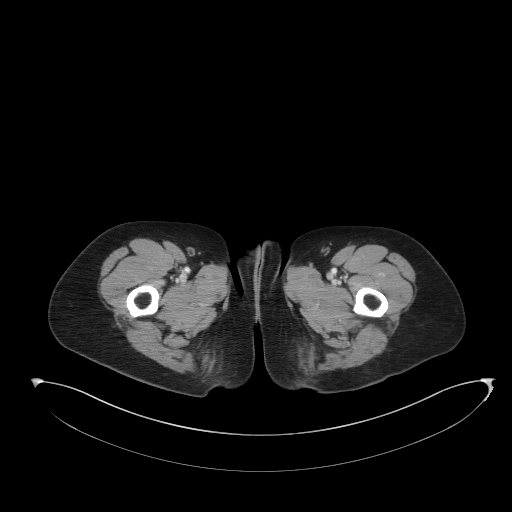
[im 5/89  bone]
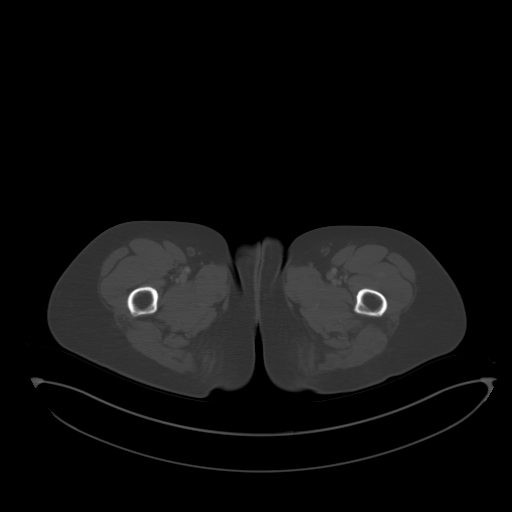
[im 14/89  soft-tissue]
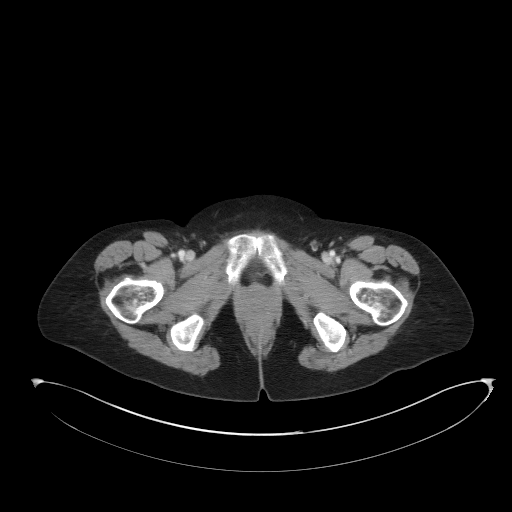
[im 18/89  soft-tissue]
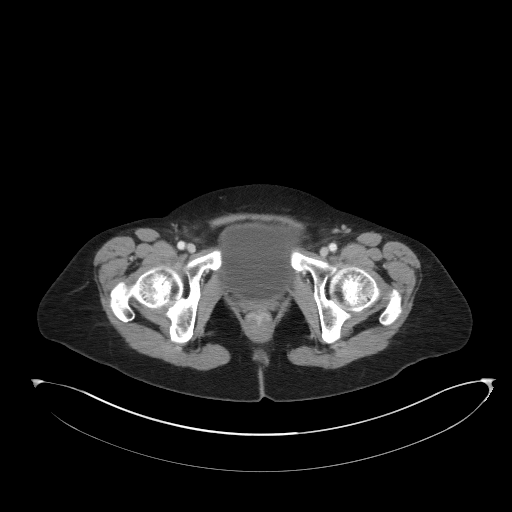
[im 27/89  soft-tissue]
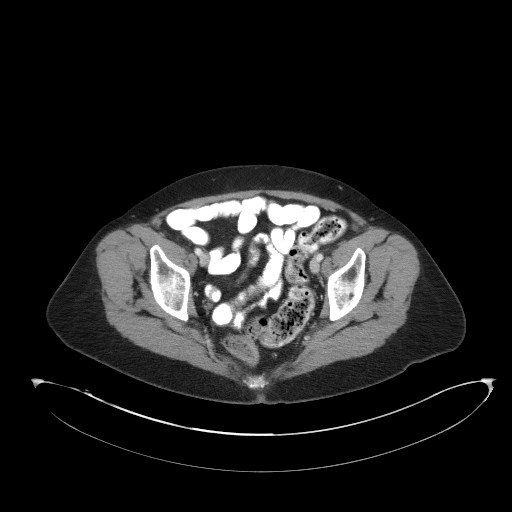
[im 31/89  soft-tissue]
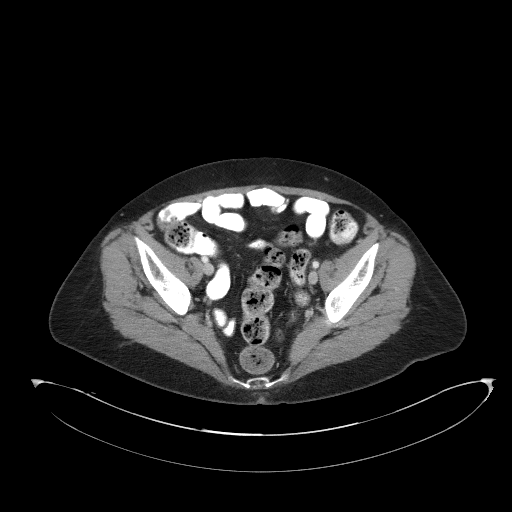
[im 40/89  soft-tissue]
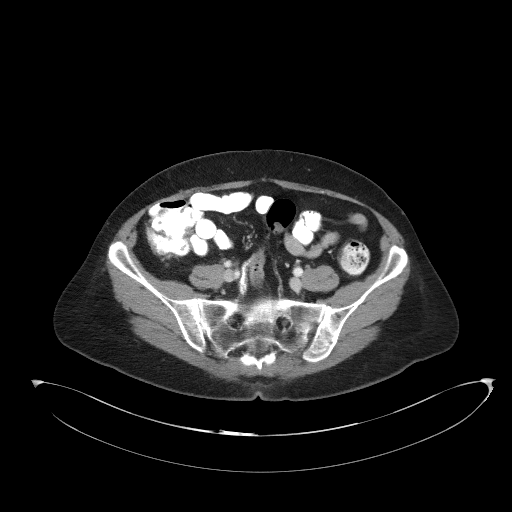
[im 45/89  soft-tissue]
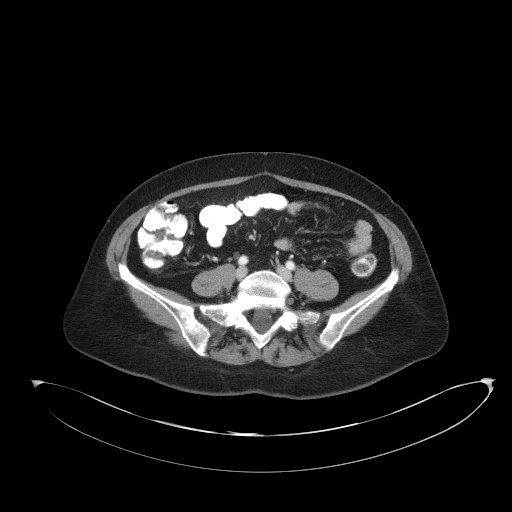
[im 49/89  soft-tissue]
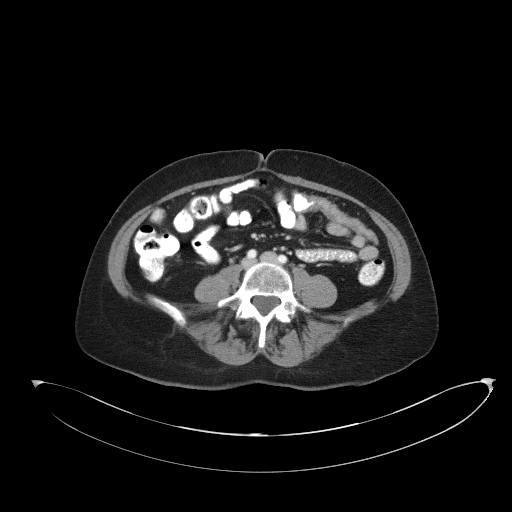
[im 58/89  soft-tissue]
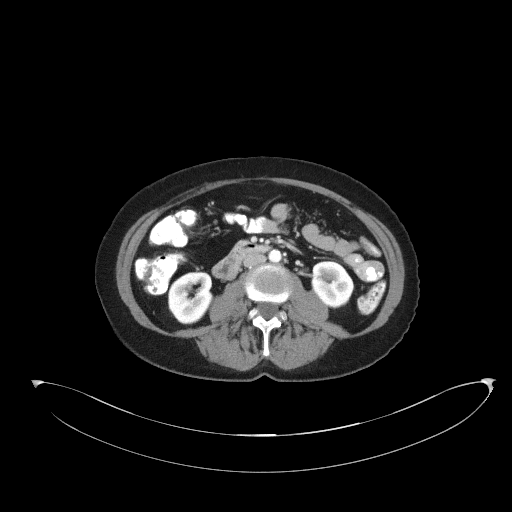
[im 58/89  bone]
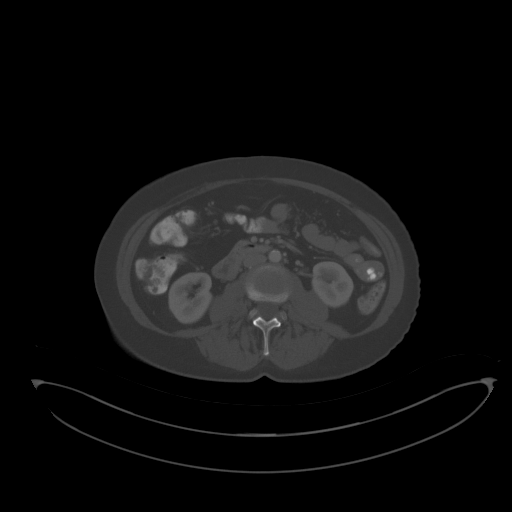
[im 62/89  soft-tissue]
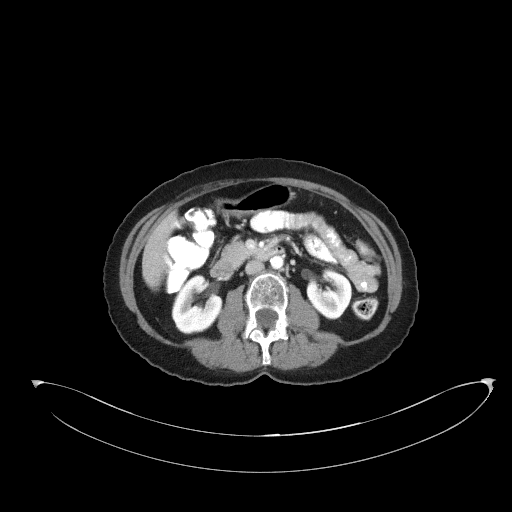
[im 71/89  soft-tissue]
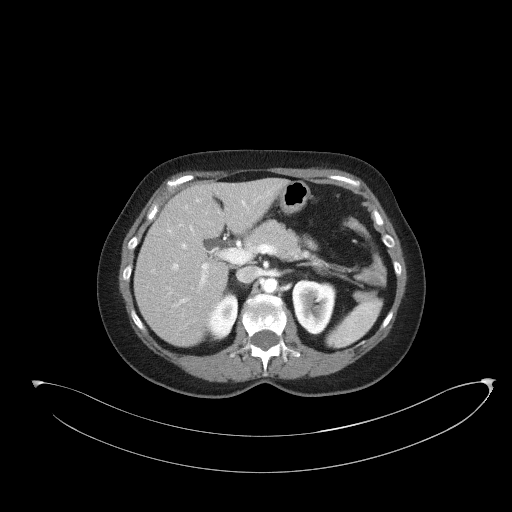
[im 75/89  soft-tissue]
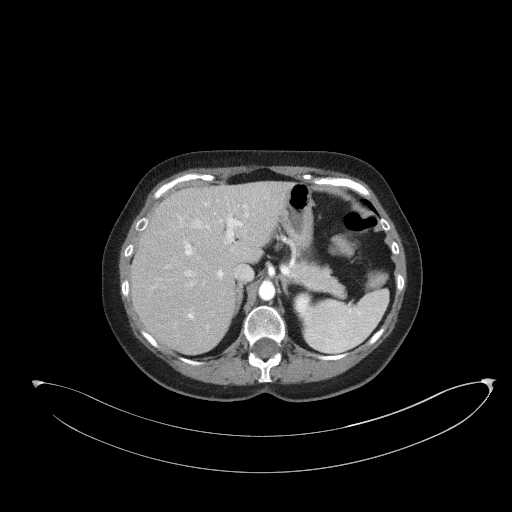
[im 84/89  soft-tissue]
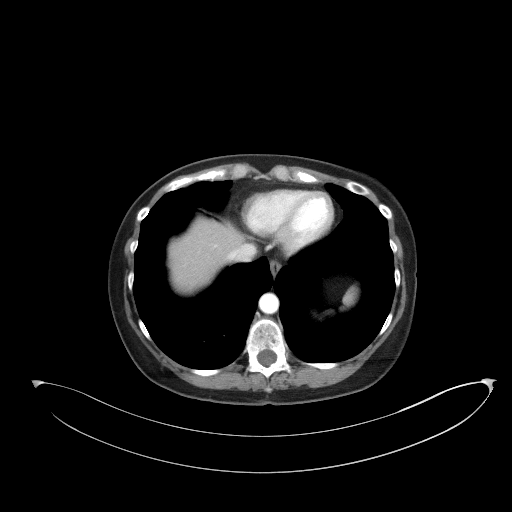

[Series 5: coronal st · coronal · 0.73mm/px · 3 of 82 slices shown]
[im 28/82  soft-tissue]
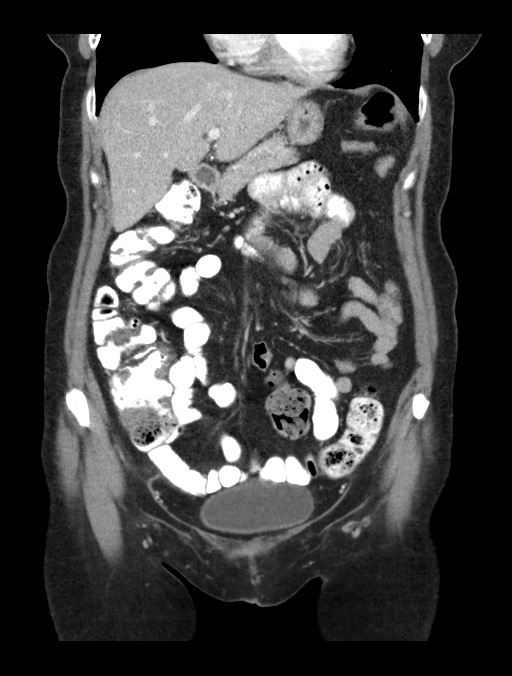
[im 37/82  soft-tissue]
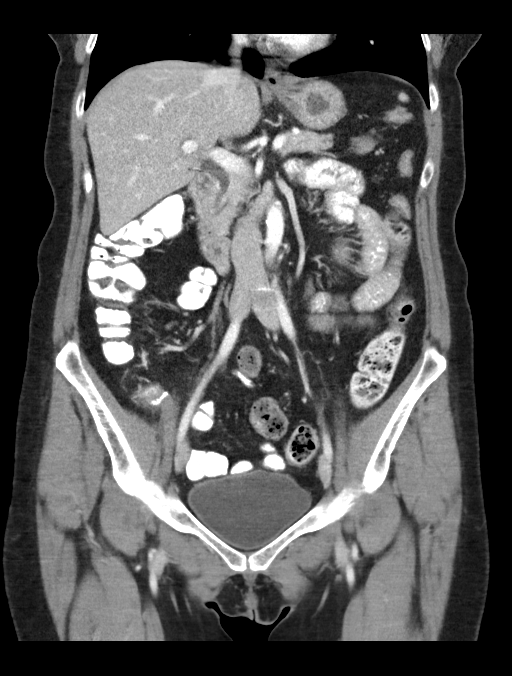
[im 46/82  soft-tissue]
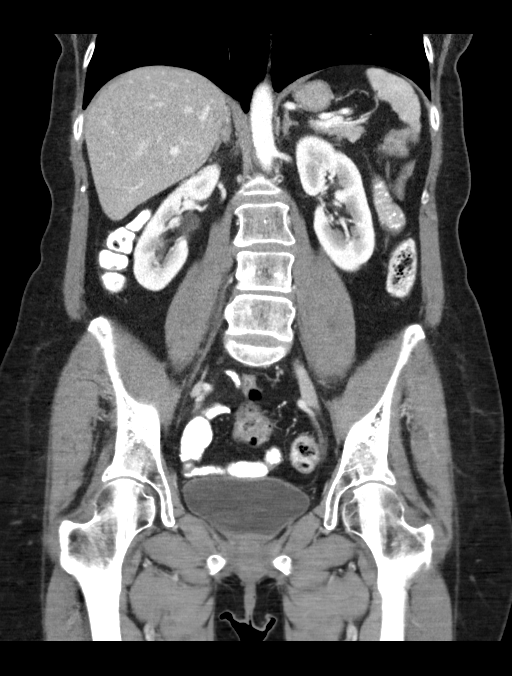

[16 of 46 positions shown; findings below may reference images not displayed]

FINDINGS: Lower chest: No acute abnormality.

Hepatobiliary: No focal liver abnormality is seen. Status post
cholecystectomy. No biliary dilatation.

Pancreas: Unremarkable. No pancreatic ductal dilatation or
surrounding inflammatory changes.

Spleen: Normal in size without focal abnormality.

Adrenals/Urinary Tract: Adrenal glands are unremarkable. Kidneys are
normal, without renal calculi, focal lesion, or hydronephrosis.
Bladder is unremarkable.

Stomach/Bowel: Stomach appears normal. Status post appendectomy.
There is no evidence of bowel obstruction or inflammation.

Vascular/Lymphatic: No significant vascular findings are present. No
enlarged abdominal or pelvic lymph nodes.

Reproductive: Status post hysterectomy. No adnexal masses.

Other: No abdominal wall hernia or abnormality. No abdominopelvic
ascites.

Musculoskeletal: No acute or significant osseous findings.
IMPRESSION: No acute abnormality seen in the abdomen or pelvis.

## 2018-10-24 DIAGNOSIS — M6281 Muscle weakness (generalized): Secondary | ICD-10-CM | POA: Diagnosis not present

## 2018-10-24 DIAGNOSIS — M542 Cervicalgia: Secondary | ICD-10-CM | POA: Diagnosis not present

## 2018-10-24 DIAGNOSIS — M545 Low back pain: Secondary | ICD-10-CM | POA: Diagnosis not present

## 2018-10-24 DIAGNOSIS — R51 Headache: Secondary | ICD-10-CM | POA: Diagnosis not present

## 2018-10-25 DIAGNOSIS — M79671 Pain in right foot: Secondary | ICD-10-CM | POA: Diagnosis not present

## 2018-10-25 DIAGNOSIS — I890 Lymphedema, not elsewhere classified: Secondary | ICD-10-CM | POA: Diagnosis not present

## 2018-10-25 DIAGNOSIS — R29898 Other symptoms and signs involving the musculoskeletal system: Secondary | ICD-10-CM | POA: Diagnosis not present

## 2018-10-29 ENCOUNTER — Other Ambulatory Visit: Payer: Self-pay | Admitting: Family Medicine

## 2018-10-29 DIAGNOSIS — M6281 Muscle weakness (generalized): Secondary | ICD-10-CM | POA: Diagnosis not present

## 2018-10-29 DIAGNOSIS — M545 Low back pain: Secondary | ICD-10-CM | POA: Diagnosis not present

## 2018-10-29 DIAGNOSIS — R51 Headache: Secondary | ICD-10-CM | POA: Diagnosis not present

## 2018-10-29 DIAGNOSIS — M542 Cervicalgia: Secondary | ICD-10-CM | POA: Diagnosis not present

## 2018-10-29 NOTE — Telephone Encounter (Signed)
Routing to pcp for signature.Breanna Casey, CMA  

## 2018-11-07 DIAGNOSIS — M79671 Pain in right foot: Secondary | ICD-10-CM | POA: Diagnosis not present

## 2018-11-08 ENCOUNTER — Encounter: Payer: Self-pay | Admitting: Family Medicine

## 2018-11-08 DIAGNOSIS — Z961 Presence of intraocular lens: Secondary | ICD-10-CM | POA: Diagnosis not present

## 2018-11-08 DIAGNOSIS — H5231 Anisometropia: Secondary | ICD-10-CM | POA: Diagnosis not present

## 2018-11-08 DIAGNOSIS — R29898 Other symptoms and signs involving the musculoskeletal system: Secondary | ICD-10-CM | POA: Diagnosis not present

## 2018-11-08 DIAGNOSIS — H25811 Combined forms of age-related cataract, right eye: Secondary | ICD-10-CM | POA: Diagnosis not present

## 2018-11-08 DIAGNOSIS — M79671 Pain in right foot: Secondary | ICD-10-CM | POA: Diagnosis not present

## 2018-11-08 DIAGNOSIS — H26492 Other secondary cataract, left eye: Secondary | ICD-10-CM | POA: Diagnosis not present

## 2018-11-08 DIAGNOSIS — I890 Lymphedema, not elsewhere classified: Secondary | ICD-10-CM | POA: Diagnosis not present

## 2018-11-12 DIAGNOSIS — R51 Headache: Secondary | ICD-10-CM | POA: Diagnosis not present

## 2018-11-12 DIAGNOSIS — M545 Low back pain: Secondary | ICD-10-CM | POA: Diagnosis not present

## 2018-11-12 DIAGNOSIS — M6281 Muscle weakness (generalized): Secondary | ICD-10-CM | POA: Diagnosis not present

## 2018-11-12 DIAGNOSIS — M542 Cervicalgia: Secondary | ICD-10-CM | POA: Diagnosis not present

## 2018-11-15 ENCOUNTER — Other Ambulatory Visit: Payer: Self-pay | Admitting: Family Medicine

## 2018-11-19 ENCOUNTER — Other Ambulatory Visit: Payer: Self-pay | Admitting: Family Medicine

## 2018-11-19 ENCOUNTER — Encounter: Payer: Self-pay | Admitting: Family Medicine

## 2018-11-19 DIAGNOSIS — M542 Cervicalgia: Secondary | ICD-10-CM | POA: Diagnosis not present

## 2018-11-19 DIAGNOSIS — M6281 Muscle weakness (generalized): Secondary | ICD-10-CM | POA: Diagnosis not present

## 2018-11-19 DIAGNOSIS — M545 Low back pain: Secondary | ICD-10-CM | POA: Diagnosis not present

## 2018-11-19 DIAGNOSIS — R51 Headache: Secondary | ICD-10-CM | POA: Diagnosis not present

## 2018-11-20 ENCOUNTER — Encounter: Payer: Self-pay | Admitting: Family Medicine

## 2018-11-20 MED ORDER — FLUCONAZOLE 150 MG PO TABS: 150.0000 mg | ORAL_TABLET | Freq: Once | ORAL | 1 refills | Status: AC

## 2018-11-20 NOTE — Telephone Encounter (Signed)
New rx sent

## 2018-11-21 ENCOUNTER — Other Ambulatory Visit: Payer: Self-pay | Admitting: Family Medicine

## 2018-11-21 MED ORDER — FLUCONAZOLE 150 MG PO TABS: 150.0000 mg | ORAL_TABLET | Freq: Once | ORAL | 0 refills | Status: AC

## 2018-11-26 ENCOUNTER — Other Ambulatory Visit: Payer: Self-pay | Admitting: Family Medicine

## 2018-11-29 DIAGNOSIS — R51 Headache: Secondary | ICD-10-CM | POA: Diagnosis not present

## 2018-11-29 DIAGNOSIS — M542 Cervicalgia: Secondary | ICD-10-CM | POA: Diagnosis not present

## 2018-11-29 DIAGNOSIS — M6281 Muscle weakness (generalized): Secondary | ICD-10-CM | POA: Diagnosis not present

## 2018-11-29 DIAGNOSIS — M545 Low back pain: Secondary | ICD-10-CM | POA: Diagnosis not present

## 2018-12-03 DIAGNOSIS — M545 Low back pain: Secondary | ICD-10-CM | POA: Diagnosis not present

## 2018-12-03 DIAGNOSIS — M6281 Muscle weakness (generalized): Secondary | ICD-10-CM | POA: Diagnosis not present

## 2018-12-03 DIAGNOSIS — M542 Cervicalgia: Secondary | ICD-10-CM | POA: Diagnosis not present

## 2019-01-03 ENCOUNTER — Other Ambulatory Visit: Payer: Self-pay | Admitting: Sports Medicine

## 2019-01-04 ENCOUNTER — Encounter: Payer: Self-pay | Admitting: Family Medicine

## 2019-01-04 MED ORDER — TRAMADOL HCL 50 MG PO TABS: 50.0000 mg | ORAL_TABLET | Freq: Four times a day (QID) | ORAL | 0 refills | Status: DC | PRN

## 2019-01-04 NOTE — Telephone Encounter (Signed)
Last RF 11/29/18 for #120 with no RF, written by Dr T  Last OV 10/11/2018  No upcoming OV scheduled with sports med   RX pended, please review and send if appropriate

## 2019-01-04 NOTE — Telephone Encounter (Signed)
That should go back to PCP. This is chronic pain medicine previouly prescribed by PCP. I think I covered it once while she was out.

## 2019-01-04 NOTE — Telephone Encounter (Signed)
Rx declined. Separate request sent to PCP for review.

## 2019-01-09 MED ORDER — TRAMADOL HCL 50 MG PO TABS: 50.0000 mg | ORAL_TABLET | Freq: Four times a day (QID) | ORAL | 0 refills | Status: DC | PRN

## 2019-01-09 NOTE — Telephone Encounter (Signed)
Rx sent for tramadol fo mail order. pls let pt know done.

## 2019-01-17 ENCOUNTER — Encounter: Payer: Self-pay | Admitting: Family Medicine

## 2019-01-22 NOTE — Telephone Encounter (Signed)
I called the number on file and since the patient does not have [employer] any more the walker will not be covered and per Breanna Casey (CSR) they are not able to back date the prescription since she has new insurance through [place].   Call reference number: [id number]. Please advise.

## 2019-03-06 ENCOUNTER — Other Ambulatory Visit: Payer: Self-pay | Admitting: Family Medicine

## 2019-03-06 NOTE — Telephone Encounter (Signed)
Needs virtual visit. Hasn't been seen in 6 months for pain meds.  Can schedule for tomorrow if would like.

## 2019-03-06 NOTE — Telephone Encounter (Signed)
Message sent to patient

## 2019-03-07 ENCOUNTER — Other Ambulatory Visit: Payer: Self-pay

## 2019-03-07 ENCOUNTER — Telehealth: Payer: Self-pay | Admitting: *Deleted

## 2019-03-07 ENCOUNTER — Encounter: Payer: Self-pay | Admitting: Family Medicine

## 2019-03-07 ENCOUNTER — Ambulatory Visit (INDEPENDENT_AMBULATORY_CARE_PROVIDER_SITE_OTHER): Payer: Medicare Other | Admitting: Family Medicine

## 2019-03-07 VITALS — BP 114/70 | HR 72 | Ht 66.0 in | Wt 129.4 lb

## 2019-03-07 DIAGNOSIS — G43109 Migraine with aura, not intractable, without status migrainosus: Secondary | ICD-10-CM | POA: Diagnosis not present

## 2019-03-07 DIAGNOSIS — J302 Other seasonal allergic rhinitis: Secondary | ICD-10-CM | POA: Diagnosis not present

## 2019-03-07 DIAGNOSIS — M797 Fibromyalgia: Secondary | ICD-10-CM

## 2019-03-07 MED ORDER — TRAMADOL HCL 50 MG PO TABS: 50.0000 mg | ORAL_TABLET | Freq: Four times a day (QID) | ORAL | 0 refills | Status: DC | PRN

## 2019-03-07 NOTE — Telephone Encounter (Signed)
Verified last RF. It was on 02/06/2019 #120 this was a 30 day supply.Elouise Munroe, [place]

## 2019-03-07 NOTE — Progress Notes (Signed)
Virtual Visit via Telephone Note  I connected with Breanna Casey on 03/07/19 at  1:40 PM EDT by telephone and verified that I am speaking with the correct person using two identifiers.   I discussed the limitations, risks, security and privacy concerns of performing an evaluation and management service by telephone and the availability of in person appointments. I also discussed with the patient that there may be a patient responsible charge related to this service. The patient expressed understanding and agreed to proceed.  Patient was in her home for the encounter and the provider was at the med center [facility] office.   Subjective:    CC: med refills. 6 mo f/u  HPI:  She has been staying at home.  She is doing well.  She says is been a little bit hard to stay at home and not get out.  Her husband is now working from home and doing a lot of conference calls she says is been a little loud and annoying.  Follow-up fibromyalgia.  She is a 63 year old female who is here today for 63-month follow-up. Left shoulder has been really bothering some.  Her right foot is painful as well where she had surgery. She feels the tramadol does help.  She takes the tramadol 3 times a day most days.    Follow-up migraine headaches- she has had more of them,  since stressed at home.  But overall doing okay.  Has had a ST for a  Couple of months, ears are stopped up and runny nose.  Feels like it is allergies. She occ gets sharp quick pain in the left chest.  Used to take Allegra a couple of years ago..  No fevers or chills.  Past medical history, Surgical history, Family history not pertinant except as noted below, Social history, Allergies, and medications have been entered into the medical record, reviewed, and corrections made.   Review of Systems: No fevers, chills, night sweats, weight loss, chest pain, or shortness of breath.   Objective:    General: Speaking clearly in complete  sentences without any shortness of breath.  Alert and oriented x3.  Normal judgment. No apparent acute distress.    Impression and Recommendations:   Fibromylagia - can add tylenol with tramadol.   Most days she is only taking it 3 times a day but sometimes 4.  The last prescription was sent for 90-day supply but I am concerned that the pharmacy may have only filled a 30-day because it is a scheduled drug so we are going to call the pharmacy and confirm that in which case I will need to change the quantity on the current prescription to 120 tabs.  Will refill medications.    Migraine Headaches - will continue current regimen. Will continue monitor.    Seasonal allergies.  Recommend restart her Allegra.  Can try coming off in a month.  Call if not improving with just the over-the-counter antihistamine.  Current Meds  Medication Sig  . AMBULATORY NON FORMULARY MEDICATION Rolling Walker use as needed Disp 1 R26.89 [id number]  . Barberry-Oreg Grape-Goldenseal 174-944-96 MG CAPS   . Coenzyme Q10 (CO Q 10 PO) Take by mouth.    . DIGESTIVE ENZYMES PO Take by mouth.    Marland Kitchen FEVERFEW PO Take by mouth.    . fish oil-omega-3 fatty acids 1000 MG capsule Take 2 g by mouth daily.    . magnesium chloride 200 MG/ML SOLN   . Menaquinone-7 (VITAMIN K2 PO) Take  200 mg by mouth daily.  . Probiotic Product (ACIDOPHILUS) 90-25 MG CHEW Chew 1 tablet by mouth daily.  . traMADol (ULTRAM) 50 MG tablet Take 1 tablet (50 mg total) by mouth every 6 (six) hours as needed.  . Vitamin D, Cholecalciferol, 1000 units CAPS   . [DISCONTINUED] traMADol (ULTRAM) 50 MG tablet Take 1 tablet (50 mg total) by mouth every 6 (six) hours as needed.   Meds ordered this encounter  Medications  . traMADol (ULTRAM) 50 MG tablet    Sig: Take 1 tablet (50 mg total) by mouth every 6 (six) hours as needed.    Dispense:  120 tablet    Refill:  0    Not to exceed 5 additional fills before 04/28/2019      I discussed the assessment and  treatment plan with the patient. The patient was provided an opportunity to ask questions and all were answered. The patient agreed with the plan and demonstrated an understanding of the instructions.   The patient was advised to call back or seek an in-person evaluation if the symptoms worsen or if the condition fails to improve as anticipated.  I provided 22 minutes of non-face-to-face time during this encounter.   Beatrice Lecher, MD

## 2019-03-07 NOTE — Progress Notes (Signed)
Pt reports that she now gets this(tramadol) from [facility] .Elouise Munroe, [place]

## 2019-04-03 ENCOUNTER — Other Ambulatory Visit: Payer: Self-pay | Admitting: Family Medicine

## 2019-05-06 ENCOUNTER — Other Ambulatory Visit: Payer: Self-pay | Admitting: Family Medicine

## 2019-05-06 NOTE — Telephone Encounter (Signed)
[facility] pharmacy requesting med refill for tramadol.

## 2019-05-16 ENCOUNTER — Encounter: Payer: Self-pay | Admitting: Family Medicine

## 2019-05-20 ENCOUNTER — Encounter: Payer: Self-pay | Admitting: Family Medicine

## 2019-05-20 ENCOUNTER — Ambulatory Visit (INDEPENDENT_AMBULATORY_CARE_PROVIDER_SITE_OTHER): Payer: Medicare Other | Admitting: Family Medicine

## 2019-05-20 VITALS — BP 173/80 | HR 72 | Temp 98.3°F | Wt 139.0 lb

## 2019-05-20 DIAGNOSIS — M79671 Pain in right foot: Secondary | ICD-10-CM | POA: Diagnosis not present

## 2019-05-20 DIAGNOSIS — S0990XA Unspecified injury of head, initial encounter: Secondary | ICD-10-CM

## 2019-05-20 NOTE — Patient Instructions (Addendum)
Thank you for coming in today. We will continue to follow for headache. I do not think you have a concussion.  Keep me updated about your head.  I placed a referral for a second opinion about your foot.
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
## 2019-06-03 ENCOUNTER — Ambulatory Visit (INDEPENDENT_AMBULATORY_CARE_PROVIDER_SITE_OTHER): Payer: Medicare Other

## 2019-06-03 ENCOUNTER — Ambulatory Visit (INDEPENDENT_AMBULATORY_CARE_PROVIDER_SITE_OTHER): Payer: Medicare Other | Admitting: Podiatry

## 2019-06-03 ENCOUNTER — Encounter: Payer: Self-pay | Admitting: Podiatry

## 2019-06-03 ENCOUNTER — Other Ambulatory Visit: Payer: Self-pay

## 2019-06-03 DIAGNOSIS — M7741 Metatarsalgia, right foot: Secondary | ICD-10-CM | POA: Diagnosis not present

## 2019-06-03 DIAGNOSIS — M779 Enthesopathy, unspecified: Secondary | ICD-10-CM

## 2019-06-03 DIAGNOSIS — M722 Plantar fascial fibromatosis: Secondary | ICD-10-CM

## 2019-06-03 DIAGNOSIS — M778 Other enthesopathies, not elsewhere classified: Secondary | ICD-10-CM
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
## 2019-06-04 ENCOUNTER — Other Ambulatory Visit: Payer: Self-pay | Admitting: Family Medicine

## 2019-06-04 NOTE — Addendum Note (Signed)
Addended by: [place] Mon R on: 06/04/2019 10:12 AM   Modules accepted: Orders

## 2019-06-05 NOTE — Telephone Encounter (Signed)
Appointment has been made

## 2019-06-05 NOTE — Telephone Encounter (Signed)
Tramadol was refilled but please encourage her to schedule follow-up in August for a 46-month follow-up for her medication since it is a scheduled drug.

## 2019-06-05 NOTE — Telephone Encounter (Signed)
Please call pt to schedule

## 2019-06-10 ENCOUNTER — Encounter: Payer: Self-pay | Admitting: Podiatry

## 2019-06-26 ENCOUNTER — Ambulatory Visit: Payer: Medicare Other | Admitting: Orthotics

## 2019-06-26 ENCOUNTER — Telehealth: Payer: Self-pay | Admitting: Podiatry

## 2019-06-26 ENCOUNTER — Other Ambulatory Visit: Payer: Self-pay

## 2019-06-26 DIAGNOSIS — M7741 Metatarsalgia, right foot: Secondary | ICD-10-CM

## 2019-06-26 DIAGNOSIS — M778 Other enthesopathies, not elsewhere classified: Secondary | ICD-10-CM

## 2019-06-26 NOTE — Telephone Encounter (Signed)
Patient would like a referral to [facility] instead of going to [facility].

## 2019-06-26 NOTE — Progress Notes (Signed)
Patient came in today to pick up custom made foot orthotics.  The goals were accomplished and the patient reported no dissatisfaction with said orthotics.  Patient was advised of breakin period and how to report any issues. 

## 2019-06-26 NOTE — Telephone Encounter (Signed)
That is OK to do. Breanna Casey can you please put in the referral for her? Not sure of what location she wants.

## 2019-06-28 NOTE — Telephone Encounter (Signed)
Put the referral to [facility] and called the patient to let her know. Breanna Casey

## 2019-06-28 NOTE — Telephone Encounter (Signed)
Tried to call the patient and the line was busy. Breanna Casey

## 2019-06-28 NOTE — Addendum Note (Signed)
Addended by: Cranford Mon R on: 06/28/2019 03:13 PM   Modules accepted: Orders

## 2019-07-04 ENCOUNTER — Other Ambulatory Visit: Payer: Self-pay | Admitting: Family Medicine

## 2019-07-05 NOTE — Telephone Encounter (Signed)
Pt has f/u appt on 07/23/2019.Breanna Casey, [place]

## 2019-07-11 ENCOUNTER — Ambulatory Visit: Payer: Medicare Other | Admitting: Rehabilitative and Restorative Service Providers"

## 2019-07-22 ENCOUNTER — Ambulatory Visit: Payer: Medicare Other | Admitting: Physician Assistant

## 2019-07-23 ENCOUNTER — Encounter: Payer: Self-pay | Admitting: Family Medicine

## 2019-07-23 ENCOUNTER — Ambulatory Visit (INDEPENDENT_AMBULATORY_CARE_PROVIDER_SITE_OTHER): Payer: Medicare Other | Admitting: Family Medicine

## 2019-07-23 VITALS — BP 111/75 | HR 80 | Temp 98.1°F | Ht 66.0 in | Wt 136.8 lb

## 2019-07-23 DIAGNOSIS — M79621 Pain in right upper arm: Secondary | ICD-10-CM

## 2019-07-23 DIAGNOSIS — M797 Fibromyalgia: Secondary | ICD-10-CM

## 2019-07-23 DIAGNOSIS — E7800 Pure hypercholesterolemia, unspecified: Secondary | ICD-10-CM

## 2019-07-23 MED ORDER — CYCLOBENZAPRINE HCL 5 MG PO TABS: 5.0000 mg | ORAL_TABLET | Freq: Every evening | ORAL | 1 refills | Status: DC | PRN

## 2019-07-23 NOTE — Assessment & Plan Note (Signed)
Due to recheck lipids. 

## 2019-07-23 NOTE — Progress Notes (Signed)
Pt reports that her lymph nodes L axilla began to hurt on Friday unsure why or cause. Denies any changes to lotions,soaps,degertents.  Maryruth Eve, [place] Crocker, CMA
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
## 2019-07-23 NOTE — Assessment & Plan Note (Signed)
Unfortunately she has been having more problems with her fibromyalgia of the summer than she normally would.  We discussed maybe a trial of a low-dose muscle relaxer at bedtime to see if this is helpful.  Otherwise continue with current regimen.  Tramadol is due for refill at the end of the month.

## 2019-07-24 DIAGNOSIS — D2261 Melanocytic nevi of right upper limb, including shoulder: Secondary | ICD-10-CM | POA: Diagnosis not present

## 2019-07-24 DIAGNOSIS — Z08 Encounter for follow-up examination after completed treatment for malignant neoplasm: Secondary | ICD-10-CM | POA: Diagnosis not present

## 2019-07-24 DIAGNOSIS — Z85820 Personal history of malignant melanoma of skin: Secondary | ICD-10-CM | POA: Diagnosis not present

## 2019-07-25 DIAGNOSIS — M797 Fibromyalgia: Secondary | ICD-10-CM | POA: Diagnosis not present

## 2019-07-25 DIAGNOSIS — M79621 Pain in right upper arm: Secondary | ICD-10-CM | POA: Diagnosis not present

## 2019-07-25 DIAGNOSIS — E7800 Pure hypercholesterolemia, unspecified: Secondary | ICD-10-CM | POA: Diagnosis not present

## 2019-07-26 ENCOUNTER — Encounter: Payer: Self-pay | Admitting: Family Medicine

## 2019-07-26 ENCOUNTER — Other Ambulatory Visit: Payer: Self-pay | Admitting: *Deleted

## 2019-07-26 DIAGNOSIS — R748 Abnormal levels of other serum enzymes: Secondary | ICD-10-CM

## 2019-07-26 LAB — COMPLETE METABOLIC PANEL WITH GFR
AG Ratio: 2.1 (calc) (ref 1.0–2.5)
ALT: 44 U/L — ABNORMAL HIGH (ref 6–29)
AST: 41 U/L — ABNORMAL HIGH (ref 10–35)
Albumin: 4.9 g/dL (ref 3.6–5.1)
Alkaline phosphatase (APISO): 81 U/L (ref 37–153)
BUN: 7 mg/dL (ref 7–25)
CO2: 25 mmol/L (ref 20–32)
Calcium: 10.1 mg/dL (ref 8.6–10.4)
Chloride: 106 mmol/L (ref 98–110)
Creat: 0.64 mg/dL (ref 0.50–0.99)
GFR, Est African American: 111 mL/min/{1.73_m2} (ref >=60)
GFR, Est Non African American: 96 mL/min/{1.73_m2} (ref >=60)
Globulin: 2.3 g/dL (calc) (ref 1.9–3.7)
Glucose, Bld: 97 mg/dL (ref 65–99)
Potassium: 4.2 mmol/L (ref 3.5–5.3)
Sodium: 142 mmol/L (ref 135–146)
Total Bilirubin: 0.5 mg/dL (ref 0.2–1.2)
Total Protein: 7.2 g/dL (ref 6.1–8.1)

## 2019-07-26 LAB — LIPID PANEL
Cholesterol: 281 mg/dL — ABNORMAL HIGH (ref <200)
HDL: 68 mg/dL (ref >=50)
LDL Cholesterol (Calc): 169 mg/dL (calc) — ABNORMAL HIGH
Non-HDL Cholesterol (Calc): 213 mg/dL (calc) — ABNORMAL HIGH (ref <130)
Total CHOL/HDL Ratio: 4.1 (calc) (ref <5.0)
Triglycerides: 271 mg/dL — ABNORMAL HIGH (ref <150)

## 2019-07-26 LAB — CBC
HCT: 42.1 % (ref 35.0–45.0)
Hemoglobin: 14.3 g/dL (ref 11.7–15.5)
MCH: 29.5 pg (ref 27.0–33.0)
MCHC: 34 g/dL (ref 32.0–36.0)
MCV: 87 fL (ref 80.0–100.0)
MPV: 10.2 fL (ref 7.5–12.5)
Platelets: 263 10*3/uL (ref 140–400)
RBC: 4.84 10*6/uL (ref 3.80–5.10)
RDW: 12.2 % (ref 11.0–15.0)
WBC: 4.8 10*3/uL (ref 3.8–10.8)

## 2019-08-03 NOTE — Telephone Encounter (Signed)
Please re-order hepatic funciton and UA under [facility] and let pt know when done no fasting needed.

## 2019-08-04 ENCOUNTER — Other Ambulatory Visit: Payer: Self-pay | Admitting: Family Medicine

## 2019-08-08 ENCOUNTER — Encounter: Payer: Self-pay | Admitting: Family Medicine

## 2019-08-19 DIAGNOSIS — M25572 Pain in left ankle and joints of left foot: Secondary | ICD-10-CM | POA: Diagnosis not present

## 2019-08-19 DIAGNOSIS — R262 Difficulty in walking, not elsewhere classified: Secondary | ICD-10-CM | POA: Diagnosis not present

## 2019-08-19 DIAGNOSIS — M25571 Pain in right ankle and joints of right foot: Secondary | ICD-10-CM | POA: Diagnosis not present

## 2019-08-19 DIAGNOSIS — M6281 Muscle weakness (generalized): Secondary | ICD-10-CM | POA: Diagnosis not present

## 2019-08-20 ENCOUNTER — Encounter: Payer: Self-pay | Admitting: Podiatry

## 2019-08-23 ENCOUNTER — Encounter: Payer: Self-pay | Admitting: Family Medicine

## 2019-08-26 DIAGNOSIS — M25571 Pain in right ankle and joints of right foot: Secondary | ICD-10-CM | POA: Diagnosis not present

## 2019-08-26 DIAGNOSIS — M6281 Muscle weakness (generalized): Secondary | ICD-10-CM | POA: Diagnosis not present

## 2019-08-26 DIAGNOSIS — M545 Low back pain: Secondary | ICD-10-CM | POA: Diagnosis not present

## 2019-08-28 LAB — PAIN MGMT, PROFILE 5 W/O MEDMATCH U
Amphetamines: NEGATIVE ng/mL
Barbiturates: NEGATIVE ng/mL
Benzodiazepines: NEGATIVE ng/mL
Cocaine Metabolite: NEGATIVE ng/mL
Creatinine: 21.3 mg/dL
Marijuana Metabolite: NEGATIVE ng/mL
Methadone Metabolite: NEGATIVE ng/mL
Opiates: NEGATIVE ng/mL
Oxidant: NEGATIVE ug/mL
Oxycodone: NEGATIVE ng/mL
pH: 5.7 (ref 4.5–9.0)

## 2019-08-29 ENCOUNTER — Encounter: Payer: Self-pay | Admitting: Family Medicine

## 2019-09-02 DIAGNOSIS — M6281 Muscle weakness (generalized): Secondary | ICD-10-CM | POA: Diagnosis not present

## 2019-09-02 DIAGNOSIS — R262 Difficulty in walking, not elsewhere classified: Secondary | ICD-10-CM | POA: Diagnosis not present

## 2019-09-02 DIAGNOSIS — M25571 Pain in right ankle and joints of right foot: Secondary | ICD-10-CM | POA: Diagnosis not present

## 2019-09-03 ENCOUNTER — Other Ambulatory Visit: Payer: Self-pay | Admitting: Family Medicine

## 2019-09-03 ENCOUNTER — Encounter: Payer: Self-pay | Admitting: Family Medicine

## 2019-09-09 DIAGNOSIS — M6281 Muscle weakness (generalized): Secondary | ICD-10-CM | POA: Diagnosis not present

## 2019-09-09 DIAGNOSIS — M25571 Pain in right ankle and joints of right foot: Secondary | ICD-10-CM | POA: Diagnosis not present

## 2019-09-09 DIAGNOSIS — R262 Difficulty in walking, not elsewhere classified: Secondary | ICD-10-CM | POA: Diagnosis not present

## 2019-09-10 ENCOUNTER — Ambulatory Visit: Payer: Medicare HMO

## 2019-09-16 DIAGNOSIS — M6281 Muscle weakness (generalized): Secondary | ICD-10-CM | POA: Diagnosis not present

## 2019-09-16 DIAGNOSIS — M25571 Pain in right ankle and joints of right foot: Secondary | ICD-10-CM | POA: Diagnosis not present

## 2019-09-16 DIAGNOSIS — R262 Difficulty in walking, not elsewhere classified: Secondary | ICD-10-CM | POA: Diagnosis not present

## 2019-09-23 DIAGNOSIS — M6281 Muscle weakness (generalized): Secondary | ICD-10-CM | POA: Diagnosis not present

## 2019-09-23 DIAGNOSIS — M25571 Pain in right ankle and joints of right foot: Secondary | ICD-10-CM | POA: Diagnosis not present

## 2019-09-30 DIAGNOSIS — R262 Difficulty in walking, not elsewhere classified: Secondary | ICD-10-CM | POA: Diagnosis not present

## 2019-09-30 DIAGNOSIS — M25571 Pain in right ankle and joints of right foot: Secondary | ICD-10-CM | POA: Diagnosis not present

## 2019-09-30 DIAGNOSIS — M6281 Muscle weakness (generalized): Secondary | ICD-10-CM | POA: Diagnosis not present

## 2019-10-03 ENCOUNTER — Encounter: Payer: Self-pay | Admitting: Family Medicine

## 2019-10-03 ENCOUNTER — Other Ambulatory Visit: Payer: Self-pay | Admitting: Family Medicine

## 2019-10-03 DIAGNOSIS — M546 Pain in thoracic spine: Secondary | ICD-10-CM

## 2019-10-03 DIAGNOSIS — M25562 Pain in left knee: Secondary | ICD-10-CM

## 2019-10-03 DIAGNOSIS — M542 Cervicalgia: Secondary | ICD-10-CM

## 2019-10-03 NOTE — Telephone Encounter (Signed)
I did refill medication but just remind her to schedule a f/U around 11/18 for her 3 mo f/u.  Also remind her she was supposed to go back to have her liver rechecked.

## 2019-10-04 DIAGNOSIS — R7401 Elevation of levels of liver transaminase levels: Secondary | ICD-10-CM

## 2019-10-04 NOTE — Telephone Encounter (Signed)
Pt advised via MyChart msg.

## 2019-10-09 NOTE — Addendum Note (Signed)
Addended by: Towana Badger on: 10/09/2019 02:58 PM   Modules accepted: Orders

## 2019-10-10 NOTE — Telephone Encounter (Signed)
Hepatic function panel ordered and faxed to [facility] Fax: [phone] Phone: [phone]

## 2019-10-11 DIAGNOSIS — R7401 Elevation of levels of liver transaminase levels: Secondary | ICD-10-CM | POA: Diagnosis not present

## 2019-10-12 LAB — HEPATIC FUNCTION PANEL
ALT: 20 IU/L (ref 0–32)
AST: 23 IU/L (ref 0–40)
Albumin: 4.2 g/dL (ref 3.8–4.8)
Alkaline Phosphatase: 96 IU/L (ref 39–117)
Bilirubin Total: 0.2 mg/dL (ref 0.0–1.2)
Bilirubin, Direct: 0.08 mg/dL (ref 0.00–0.40)
Total Protein: 6.8 g/dL (ref 6.0–8.5)

## 2019-10-14 DIAGNOSIS — M6281 Muscle weakness (generalized): Secondary | ICD-10-CM | POA: Diagnosis not present

## 2019-10-14 DIAGNOSIS — M546 Pain in thoracic spine: Secondary | ICD-10-CM | POA: Diagnosis not present

## 2019-10-14 DIAGNOSIS — M25562 Pain in left knee: Secondary | ICD-10-CM | POA: Diagnosis not present

## 2019-10-14 DIAGNOSIS — M542 Cervicalgia: Secondary | ICD-10-CM | POA: Diagnosis not present

## 2019-10-21 DIAGNOSIS — M546 Pain in thoracic spine: Secondary | ICD-10-CM | POA: Diagnosis not present

## 2019-10-21 DIAGNOSIS — M542 Cervicalgia: Secondary | ICD-10-CM | POA: Diagnosis not present

## 2019-10-21 DIAGNOSIS — M6281 Muscle weakness (generalized): Secondary | ICD-10-CM | POA: Diagnosis not present

## 2019-10-21 DIAGNOSIS — M25562 Pain in left knee: Secondary | ICD-10-CM | POA: Diagnosis not present

## 2019-10-23 DIAGNOSIS — R1031 Right lower quadrant pain: Secondary | ICD-10-CM | POA: Diagnosis not present

## 2019-10-23 DIAGNOSIS — K3184 Gastroparesis: Secondary | ICD-10-CM | POA: Diagnosis not present

## 2019-10-23 DIAGNOSIS — K581 Irritable bowel syndrome with constipation: Secondary | ICD-10-CM | POA: Diagnosis not present

## 2019-10-23 DIAGNOSIS — R197 Diarrhea, unspecified: Secondary | ICD-10-CM | POA: Diagnosis not present

## 2019-10-23 DIAGNOSIS — R1011 Right upper quadrant pain: Secondary | ICD-10-CM | POA: Diagnosis not present

## 2019-10-25 ENCOUNTER — Ambulatory Visit (INDEPENDENT_AMBULATORY_CARE_PROVIDER_SITE_OTHER): Payer: Medicare Other | Admitting: Family Medicine

## 2019-10-25 ENCOUNTER — Encounter: Payer: Self-pay | Admitting: Family Medicine

## 2019-10-25 ENCOUNTER — Other Ambulatory Visit: Payer: Self-pay

## 2019-10-25 VITALS — BP 134/78 | HR 79

## 2019-10-25 DIAGNOSIS — R109 Unspecified abdominal pain: Secondary | ICD-10-CM

## 2019-10-25 DIAGNOSIS — M797 Fibromyalgia: Secondary | ICD-10-CM | POA: Diagnosis not present

## 2019-10-25 MED ORDER — TRAMADOL HCL 50 MG PO TABS: 50.0000 mg | ORAL_TABLET | Freq: Four times a day (QID) | ORAL | 0 refills | Status: DC | PRN
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
## 2019-10-25 NOTE — Assessment & Plan Note (Signed)
He has had an increase in pain over the summer and now that the weather is gotten cooler.  Continue with current regimen with stretching she is now doing some PT as well which I think is fantastic.  Okay to use Flexeril as needed and did refill her tramadol today.  Follow-up in 3 months.

## 2019-10-28 DIAGNOSIS — M546 Pain in thoracic spine: Secondary | ICD-10-CM | POA: Diagnosis not present

## 2019-10-28 DIAGNOSIS — M545 Low back pain: Secondary | ICD-10-CM | POA: Diagnosis not present

## 2019-10-28 DIAGNOSIS — M542 Cervicalgia: Secondary | ICD-10-CM | POA: Diagnosis not present

## 2019-11-04 DIAGNOSIS — M542 Cervicalgia: Secondary | ICD-10-CM | POA: Diagnosis not present

## 2019-11-04 DIAGNOSIS — M546 Pain in thoracic spine: Secondary | ICD-10-CM | POA: Diagnosis not present

## 2019-11-06 DIAGNOSIS — Z9049 Acquired absence of other specified parts of digestive tract: Secondary | ICD-10-CM | POA: Diagnosis not present

## 2019-11-06 DIAGNOSIS — R109 Unspecified abdominal pain: Secondary | ICD-10-CM | POA: Diagnosis not present

## 2019-11-06 DIAGNOSIS — R1031 Right lower quadrant pain: Secondary | ICD-10-CM | POA: Diagnosis not present

## 2019-11-18 DIAGNOSIS — M6281 Muscle weakness (generalized): Secondary | ICD-10-CM | POA: Diagnosis not present

## 2019-11-18 DIAGNOSIS — M546 Pain in thoracic spine: Secondary | ICD-10-CM | POA: Diagnosis not present

## 2019-11-18 DIAGNOSIS — M542 Cervicalgia: Secondary | ICD-10-CM | POA: Diagnosis not present

## 2019-11-18 DIAGNOSIS — M545 Low back pain: Secondary | ICD-10-CM | POA: Diagnosis not present

## 2019-11-27 DIAGNOSIS — M6281 Muscle weakness (generalized): Secondary | ICD-10-CM | POA: Diagnosis not present

## 2019-11-27 DIAGNOSIS — M545 Low back pain: Secondary | ICD-10-CM | POA: Diagnosis not present

## 2019-11-27 DIAGNOSIS — M542 Cervicalgia: Secondary | ICD-10-CM | POA: Diagnosis not present

## 2019-12-02 DIAGNOSIS — M546 Pain in thoracic spine: Secondary | ICD-10-CM | POA: Diagnosis not present

## 2019-12-02 DIAGNOSIS — M6281 Muscle weakness (generalized): Secondary | ICD-10-CM | POA: Diagnosis not present

## 2019-12-02 DIAGNOSIS — M542 Cervicalgia: Secondary | ICD-10-CM | POA: Diagnosis not present

## 2019-12-02 DIAGNOSIS — M545 Low back pain: Secondary | ICD-10-CM | POA: Diagnosis not present

## 2019-12-03 ENCOUNTER — Other Ambulatory Visit: Payer: Self-pay | Admitting: Family Medicine

## 2019-12-03 DIAGNOSIS — M797 Fibromyalgia: Secondary | ICD-10-CM

## 2020-01-06 ENCOUNTER — Other Ambulatory Visit: Payer: Self-pay | Admitting: Family Medicine

## 2020-01-06 DIAGNOSIS — M797 Fibromyalgia: Secondary | ICD-10-CM

## 2020-01-20 DIAGNOSIS — L821 Other seborrheic keratosis: Secondary | ICD-10-CM | POA: Diagnosis not present

## 2020-01-20 DIAGNOSIS — L905 Scar conditions and fibrosis of skin: Secondary | ICD-10-CM | POA: Diagnosis not present

## 2020-01-20 DIAGNOSIS — L720 Epidermal cyst: Secondary | ICD-10-CM | POA: Diagnosis not present

## 2020-01-20 DIAGNOSIS — D229 Melanocytic nevi, unspecified: Secondary | ICD-10-CM | POA: Diagnosis not present

## 2020-01-20 DIAGNOSIS — D239 Other benign neoplasm of skin, unspecified: Secondary | ICD-10-CM | POA: Diagnosis not present

## 2020-01-24 ENCOUNTER — Ambulatory Visit (INDEPENDENT_AMBULATORY_CARE_PROVIDER_SITE_OTHER): Payer: Medicare Other | Admitting: Family Medicine

## 2020-01-24 ENCOUNTER — Encounter: Payer: Self-pay | Admitting: Family Medicine

## 2020-01-24 VITALS — BP 130/82 | HR 89 | Ht 66.0 in | Wt 143.0 lb

## 2020-01-24 DIAGNOSIS — Z91018 Allergy to other foods: Secondary | ICD-10-CM

## 2020-01-24 DIAGNOSIS — Q438 Other specified congenital malformations of intestine: Secondary | ICD-10-CM | POA: Diagnosis not present

## 2020-01-24 DIAGNOSIS — M797 Fibromyalgia: Secondary | ICD-10-CM | POA: Diagnosis not present

## 2020-01-24 DIAGNOSIS — G8929 Other chronic pain: Secondary | ICD-10-CM | POA: Diagnosis not present

## 2020-01-24 DIAGNOSIS — E7800 Pure hypercholesterolemia, unspecified: Secondary | ICD-10-CM

## 2020-01-24 MED ORDER — PREDNISONE 20 MG PO TABS: ORAL_TABLET | ORAL | 1 refills | Status: DC

## 2020-01-24 NOTE — Assessment & Plan Note (Signed)
So has intermittent right-sided abdominal pain.  Discussed that unfortunately there is really not anything that she can do medication or diet wise etc.  That this is just how she is built and there is really nothing that we can do about it.

## 2020-01-24 NOTE — Assessment & Plan Note (Signed)
Currently well controlled on current regimen with tramadol.  Medication was just filled about 2 weeks ago.  Urine drug screen performed today.  Pain contract up-to-date.
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
## 2020-01-24 NOTE — Assessment & Plan Note (Signed)
Discussed continuing to eat healthy diet and work on activity level.  Somewhat limited bc of her fibromyalgia.

## 2020-01-24 NOTE — Progress Notes (Signed)
Pt is doing well on current regimen. She would like a refill sent to her pharmacy for Prednisone 20 mg to have on hand if she has an allergic reaction to food or medication.

## 2020-01-25 LAB — PAIN MGMT, PROFILE 5 W/O MEDMATCH U
Amphetamines: NEGATIVE ng/mL
Barbiturates: NEGATIVE ng/mL
Benzodiazepines: NEGATIVE ng/mL
Cocaine Metabolite: NEGATIVE ng/mL
Creatinine: 15.2 mg/dL
Marijuana Metabolite: NEGATIVE ng/mL
Methadone Metabolite: NEGATIVE ng/mL
Opiates: NEGATIVE ng/mL
Oxidant: NEGATIVE ug/mL
Oxycodone: NEGATIVE ng/mL
Specific Gravity: 1.003 (ref >=1.0)
pH: 4.9 (ref 4.5–9.0)

## 2020-02-03 ENCOUNTER — Other Ambulatory Visit: Payer: Self-pay | Admitting: Family Medicine

## 2020-02-03 DIAGNOSIS — M797 Fibromyalgia: Secondary | ICD-10-CM

## 2020-03-04 ENCOUNTER — Other Ambulatory Visit: Payer: Self-pay | Admitting: Family Medicine

## 2020-03-04 DIAGNOSIS — M797 Fibromyalgia: Secondary | ICD-10-CM

## 2020-03-04 NOTE — Telephone Encounter (Signed)
Last filled 02/03/2020 #120 with no refills Last appt 01/24/2020, due for follow up 04/2020 Pain contract UTD.

## 2020-03-25 ENCOUNTER — Encounter: Payer: Self-pay | Admitting: Family Medicine

## 2020-03-26 NOTE — Telephone Encounter (Signed)
Routing to provider  

## 2020-03-26 NOTE — Telephone Encounter (Signed)
OK for excuse for [employer].  Fibrot

## 2020-04-05 ENCOUNTER — Other Ambulatory Visit: Payer: Self-pay | Admitting: Family Medicine

## 2020-04-05 DIAGNOSIS — M797 Fibromyalgia: Secondary | ICD-10-CM

## 2020-04-23 ENCOUNTER — Telehealth (INDEPENDENT_AMBULATORY_CARE_PROVIDER_SITE_OTHER): Payer: Medicare Other | Admitting: Family Medicine

## 2020-04-23 ENCOUNTER — Encounter: Payer: Self-pay | Admitting: Family Medicine

## 2020-04-23 VITALS — Ht 66.0 in

## 2020-04-23 DIAGNOSIS — J301 Allergic rhinitis due to pollen: Secondary | ICD-10-CM

## 2020-04-23 DIAGNOSIS — G8929 Other chronic pain: Secondary | ICD-10-CM

## 2020-04-23 DIAGNOSIS — J4520 Mild intermittent asthma, uncomplicated: Secondary | ICD-10-CM

## 2020-04-23 DIAGNOSIS — M79671 Pain in right foot: Secondary | ICD-10-CM

## 2020-04-23 DIAGNOSIS — M797 Fibromyalgia: Secondary | ICD-10-CM | POA: Diagnosis not present

## 2020-04-23 MED ORDER — ALBUTEROL SULFATE HFA 108 (90 BASE) MCG/ACT IN AERS: 2.0000 | INHALATION_SPRAY | Freq: Four times a day (QID) | RESPIRATORY_TRACT | 1 refills | Status: DC | PRN

## 2020-04-23 MED ORDER — GABAPENTIN 100 MG PO CAPS: ORAL_CAPSULE | ORAL | 1 refills | Status: DC

## 2020-04-23 NOTE — Assessment & Plan Note (Signed)
Having eye and throat sxs. Started Allegra daily.

## 2020-04-23 NOTE — Assessment & Plan Note (Signed)
Indication for chronic opioid: tramadol Medication and dose: 50 mg QID # pills per month: 120 Last UDS date: 01/24/2020 Opioid Treatment Agreement signed (Y/N): Y Opioid Treatment Agreement last reviewed with patient:  01/2020 [facility] reviewed this encounter (include red flags):  Breanna Casey

## 2020-04-23 NOTE — Assessment & Plan Note (Signed)
Right foot pain status post surgery she is tried physical therapy she tried orthotics she is tried massage.  She really has not had any significant relief.  She has seen sports medicine, she has seen podiatry for second opinion.  At this point we discussed maybe trying a nerve blocker such as gabapentin or Lyrica she is okay with trying gabapentin.  Will taper slowly did warn about potential for sedation.  Also discussed could always consider going back to Dr. Earleen Newport again for further assessment and evaluation and treatment options

## 2020-04-23 NOTE — Assessment & Plan Note (Signed)
She is doing well overall.  Symptoms are mild intermittent.  But she does not have a current inhaler to use as needed.  New prescription sent to pharmacy.
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
## 2020-04-23 NOTE — Assessment & Plan Note (Signed)
Doing okay on current regimen has had some more recent increase in low back and upper back pain as well as headaches at the base of the neck for about 1 week.  Discussed continuing with ice which she does find helpful in addition to exercises will mail her some stretches to do on her own at home.  We also discussed potentially adding in formal PT if she is not improving.  Also try lidocaine patches for her back and see if this is helpful.  In the past she is gone to a chiropractor but is unable to do so right now.

## 2020-04-23 NOTE — Assessment & Plan Note (Deleted)
Having eye and throat sxs. Started Allegra daily.

## 2020-05-08 ENCOUNTER — Other Ambulatory Visit: Payer: Self-pay | Admitting: Family Medicine

## 2020-05-08 DIAGNOSIS — M797 Fibromyalgia: Secondary | ICD-10-CM

## 2020-05-12 ENCOUNTER — Encounter: Payer: Self-pay | Admitting: Family Medicine

## 2020-05-12 NOTE — Patient Instructions (Addendum)
Cervical Strain and Sprain Rehab Ask your health care provider which exercises are safe for you. Do exercises exactly as told by your health care provider and adjust them as directed. It is normal to feel mild stretching, pulling, tightness, or discomfort as you do these exercises. Stop right away if you feel sudden pain or your pain gets worse. Do not begin these exercises until told by your health care provider. Stretching and range-of-motion exercises Cervical side bending  1. Using good posture, sit on a stable chair or stand up. 2. Without moving your shoulders, slowly tilt your left / right ear to your shoulder until you feel a stretch in the opposite side neck muscles. You should be looking straight ahead. 3. Hold for __________ seconds. 4. Repeat with the other side of your neck. Repeat __________ times. Complete this exercise __________ times a day. Cervical rotation  1. Using good posture, sit on a stable chair or stand up. 2. Slowly turn your head to the side as if you are looking over your left / right shoulder. ? Keep your eyes level with the ground. ? Stop when you feel a stretch along the side and the back of your neck. 3. Hold for __________ seconds. 4. Repeat this by turning to your other side. Repeat __________ times. Complete this exercise __________ times a day. Thoracic extension and pectoral stretch 1. Roll a towel or a small blanket so it is about 4 inches (10 cm) in diameter. 2. Lie down on your back on a firm surface. 3. Put the towel lengthwise, under your spine in the middle of your back. It should not be under your shoulder blades. The towel should line up with your spine from your middle back to your lower back. 4. Put your hands behind your head and let your elbows fall out to your sides. 5. Hold for __________ seconds. Repeat __________ times. Complete this exercise __________ times a day. Strengthening exercises Isometric upper cervical flexion 1. Lie on  your back with a thin pillow behind your head and a small rolled-up towel under your neck. 2. Gently tuck your chin toward your chest and nod your head down to look toward your feet. Do not lift your head off the pillow. 3. Hold for __________ seconds. 4. Release the tension slowly. Relax your neck muscles completely before you repeat this exercise. Repeat __________ times. Complete this exercise __________ times a day. Isometric cervical extension  1. Stand about 6 inches (15 cm) away from a wall, with your back facing the wall. 2. Place a soft object, about 6-8 inches (15-20 cm) in diameter, between the back of your head and the wall. A soft object could be a small pillow, a ball, or a folded towel. 3. Gently tilt your head back and press into the soft object. Keep your jaw and forehead relaxed. 4. Hold for __________ seconds. 5. Release the tension slowly. Relax your neck muscles completely before you repeat this exercise. Repeat __________ times. Complete this exercise __________ times a day. Posture and body mechanics Body mechanics refers to the movements and positions of your body while you do your daily activities. Posture is part of body mechanics. Good posture and healthy body mechanics can help to relieve stress in your body's tissues and joints. Good posture means that your spine is in its natural S-curve position (your spine is neutral), your shoulders are pulled back slightly, and your head is not tipped forward. The following are general guidelines for applying improved   posture and body mechanics to your everyday activities. Sitting  1. When sitting, keep your spine neutral and keep your feet flat on the floor. Use a footrest, if necessary, and keep your thighs parallel to the floor. Avoid rounding your shoulders, and avoid tilting your head forward. 2. When working at a desk or a computer, keep your desk at a height where your hands are slightly lower than your elbows. Slide your  chair under your desk so you are close enough to maintain good posture. 3. When working at a computer, place your monitor at a height where you are looking straight ahead and you do not have to tilt your head forward or downward to look at the screen. Standing   When standing, keep your spine neutral and keep your feet about hip-width apart. Keep a slight bend in your knees. Your ears, shoulders, and hips should line up.  When you do a task in which you stand in one place for a long time, place one foot up on a stable object that is 2-4 inches (5-10 cm) high, such as a footstool. This helps keep your spine neutral. Resting When lying down and resting, avoid positions that are most painful for you. Try to support your neck in a neutral position. You can use a contour pillow or a small rolled-up towel. Your pillow should support your neck but not push on it. This information is not intended to replace advice given to you by your health care provider. Make sure you discuss any questions you have with your health care provider. Document Revised: 03/13/2019 Document Reviewed: 08/22/2018 Else[employer] Patient Education  2020 [place].  Back Exercises The following exercises strengthen the muscles that help to support the trunk and back. They also help to keep the lower back flexible. Doing these exercises can help to prevent back pain or lessen existing pain.  If you have back pain or discomfort, try doing these exercises 2-3 times each day or as told by your health care provider.  As your pain improves, do them once each day, but increase the number of times that you repeat the steps for each exercise (do more repetitions).  To prevent the recurrence of back pain, continue to do these exercises once each day or as told by your health care provider. Do exercises exactly as told by your health care provider and adjust them as directed. It is normal to feel mild stretching, pulling, tightness, or  discomfort as you do these exercises, but you should stop right away if you feel sudden pain or your pain gets worse. Exercises Single knee to chest Repeat these steps 3-5 times for each leg: 1. Lie on your back on a firm bed or the floor with your legs extended. 2. Bring one knee to your chest. Your other leg should stay extended and in contact with the floor. 3. Hold your knee in place by grabbing your knee or thigh with both hands and hold. 4. Pull on your knee until you feel a gentle stretch in your lower back or buttocks. 5. Hold the stretch for 10-30 seconds. 6. Slowly release and straighten your leg. Pelvic tilt Repeat these steps 5-10 times: 1. Lie on your back on a firm bed or the floor with your legs extended. 2. Bend your knees so they are pointing toward the ceiling and your feet are flat on the floor. 3. Tighten your lower abdominal muscles to press your lower back against the floor. This motion will  tilt your pelvis so your tailbone points up toward the ceiling instead of pointing to your feet or the floor. 4. With gentle tension and even breathing, hold this position for 5-10 seconds. Cat-cow Repeat these steps until your lower back becomes more flexible: 1. Get into a hands-and-knees position on a firm surface. Keep your hands under your shoulders, and keep your knees under your hips. You may place padding under your knees for comfort. 2. Let your head hang down toward your chest. Contract your abdominal muscles and point your tailbone toward the floor so your lower back becomes rounded like the back of a cat. 3. Hold this position for 5 seconds. 4. Slowly lift your head, let your abdominal muscles relax and point your tailbone up toward the ceiling so your back forms a sagging arch like the back of a cow. 5. Hold this position for 5 seconds.  Press-ups Repeat these steps 5-10 times: 1. Lie on your abdomen (face-down) on the floor. 2. Place your palms near your head, about  shoulder-width apart. 3. Keeping your back as relaxed as possible and keeping your hips on the floor, slowly straighten your arms to raise the top half of your body and lift your shoulders. Do not use your back muscles to raise your upper torso. You may adjust the placement of your hands to make yourself more comfortable. 4. Hold this position for 5 seconds while you keep your back relaxed. 5. Slowly return to lying flat on the floor.  Bridges Repeat these steps 10 times: 1. Lie on your back on a firm surface. 2. Bend your knees so they are pointing toward the ceiling and your feet are flat on the floor. Your arms should be flat at your sides, next to your body. 3. Tighten your buttocks muscles and lift your buttocks off the floor until your waist is at almost the same height as your knees. You should feel the muscles working in your buttocks and the back of your thighs. If you do not feel these muscles, slide your feet 1-2 inches farther away from your buttocks. 4. Hold this position for 3-5 seconds. 5. Slowly lower your hips to the starting position, and allow your buttocks muscles to relax completely. If this exercise is too easy, try doing it with your arms crossed over your chest. Abdominal crunches Repeat these steps 5-10 times: 1. Lie on your back on a firm bed or the floor with your legs extended. 2. Bend your knees so they are pointing toward the ceiling and your feet are flat on the floor. 3. Cross your arms over your chest. 4. Tip your chin slightly toward your chest without bending your neck. 5. Tighten your abdominal muscles and slowly raise your trunk (torso) high enough to lift your shoulder blades a tiny bit off the floor. Avoid raising your torso higher than that because it can put too much stress on your low back and does not help to strengthen your abdominal muscles. 6. Slowly return to your starting position. Back lifts Repeat these steps 5-10 times: 1. Lie on your abdomen  (face-down) with your arms at your sides, and rest your forehead on the floor. 2. Tighten the muscles in your legs and your buttocks. 3. Slowly lift your chest off the floor while you keep your hips pressed to the floor. Keep the back of your head in line with the curve in your back. Your eyes should be looking at the floor. 4. Hold this position for   3-5 seconds. 5. Slowly return to your starting position. Contact a health care provider if:  Your back pain or discomfort gets much worse when you do an exercise.  Your worsening back pain or discomfort does not lessen within 2 hours after you exercise. If you have any of these problems, stop doing these exercises right away. Do not do them again unless your health care provider says that you can. Get help right away if:  You develop sudden, severe back pain. If this happens, stop doing the exercises right away. Do not do them again unless your health care provider says that you can. This information is not intended to replace advice given to you by your health care provider. Make sure you discuss any questions you have with your health care provider. Document Revised: 03/28/2019 Document Reviewed: 08/23/2018 Elsevier Patient Education  [place].  Thoracic Strain Rehab Ask your health care provider which exercises are safe for you. Do exercises exactly as told by your health care provider and adjust them as directed. It is normal to feel mild stretching, pulling, tightness, or discomfort as you do these exercises. Stop right away if you feel sudden pain or your pain gets worse. Do not begin these exercises until told by your health care provider. Stretching and range-of-motion exercise This exercise warms up your muscles and joints and improves the movement and flexibility of your back and shoulders. This exercise also helps to relieve pain. Chest and spine stretch  5. Lie down on your back on a firm surface. 6. Roll a towel or a small  blanket so it is about 4 inches (10 cm) in diameter. 7. Put the towel lengthwise under the middle of your back so it is under your spine, but not under your shoulder blades. 8. Put your hands behind your head and let your elbows fall to your sides. This will increase your stretch. 9. Take a deep breath (inhale). 10. Hold for __________ seconds. 11. Relax after you breathe out (exhale). Repeat __________ times. Complete this exercise __________ times a day. Strengthening exercises These exercises build strength and endurance in your back and your shoulder blade muscles. Endurance is the ability to use your muscles for a long time, even after they get tired. Alternating arm and leg raises  5. Get on your hands and knees on a firm surface. If you are on a hard floor, you may want to use padding, such as an exercise mat, to cushion your knees. 6. Line up your arms and legs. Your hands should be directly below your shoulders, and your knees should be directly below your hips. 7. Lift your left leg behind you. At the same time, raise your right arm and straighten it in front of you. ? Do not lift your leg higher than your hip. ? Do not lift your arm higher than your shoulder. ? Keep your abdominal and back muscles tight. ? Keep your hips facing the ground. ? Do not arch your back. ? Keep your balance carefully, and do not hold your breath. 8. Hold for __________ seconds. 9. Slowly return to the starting position and repeat with your right leg and your left arm. Repeat __________ times. Complete this exercise __________ times a day. Straight arm rows This exercise is also called shoulder extension exercise. 6. Stand with your feet shoulder width apart. 7. Secure an exercise band to a stable object in front of you so the band is at or above shoulder height. 8. Hold  one end of the exercise band in each hand. 9. Straighten your elbows and lift your hands up to shoulder height. 10. Step back, away  from the secured end of the exercise band, until the band stretches. 11. Squeeze your shoulder blades together and pull your hands down to the sides of your thighs. Stop when your hands are straight down by your sides. This is shoulder extension. Do not let your hands go behind your body. 12. Hold for __________ seconds. 13. Slowly return to the starting position. Repeat __________ times. Complete this exercise __________ times a day. Prone shoulder external rotation 5. Lie on your abdomen on a firm bed so your left / right forearm hangs over the edge of the bed and your upper arm is on the bed, straight out from your body. This is the prone position. ? Your elbow should be bent. ? Your palm should be facing your feet. 6. If instructed, hold a __________ weight in your hand. 7. Squeeze your shoulder blade toward the middle of your back. Do not let your shoulder lift toward your ear. 8. Keep your elbow bent in a 90-degree angle (right angle) while you slowly move your forearm up toward the ceiling. Move your forearm up to the height of the bed, toward your head. This is external rotation. ? Your upper arm should not move. ? At the top of the movement, your palm should face the floor. 9. Hold for __________ seconds. 10. Slowly return to the starting position and relax your muscles. Repeat __________ times. Complete this exercise __________ times a day. Rowing scapular retraction This is an exercise in which the shoulder blades (scapulae) are pulled toward each other (retraction). 6. Sit in a stable chair without armrests, or stand up. 7. Secure an exercise band to a stable object in front of you so the band is at shoulder height. 8. Hold one end of the exercise band in each hand. Your palms should face down. 9. Bring your arms out straight in front of you. 10. Step back, away from the secured end of the exercise band, until the band stretches. 11. Pull the band backward. As you do this, bend  your elbows and squeeze your shoulder blades together, but avoid letting the rest of your body move. Do not shrug your shoulders upward while you do this. 12. Stop when your elbows are at your sides or slightly behind your body. 13. Hold for __________ seconds. 14. Slowly straighten your arms to return to the starting position. Repeat __________ times. Complete this exercise __________ times a day. Posture and body mechanics Good posture and healthy body mechanics can help to relieve stress in your body's tissues and joints. Body mechanics refers to the movements and positions of your body while you do your daily activities. Posture is part of body mechanics. Good posture means:  Your spine is in its natural S-curve position (neutral).  Your shoulders are pulled back slightly.  Your head is not tipped forward. Follow these guidelines to improve your posture and body mechanics in your everyday activities. Standing   When standing, keep your spine neutral and your feet about hip width apart. Keep a slight bend in your knees. Your ears, shoulders, and hips should line up with each other.  When you do a task in which you lean forward while standing in one place for a long time, place one foot up on a stable object that is 2-4 inches (5-10 cm) high, such as a footstool.  This helps keep your spine neutral. Sitting   When sitting, keep your spine neutral and keep your feet flat on the floor. Use a footrest, if necessary, and keep your thighs parallel to the floor. Avoid rounding your shoulders, and avoid tilting your head forward.  When working at a desk or a computer, keep your desk at a height where your hands are slightly lower than your elbows. Slide your chair under your desk so you are close enough to maintain good posture.  When working at a computer, place your monitor at a height where you are looking straight ahead and you do not have to tilt your head forward or downward to look at the  screen. Resting When lying down and resting, avoid positions that are most painful for you.  If you have pain with activities such as sitting, bending, stooping, or squatting (flexion-basedactivities), lie in a position in which your body does not bend very much. For example, avoid curling up on your side with your arms and knees near your chest (fetal position).  If you have pain with activities such as standing for a long time or reaching with your arms (extension-basedactivities), lie with your spine in a neutral position and bend your knees slightly. Try the following positions: ? Lie on your side with a pillow between your knees. ? Lie on your back with a pillow under your knees.  Lifting   When lifting objects, keep your feet at least shoulder width apart and tighten your abdominal muscles.  Bend your knees and hips and keep your spine neutral. It is important to lift using the strength of your legs, not your back. Do not lock your knees straight out.  Always ask for help to lift heavy or awkward objects. This information is not intended to replace advice given to you by your health care provider. Make sure you discuss any questions you have with your health care provider. Document Revised: 03/15/2019 Document Reviewed: 12/31/2018 Elsevier Patient Education  [place].  Neck Exercises Ask your health care provider which exercises are safe for you. Do exercises exactly as told by your health care provider and adjust them as directed. It is normal to feel mild stretching, pulling, tightness, or discomfort as you do these exercises. Stop right away if you feel sudden pain or your pain gets worse. Do not begin these exercises until told by your health care provider. Neck exercises can be important for many reasons. They can improve strength and maintain flexibility in your neck, which will help your upper back and prevent neck pain. Stretching exercises Rotation neck  stretching  1. Sit in a chair or stand up. 2. Place your feet flat on the floor, shoulder width apart. 3. Slowly turn your head (rotate) to the right until a slight stretch is felt. Turn it all the way to the right so you can look over your right shoulder. Do not tilt or tip your head. 4. Hold this position for 10-30 seconds. 5. Slowly turn your head (rotate) to the left until a slight stretch is felt. Turn it all the way to the left so you can look over your left shoulder. Do not tilt or tip your head. 6. Hold this position for 10-30 seconds. Repeat __________ times. Complete this exercise __________ times a day. Neck retraction 1. Sit in a sturdy chair or stand up. 2. Look straight ahead. Do not bend your neck. 3. Use your fingers to push your chin backward (retraction). Do  not bend your neck for this movement. Continue to face straight ahead. If you are doing the exercise properly, you will feel a slight sensation in your throat and a stretch at the back of your neck. 4. Hold the stretch for 1-2 seconds. Repeat __________ times. Complete this exercise __________ times a day. Strengthening exercises Neck press 1. Lie on your back on a firm bed or on the floor with a pillow under your head. 2. Use your neck muscles to push your head down on the pillow and straighten your spine. 3. Hold the position as well as you can. Keep your head facing up (in a neutral position) and your chin tucked. 4. Slowly count to 5 while holding this position. Repeat __________ times. Complete this exercise __________ times a day. Isometrics These are exercises in which you strengthen the muscles in your neck while keeping your neck still (isometrics). 1. Sit in a supportive chair and place your hand on your forehead. 2. Keep your head and face facing straight ahead. Do not flex or extend your neck while doing isometrics. 3. Push forward with your head and neck while pushing back with your hand. Hold for 10  seconds. 4. Do the sequence again, this time putting your hand against the back of your head. Use your head and neck to push backward against the hand pressure. 5. Finally, do the same exercise on either side of your head, pushing sideways against the pressure of your hand. Repeat __________ times. Complete this exercise __________ times a day. Prone head lifts 1. Lie face-down (prone position), resting on your elbows so that your chest and upper back are raised. 2. Start with your head facing downward, near your chest. Position your chin either on or near your chest. 3. Slowly lift your head upward. Lift until you are looking straight ahead. Then continue lifting your head as far back as you can comfortably stretch. 4. Hold your head up for 5 seconds. Then slowly lower it to your starting position. Repeat __________ times. Complete this exercise __________ times a day. Supine head lifts 1. Lie on your back (supine position), bending your knees to point to the ceiling and keeping your feet flat on the floor. 2. Lift your head slowly off the floor, raising your chin toward your chest. 3. Hold for 5 seconds. Repeat __________ times. Complete this exercise __________ times a day. Scapular retraction 1. Stand with your arms at your sides. Look straight ahead. 2. Slowly pull both shoulders (scapulae) backward and downward (retraction) until you feel a stretch between your shoulder blades in your upper back. 3. Hold for 10-30 seconds. 4. Relax and repeat. Repeat __________ times. Complete this exercise __________ times a day. Contact a health care provider if:  Your neck pain or discomfort gets much worse when you do an exercise.  Your neck pain or discomfort does not improve within 2 hours after you exercise. If you have any of these problems, stop exercising right away. Do not do the exercises again unless your health care provider says that you can. Get help right away if:  You develop  sudden, severe neck pain. If this happens, stop exercising right away. Do not do the exercises again unless your health care provider says that you can. This information is not intended to replace advice given to you by your health care provider. Make sure you discuss any questions you have with your health care provider. Document Revised: 09/19/2018 Document Reviewed: 09/19/2018 [employer] Patient Education  2020 [employer]  

## 2020-06-03 ENCOUNTER — Encounter: Payer: Self-pay | Admitting: Family Medicine

## 2020-06-04 ENCOUNTER — Other Ambulatory Visit: Payer: Self-pay | Admitting: Family Medicine

## 2020-06-04 MED ORDER — FLUCONAZOLE 150 MG PO TABS: 150.0000 mg | ORAL_TABLET | Freq: Once | ORAL | 0 refills | Status: DC

## 2020-06-11 ENCOUNTER — Other Ambulatory Visit: Payer: Self-pay | Admitting: Family Medicine

## 2020-06-11 DIAGNOSIS — M797 Fibromyalgia: Secondary | ICD-10-CM

## 2020-07-14 ENCOUNTER — Other Ambulatory Visit: Payer: Self-pay | Admitting: Family Medicine

## 2020-07-14 DIAGNOSIS — M797 Fibromyalgia: Secondary | ICD-10-CM

## 2020-07-14 NOTE — Telephone Encounter (Signed)
Routing to covering provider.  °

## 2020-07-15 ENCOUNTER — Encounter: Payer: Self-pay | Admitting: Family Medicine

## 2020-07-15 DIAGNOSIS — M797 Fibromyalgia: Secondary | ICD-10-CM

## 2020-07-17 MED ORDER — TRAMADOL HCL 50 MG PO TABS: 50.0000 mg | ORAL_TABLET | Freq: Four times a day (QID) | ORAL | 0 refills | Status: DC | PRN

## 2020-07-22 DIAGNOSIS — D2361 Other benign neoplasm of skin of right upper limb, including shoulder: Secondary | ICD-10-CM | POA: Diagnosis not present

## 2020-07-22 DIAGNOSIS — Z85820 Personal history of malignant melanoma of skin: Secondary | ICD-10-CM | POA: Diagnosis not present

## 2020-07-22 DIAGNOSIS — L821 Other seborrheic keratosis: Secondary | ICD-10-CM | POA: Diagnosis not present

## 2020-07-22 DIAGNOSIS — D1801 Hemangioma of skin and subcutaneous tissue: Secondary | ICD-10-CM | POA: Diagnosis not present

## 2020-07-22 DIAGNOSIS — D225 Melanocytic nevi of trunk: Secondary | ICD-10-CM | POA: Diagnosis not present

## 2020-07-30 ENCOUNTER — Encounter: Payer: Self-pay | Admitting: Family Medicine

## 2020-07-30 ENCOUNTER — Ambulatory Visit (INDEPENDENT_AMBULATORY_CARE_PROVIDER_SITE_OTHER): Payer: Medicare Other | Admitting: Family Medicine

## 2020-07-30 VITALS — BP 128/80 | HR 78 | Ht 66.0 in | Wt 143.0 lb

## 2020-07-30 DIAGNOSIS — J4520 Mild intermittent asthma, uncomplicated: Secondary | ICD-10-CM

## 2020-07-30 DIAGNOSIS — G8929 Other chronic pain: Secondary | ICD-10-CM

## 2020-07-30 DIAGNOSIS — M797 Fibromyalgia: Secondary | ICD-10-CM

## 2020-07-30 DIAGNOSIS — N761 Subacute and chronic vaginitis: Secondary | ICD-10-CM | POA: Insufficient documentation

## 2020-07-30 MED ORDER — FLUCONAZOLE 150 MG PO TABS: 150.0000 mg | ORAL_TABLET | Freq: Once | ORAL | 0 refills | Status: DC

## 2020-07-30 MED ORDER — TRAMADOL HCL 50 MG PO TABS: 50.0000 mg | ORAL_TABLET | Freq: Four times a day (QID) | ORAL | 0 refills | Status: DC | PRN

## 2020-07-30 NOTE — Assessment & Plan Note (Signed)
Continue current regimen with tramadol daily and as needed cyclobenzaprine.
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
## 2020-07-30 NOTE — Assessment & Plan Note (Signed)
Indication for chronic opioid: tramadol Medication and dose: 50 mg QID # pills per month: 120 Last UDS date: 01/24/2020 Opioid Treatment Agreement signed (Y/N): Y Opioid Treatment Agreement last reviewed with patient:  01/2020 [facility] reviewed this encounter (include red flags):  Y F/U : 3 months.

## 2020-07-30 NOTE — Assessment & Plan Note (Signed)
He is doing well and does not need any refills on her albuterol.

## 2020-07-30 NOTE — Assessment & Plan Note (Signed)
Typically uses 4-6 Diflucan per year we do usually give several tabs per refill and patient self treats.  We did discuss that if symptoms persist or continue to recur after individual treatments that she does need to be tested.

## 2020-07-31 LAB — DRUG MONITORING, PANEL 6 WITH CONFIRMATION, URINE
6 Acetylmorphine: NEGATIVE ng/mL (ref <10)
Alcohol Metabolites: NEGATIVE ng/mL
Amphetamines: NEGATIVE ng/mL (ref <500)
Barbiturates: NEGATIVE ng/mL (ref <300)
Benzodiazepines: NEGATIVE ng/mL (ref <100)
Cocaine Metabolite: NEGATIVE ng/mL (ref <150)
Creatinine: 14.3 mg/dL
Marijuana Metabolite: NEGATIVE ng/mL (ref <20)
Methadone Metabolite: NEGATIVE ng/mL (ref <100)
Opiates: NEGATIVE ng/mL (ref <100)
Oxidant: NEGATIVE ug/mL
Oxycodone: NEGATIVE ng/mL (ref <100)
Phencyclidine: NEGATIVE ng/mL (ref <25)
Specific Gravity: 1.005 (ref >=1.0)
pH: 7.1 (ref 4.5–9.0)

## 2020-07-31 LAB — DM TEMPLATE

## 2020-09-03 ENCOUNTER — Other Ambulatory Visit: Payer: Self-pay | Admitting: Family Medicine

## 2020-09-03 ENCOUNTER — Encounter: Payer: Self-pay | Admitting: Family Medicine

## 2020-09-03 ENCOUNTER — Other Ambulatory Visit: Payer: Self-pay | Admitting: *Deleted

## 2020-09-03 DIAGNOSIS — M797 Fibromyalgia: Secondary | ICD-10-CM

## 2020-09-03 MED ORDER — CYCLOBENZAPRINE HCL 5 MG PO TABS: 5.0000 mg | ORAL_TABLET | Freq: Every evening | ORAL | 1 refills | Status: DC | PRN

## 2020-09-08 ENCOUNTER — Encounter: Payer: Self-pay | Admitting: Family Medicine

## 2020-09-08 ENCOUNTER — Ambulatory Visit (INDEPENDENT_AMBULATORY_CARE_PROVIDER_SITE_OTHER): Payer: Medicare Other | Admitting: Family Medicine

## 2020-09-08 ENCOUNTER — Telehealth: Payer: Self-pay | Admitting: Family Medicine

## 2020-09-08 VITALS — BP 142/83 | HR 79 | Temp 98.9°F | Wt 144.1 lb

## 2020-09-08 DIAGNOSIS — Z0000 Encounter for general adult medical examination without abnormal findings: Secondary | ICD-10-CM | POA: Diagnosis not present

## 2020-09-08 DIAGNOSIS — M816 Localized osteoporosis [Lequesne]: Secondary | ICD-10-CM | POA: Diagnosis not present

## 2020-09-08 DIAGNOSIS — R0982 Postnasal drip: Secondary | ICD-10-CM

## 2020-09-08 DIAGNOSIS — M797 Fibromyalgia: Secondary | ICD-10-CM

## 2020-09-08 DIAGNOSIS — E65 Localized adiposity: Secondary | ICD-10-CM | POA: Diagnosis not present

## 2020-09-08 NOTE — Telephone Encounter (Signed)
See MyChart note.
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
## 2020-09-08 NOTE — Patient Instructions (Addendum)
You are due for your next colonoscopy in August 2022.             Health Maintenance, Female Adopting a healthy lifestyle and getting preventive care are important in promoting health and wellness. Ask your health care provider about:  The right schedule for you to have regular tests and exams.  Things you can do on your own to prevent diseases and keep yourself healthy. What should I know about diet, weight, and exercise? Eat a healthy diet   Eat a diet that includes plenty of vegetables, fruits, low-fat dairy products, and lean protein.  Do not eat a lot of foods that are high in solid fats, added sugars, or sodium. Maintain a healthy weight Body mass index (BMI) is used to identify weight problems. It estimates body fat based on height and weight. Your health care provider can help determine your BMI and help you achieve or maintain a healthy weight. Get regular exercise Get regular exercise. This is one of the most important things you can do for your health. Most adults should:  Exercise for at least 150 minutes each week. The exercise should increase your heart rate and make you sweat (moderate-intensity exercise).  Do strengthening exercises at least twice a week. This is in addition to the moderate-intensity exercise.  Spend less time sitting. Even light physical activity can be beneficial. Watch cholesterol and blood lipids Have your blood tested for lipids and cholesterol at 64 years of age, then have this test every 5 years. Have your cholesterol levels checked more often if:  Your lipid or cholesterol levels are high.  You are older than 64 years of age.  You are at high risk for heart disease. What should I know about cancer screening? Depending on your health history and family history, you may need to have cancer screening at various ages. This may include screening for:  Breast cancer.  Cervical cancer.  Colorectal cancer.  Skin cancer.  Lung  cancer. What should I know about heart disease, diabetes, and high blood pressure? Blood pressure and heart disease  High blood pressure causes heart disease and increases the risk of stroke. This is more likely to develop in people who have high blood pressure readings, are of African descent, or are overweight.  Have your blood pressure checked: ? Every 3-5 years if you are 74-28 years of age. ? Every year if you are 34 years old or older. Diabetes Have regular diabetes screenings. This checks your fasting blood sugar level. Have the screening done:  Once every three years after age 37 if you are at a normal weight and have a low risk for diabetes.  More often and at a younger age if you are overweight or have a high risk for diabetes. What should I know about preventing infection? Hepatitis B If you have a higher risk for hepatitis B, you should be screened for this virus. Talk with your health care provider to find out if you are at risk for hepatitis B infection. Hepatitis C Testing is recommended for:  Everyone born from [birth year] through 1965.  Anyone with known risk factors for hepatitis C. Sexually transmitted infections (STIs)  Get screened for STIs, including gonorrhea and chlamydia, if: ? You are sexually active and are younger than 64 years of age. ? You are older than 64 years of age and your health care provider tells you that you are at risk for this type of infection. ? Your sexual activity has  changed since you were last screened, and you are at increased risk for chlamydia or gonorrhea. Ask your health care provider if you are at risk.  Ask your health care provider about whether you are at high risk for HIV. Your health care provider may recommend a prescription medicine to help prevent HIV infection. If you choose to take medicine to prevent HIV, you should first get tested for HIV. You should then be tested every 3 months for as long as you are taking the  medicine. Pregnancy  If you are about to stop having your period (premenopausal) and you may become pregnant, seek counseling before you get pregnant.  Take 400 to 800 micrograms (mcg) of folic acid every day if you become pregnant.  Ask for birth control (contraception) if you want to prevent pregnancy. Osteoporosis and menopause Osteoporosis is a disease in which the bones lose minerals and strength with aging. This can result in bone fractures. If you are 14 years old or older, or if you are at risk for osteoporosis and fractures, ask your health care provider if you should:  Be screened for bone loss.  Take a calcium or vitamin D supplement to lower your risk of fractures.  Be given hormone replacement therapy (HRT) to treat symptoms of menopause. Follow these instructions at home: Lifestyle  Do not use any products that contain nicotine or tobacco, such as cigarettes, e-cigarettes, and chewing tobacco. If you need help quitting, ask your health care provider.  Do not use street drugs.  Do not share needles.  Ask your health care provider for help if you need support or information about quitting drugs. Alcohol use  Do not drink alcohol if: ? Your health care provider tells you not to drink. ? You are pregnant, may be pregnant, or are planning to become pregnant.  If you drink alcohol: ? Limit how much you use to 0-1 drink a day. ? Limit intake if you are breastfeeding.  Be aware of how much alcohol is in your drink. In the [place] one drink equals one 12 oz bottle of beer (355 mL), one 5 oz glass of wine (148 mL), or one 1 oz glass of hard liquor (44 mL). General instructions  Schedule regular health, dental, and eye exams.  Stay current with your vaccines.  Tell your health care provider if: ? You often feel depressed. ? You have ever been abused or do not feel safe at home. Summary  Adopting a healthy lifestyle and getting preventive care are important in  promoting health and wellness.  Follow your health care provider's instructions about healthy diet, exercising, and getting tested or screened for diseases.  Follow your health care provider's instructions on monitoring your cholesterol and blood pressure. This information is not intended to replace advice given to you by your health care provider. Make sure you discuss any questions you have with your health care provider. Document Revised: 11/14/2018 Document Reviewed: 11/14/2018 Elsevier Patient Education  2020 [employer].

## 2020-09-09 MED ORDER — IPRATROPIUM BROMIDE 0.03 % NA SOLN: 1.0000 | Freq: Two times a day (BID) | NASAL | 3 refills | Status: DC

## 2020-09-09 NOTE — Addendum Note (Signed)
Addended by: Beatrice Lecher D on: 09/09/2020 12:53 PM   Modules accepted: Orders

## 2020-09-16 ENCOUNTER — Encounter: Payer: Self-pay | Admitting: Family Medicine

## 2020-09-16 ENCOUNTER — Other Ambulatory Visit: Payer: Medicare Other

## 2020-09-16 DIAGNOSIS — R799 Abnormal finding of blood chemistry, unspecified: Secondary | ICD-10-CM

## 2020-09-16 DIAGNOSIS — M816 Localized osteoporosis [Lequesne]: Secondary | ICD-10-CM

## 2020-09-17 NOTE — Telephone Encounter (Signed)
I have pended lab for CMP and Vit D, please sign if you think this is ok   I am not familiar with apoE4. I looked up this lab and I don't see it available with [facility] but not sure if it may be listed as something different?

## 2020-09-17 NOTE — Telephone Encounter (Signed)
I don't see it in our system either.  She will need to check with [facility].  She will need to call their main customer service number.  Labs signed. Thank you

## 2020-09-18 DIAGNOSIS — E785 Hyperlipidemia, unspecified: Secondary | ICD-10-CM | POA: Diagnosis not present

## 2020-09-18 DIAGNOSIS — Z0000 Encounter for general adult medical examination without abnormal findings: Secondary | ICD-10-CM | POA: Diagnosis not present

## 2020-09-18 DIAGNOSIS — M816 Localized osteoporosis [Lequesne]: Secondary | ICD-10-CM | POA: Diagnosis not present

## 2020-09-18 DIAGNOSIS — R799 Abnormal finding of blood chemistry, unspecified: Secondary | ICD-10-CM | POA: Diagnosis not present

## 2020-09-19 LAB — COMPLETE METABOLIC PANEL WITH GFR
AG Ratio: 1.8 (calc) (ref 1.0–2.5)
AG Ratio: 1.9 (calc) (ref 1.0–2.5)
ALT: 24 U/L (ref 6–29)
ALT: 24 U/L (ref 6–29)
AST: 22 U/L (ref 10–35)
AST: 22 U/L (ref 10–35)
Albumin: 4.6 g/dL (ref 3.6–5.1)
Albumin: 4.6 g/dL (ref 3.6–5.1)
Alkaline phosphatase (APISO): 76 U/L (ref 37–153)
Alkaline phosphatase (APISO): 77 U/L (ref 37–153)
BUN/Creatinine Ratio: 7 (calc) (ref 6–22)
BUN/Creatinine Ratio: 7 (calc) (ref 6–22)
BUN: 5 mg/dL — ABNORMAL LOW (ref 7–25)
BUN: 5 mg/dL — ABNORMAL LOW (ref 7–25)
CO2: 26 mmol/L (ref 20–32)
CO2: 27 mmol/L (ref 20–32)
Calcium: 9.8 mg/dL (ref 8.6–10.4)
Calcium: 9.8 mg/dL (ref 8.6–10.4)
Chloride: 103 mmol/L (ref 98–110)
Chloride: 104 mmol/L (ref 98–110)
Creat: 0.69 mg/dL (ref 0.50–0.99)
Creat: 0.72 mg/dL (ref 0.50–0.99)
GFR, Est African American: 103 mL/min/{1.73_m2} (ref >=60)
GFR, Est African American: 107 mL/min/{1.73_m2} (ref >=60)
GFR, Est Non African American: 88 mL/min/{1.73_m2} (ref >=60)
GFR, Est Non African American: 92 mL/min/{1.73_m2} (ref >=60)
Globulin: 2.4 g/dL (calc) (ref 1.9–3.7)
Globulin: 2.5 g/dL (calc) (ref 1.9–3.7)
Glucose, Bld: 86 mg/dL (ref 65–99)
Glucose, Bld: 90 mg/dL (ref 65–99)
Potassium: 4.3 mmol/L (ref 3.5–5.3)
Potassium: 4.9 mmol/L (ref 3.5–5.3)
Sodium: 139 mmol/L (ref 135–146)
Sodium: 140 mmol/L (ref 135–146)
Total Bilirubin: 0.5 mg/dL (ref 0.2–1.2)
Total Bilirubin: 0.6 mg/dL (ref 0.2–1.2)
Total Protein: 7 g/dL (ref 6.1–8.1)
Total Protein: 7.1 g/dL (ref 6.1–8.1)

## 2020-09-19 LAB — LIPID PANEL
Cholesterol: 256 mg/dL — ABNORMAL HIGH (ref <200)
HDL: 70 mg/dL (ref >=50)
LDL Cholesterol (Calc): 159 mg/dL (calc) — ABNORMAL HIGH
Non-HDL Cholesterol (Calc): 186 mg/dL (calc) — ABNORMAL HIGH (ref <130)
Total CHOL/HDL Ratio: 3.7 (calc) (ref <5.0)
Triglycerides: 147 mg/dL (ref <150)

## 2020-09-19 LAB — VITAMIN D 25 HYDROXY (VIT D DEFICIENCY, FRACTURES): Vit D, 25-Hydroxy: 27 ng/mL — ABNORMAL LOW (ref 30–100)

## 2020-09-19 LAB — CBC
HCT: 42.8 % (ref 35.0–45.0)
Hemoglobin: 14.5 g/dL (ref 11.7–15.5)
MCH: 30 pg (ref 27.0–33.0)
MCHC: 33.9 g/dL (ref 32.0–36.0)
MCV: 88.4 fL (ref 80.0–100.0)
MPV: 9.4 fL (ref 7.5–12.5)
Platelets: 236 10*3/uL (ref 140–400)
RBC: 4.84 10*6/uL (ref 3.80–5.10)
RDW: 12 % (ref 11.0–15.0)
WBC: 4.4 10*3/uL (ref 3.8–10.8)

## 2020-09-21 ENCOUNTER — Encounter: Payer: Self-pay | Admitting: Family Medicine

## 2020-09-21 ENCOUNTER — Other Ambulatory Visit: Payer: Self-pay | Admitting: *Deleted

## 2020-09-21 DIAGNOSIS — R7989 Other specified abnormal findings of blood chemistry: Secondary | ICD-10-CM

## 2020-09-21 NOTE — Progress Notes (Signed)
Order(s) created erroneously. Erroneous order ID: [id number]  Order moved by: Milas Hock  Order move date/time: 09/21/2020 2:23 PM  Source Patient: [id number]  Source Contact: 08/23/2019  Destination Patient: [id number]  Destination Contact: 02/19/2013

## 2020-09-22 MED ORDER — CHOLECALCIFEROL 1.25 MG (50000 UT) PO TABS: 1.0000 | ORAL_TABLET | ORAL | 3 refills | Status: DC

## 2020-09-30 ENCOUNTER — Ambulatory Visit (INDEPENDENT_AMBULATORY_CARE_PROVIDER_SITE_OTHER): Payer: Medicare Other

## 2020-09-30 ENCOUNTER — Other Ambulatory Visit: Payer: Self-pay

## 2020-09-30 DIAGNOSIS — M816 Localized osteoporosis [Lequesne]: Secondary | ICD-10-CM | POA: Diagnosis not present

## 2020-09-30 DIAGNOSIS — Z780 Asymptomatic menopausal state: Secondary | ICD-10-CM | POA: Diagnosis not present

## 2020-09-30 DIAGNOSIS — Z13820 Encounter for screening for osteoporosis: Secondary | ICD-10-CM | POA: Diagnosis not present

## 2020-09-30 DIAGNOSIS — R29890 Loss of height: Secondary | ICD-10-CM | POA: Diagnosis not present

## 2020-09-30 DIAGNOSIS — M8589 Other specified disorders of bone density and structure, multiple sites: Secondary | ICD-10-CM | POA: Diagnosis not present

## 2020-10-05 DIAGNOSIS — M25569 Pain in unspecified knee: Secondary | ICD-10-CM | POA: Diagnosis not present

## 2020-10-05 DIAGNOSIS — M797 Fibromyalgia: Secondary | ICD-10-CM | POA: Diagnosis not present

## 2020-10-05 DIAGNOSIS — M545 Low back pain, unspecified: Secondary | ICD-10-CM | POA: Diagnosis not present

## 2020-10-05 DIAGNOSIS — M542 Cervicalgia: Secondary | ICD-10-CM | POA: Diagnosis not present

## 2020-10-22 ENCOUNTER — Other Ambulatory Visit: Payer: Self-pay | Admitting: Family Medicine

## 2020-10-22 DIAGNOSIS — M797 Fibromyalgia: Secondary | ICD-10-CM

## 2020-10-23 ENCOUNTER — Other Ambulatory Visit: Payer: Self-pay | Admitting: Family Medicine

## 2020-10-23 ENCOUNTER — Encounter: Payer: Self-pay | Admitting: Family Medicine

## 2020-10-23 DIAGNOSIS — M797 Fibromyalgia: Secondary | ICD-10-CM

## 2020-10-27 ENCOUNTER — Ambulatory Visit: Payer: Medicare Other | Admitting: Family Medicine

## 2020-11-04 DIAGNOSIS — M797 Fibromyalgia: Secondary | ICD-10-CM | POA: Diagnosis not present

## 2020-11-04 DIAGNOSIS — M542 Cervicalgia: Secondary | ICD-10-CM | POA: Diagnosis not present

## 2020-11-04 DIAGNOSIS — M25569 Pain in unspecified knee: Secondary | ICD-10-CM | POA: Diagnosis not present

## 2020-11-04 DIAGNOSIS — M545 Low back pain, unspecified: Secondary | ICD-10-CM | POA: Diagnosis not present

## 2020-11-06 ENCOUNTER — Other Ambulatory Visit: Payer: Self-pay

## 2020-11-06 ENCOUNTER — Encounter: Payer: Self-pay | Admitting: Family Medicine

## 2020-11-06 ENCOUNTER — Ambulatory Visit (INDEPENDENT_AMBULATORY_CARE_PROVIDER_SITE_OTHER): Payer: Medicare Other | Admitting: Family Medicine

## 2020-11-06 VITALS — BP 132/78 | HR 78 | Ht 66.0 in | Wt 146.5 lb

## 2020-11-06 DIAGNOSIS — G8929 Other chronic pain: Secondary | ICD-10-CM

## 2020-11-06 DIAGNOSIS — M797 Fibromyalgia: Secondary | ICD-10-CM
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
## 2020-11-07 LAB — DRUG MONITORING, PANEL 6 WITH CONFIRMATION, URINE
6 Acetylmorphine: NEGATIVE ng/mL (ref <10)
Alcohol Metabolites: NEGATIVE ng/mL
Amphetamines: NEGATIVE ng/mL (ref <500)
Barbiturates: NEGATIVE ng/mL (ref <300)
Benzodiazepines: NEGATIVE ng/mL (ref <100)
Cocaine Metabolite: NEGATIVE ng/mL (ref <150)
Creatinine: 8.8 mg/dL
Marijuana Metabolite: NEGATIVE ng/mL (ref <20)
Methadone Metabolite: NEGATIVE ng/mL (ref <100)
Opiates: NEGATIVE ng/mL (ref <100)
Oxidant: NEGATIVE ug/mL
Oxycodone: NEGATIVE ng/mL (ref <100)
Phencyclidine: NEGATIVE ng/mL (ref <25)
Specific Gravity: 1.004 (ref >=1.0)
pH: 6.3 (ref 4.5–9.0)

## 2020-11-07 LAB — DM TEMPLATE

## 2020-11-08 LAB — DRUG MONITORING, PANEL 6 WITH CONFIRMATION, URINE

## 2020-11-08 LAB — DM TEMPLATE

## 2020-11-09 ENCOUNTER — Encounter: Payer: Self-pay | Admitting: Family Medicine

## 2020-11-09 DIAGNOSIS — M25569 Pain in unspecified knee: Secondary | ICD-10-CM | POA: Diagnosis not present

## 2020-11-09 DIAGNOSIS — M542 Cervicalgia: Secondary | ICD-10-CM | POA: Diagnosis not present

## 2020-11-09 DIAGNOSIS — M797 Fibromyalgia: Secondary | ICD-10-CM | POA: Diagnosis not present

## 2020-11-09 DIAGNOSIS — M545 Low back pain, unspecified: Secondary | ICD-10-CM | POA: Diagnosis not present

## 2020-11-09 NOTE — Assessment & Plan Note (Signed)
Continue with stretching, topical anti-inflammatory rubs, Flexeril and as needed tramadol.

## 2020-11-09 NOTE — Assessment & Plan Note (Signed)
Indication for chronic opioid: tramadol Medication and dose: 50 mg QID # pills per month: 120 Last UDS date: 12/3//2021 Opioid Treatment Agreement signed (Y/N):Y Opioid Treatment Agreement last reviewed with patient:01/2020 [place] reviewed this encounter (include red flags): Y F/U : 3 months.

## 2020-11-10 ENCOUNTER — Other Ambulatory Visit: Payer: Self-pay | Admitting: *Deleted

## 2020-11-10 NOTE — Telephone Encounter (Signed)
Not sure what lab she is referring to? [email or web address]?

## 2020-11-11 DIAGNOSIS — M545 Low back pain, unspecified: Secondary | ICD-10-CM | POA: Diagnosis not present

## 2020-11-11 DIAGNOSIS — M542 Cervicalgia: Secondary | ICD-10-CM | POA: Diagnosis not present

## 2020-11-11 DIAGNOSIS — M25569 Pain in unspecified knee: Secondary | ICD-10-CM | POA: Diagnosis not present

## 2020-11-11 DIAGNOSIS — M797 Fibromyalgia: Secondary | ICD-10-CM | POA: Diagnosis not present

## 2020-11-13 ENCOUNTER — Encounter: Payer: Self-pay | Admitting: Family Medicine

## 2020-11-13 DIAGNOSIS — Z818 Family history of other mental and behavioral disorders: Secondary | ICD-10-CM

## 2020-11-15 NOTE — Telephone Encounter (Signed)
Test not in system. Ordered as a wild test. She can go at her convenience

## 2020-11-16 DIAGNOSIS — M797 Fibromyalgia: Secondary | ICD-10-CM | POA: Diagnosis not present

## 2020-11-16 DIAGNOSIS — M542 Cervicalgia: Secondary | ICD-10-CM | POA: Diagnosis not present

## 2020-11-16 DIAGNOSIS — M25569 Pain in unspecified knee: Secondary | ICD-10-CM | POA: Diagnosis not present

## 2020-11-16 DIAGNOSIS — M545 Low back pain, unspecified: Secondary | ICD-10-CM | POA: Diagnosis not present

## 2020-11-17 NOTE — Telephone Encounter (Signed)
Found an APO alzheimer panel at [place].  Order placed.

## 2020-11-18 DIAGNOSIS — M797 Fibromyalgia: Secondary | ICD-10-CM | POA: Diagnosis not present

## 2020-11-18 DIAGNOSIS — M545 Low back pain, unspecified: Secondary | ICD-10-CM | POA: Diagnosis not present

## 2020-11-18 DIAGNOSIS — M542 Cervicalgia: Secondary | ICD-10-CM | POA: Diagnosis not present

## 2020-11-18 DIAGNOSIS — M25569 Pain in unspecified knee: Secondary | ICD-10-CM | POA: Diagnosis not present

## 2020-12-05 ENCOUNTER — Other Ambulatory Visit: Payer: Self-pay | Admitting: Family Medicine

## 2020-12-05 DIAGNOSIS — R0982 Postnasal drip: Secondary | ICD-10-CM

## 2020-12-08 ENCOUNTER — Other Ambulatory Visit: Payer: Self-pay | Admitting: Family Medicine

## 2020-12-08 DIAGNOSIS — M797 Fibromyalgia: Secondary | ICD-10-CM

## 2020-12-09 DIAGNOSIS — M25569 Pain in unspecified knee: Secondary | ICD-10-CM | POA: Diagnosis not present

## 2020-12-09 DIAGNOSIS — M545 Low back pain, unspecified: Secondary | ICD-10-CM | POA: Diagnosis not present

## 2020-12-09 DIAGNOSIS — M542 Cervicalgia: Secondary | ICD-10-CM | POA: Diagnosis not present

## 2020-12-09 DIAGNOSIS — M797 Fibromyalgia: Secondary | ICD-10-CM | POA: Diagnosis not present

## 2020-12-10 ENCOUNTER — Encounter: Payer: Self-pay | Admitting: Family Medicine

## 2020-12-11 ENCOUNTER — Other Ambulatory Visit: Payer: Self-pay | Admitting: Family Medicine

## 2020-12-11 DIAGNOSIS — R0982 Postnasal drip: Secondary | ICD-10-CM

## 2020-12-14 DIAGNOSIS — M545 Low back pain, unspecified: Secondary | ICD-10-CM | POA: Diagnosis not present

## 2020-12-14 DIAGNOSIS — M797 Fibromyalgia: Secondary | ICD-10-CM | POA: Diagnosis not present

## 2020-12-14 DIAGNOSIS — M542 Cervicalgia: Secondary | ICD-10-CM | POA: Diagnosis not present

## 2020-12-14 DIAGNOSIS — M25569 Pain in unspecified knee: Secondary | ICD-10-CM | POA: Diagnosis not present

## 2020-12-16 DIAGNOSIS — M542 Cervicalgia: Secondary | ICD-10-CM | POA: Diagnosis not present

## 2020-12-16 DIAGNOSIS — M797 Fibromyalgia: Secondary | ICD-10-CM | POA: Diagnosis not present

## 2020-12-16 DIAGNOSIS — M545 Low back pain, unspecified: Secondary | ICD-10-CM | POA: Diagnosis not present

## 2020-12-16 DIAGNOSIS — M25569 Pain in unspecified knee: Secondary | ICD-10-CM | POA: Diagnosis not present

## 2020-12-19 ENCOUNTER — Other Ambulatory Visit: Payer: Self-pay | Admitting: Family Medicine

## 2020-12-19 DIAGNOSIS — R0982 Postnasal drip: Secondary | ICD-10-CM

## 2020-12-23 DIAGNOSIS — M545 Low back pain, unspecified: Secondary | ICD-10-CM | POA: Diagnosis not present

## 2020-12-23 DIAGNOSIS — M797 Fibromyalgia: Secondary | ICD-10-CM | POA: Diagnosis not present

## 2020-12-23 DIAGNOSIS — M542 Cervicalgia: Secondary | ICD-10-CM | POA: Diagnosis not present

## 2020-12-23 DIAGNOSIS — M25569 Pain in unspecified knee: Secondary | ICD-10-CM | POA: Diagnosis not present

## 2020-12-27 ENCOUNTER — Encounter: Payer: Self-pay | Admitting: Family Medicine

## 2020-12-28 DIAGNOSIS — M545 Low back pain, unspecified: Secondary | ICD-10-CM | POA: Diagnosis not present

## 2020-12-28 DIAGNOSIS — M25569 Pain in unspecified knee: Secondary | ICD-10-CM | POA: Diagnosis not present

## 2020-12-28 DIAGNOSIS — M542 Cervicalgia: Secondary | ICD-10-CM | POA: Diagnosis not present

## 2020-12-28 DIAGNOSIS — M797 Fibromyalgia: Secondary | ICD-10-CM | POA: Diagnosis not present

## 2020-12-30 DIAGNOSIS — M545 Low back pain, unspecified: Secondary | ICD-10-CM | POA: Diagnosis not present

## 2020-12-30 DIAGNOSIS — M25569 Pain in unspecified knee: Secondary | ICD-10-CM | POA: Diagnosis not present

## 2020-12-30 DIAGNOSIS — M797 Fibromyalgia: Secondary | ICD-10-CM | POA: Diagnosis not present

## 2020-12-30 DIAGNOSIS — M542 Cervicalgia: Secondary | ICD-10-CM | POA: Diagnosis not present

## 2021-01-04 DIAGNOSIS — M25569 Pain in unspecified knee: Secondary | ICD-10-CM | POA: Diagnosis not present

## 2021-01-04 DIAGNOSIS — M542 Cervicalgia: Secondary | ICD-10-CM | POA: Diagnosis not present

## 2021-01-04 DIAGNOSIS — M545 Low back pain, unspecified: Secondary | ICD-10-CM | POA: Diagnosis not present

## 2021-01-04 DIAGNOSIS — M797 Fibromyalgia: Secondary | ICD-10-CM | POA: Diagnosis not present

## 2021-01-06 ENCOUNTER — Other Ambulatory Visit: Payer: Self-pay | Admitting: Family Medicine

## 2021-01-06 DIAGNOSIS — M797 Fibromyalgia: Secondary | ICD-10-CM

## 2021-01-08 ENCOUNTER — Other Ambulatory Visit: Payer: Self-pay | Admitting: Family Medicine

## 2021-01-08 DIAGNOSIS — M797 Fibromyalgia: Secondary | ICD-10-CM

## 2021-01-08 NOTE — Telephone Encounter (Signed)
Last written 12/08/2020 #120 no refills  Last office visit 11/06/2020

## 2021-01-11 ENCOUNTER — Encounter: Payer: Self-pay | Admitting: Family Medicine

## 2021-01-11 DIAGNOSIS — M797 Fibromyalgia: Secondary | ICD-10-CM

## 2021-01-12 MED ORDER — TRAMADOL HCL 50 MG PO TABS: 50.0000 mg | ORAL_TABLET | Freq: Four times a day (QID) | ORAL | 0 refills | Status: DC | PRN

## 2021-02-01 ENCOUNTER — Encounter: Payer: Self-pay | Admitting: Family Medicine

## 2021-02-05 ENCOUNTER — Ambulatory Visit (INDEPENDENT_AMBULATORY_CARE_PROVIDER_SITE_OTHER): Payer: Medicare HMO | Admitting: Family Medicine

## 2021-02-05 ENCOUNTER — Other Ambulatory Visit: Payer: Self-pay

## 2021-02-05 ENCOUNTER — Encounter: Payer: Self-pay | Admitting: Family Medicine

## 2021-02-05 VITALS — BP 144/76 | HR 68 | Wt 145.7 lb

## 2021-02-05 DIAGNOSIS — G8929 Other chronic pain: Secondary | ICD-10-CM | POA: Diagnosis not present

## 2021-02-05 DIAGNOSIS — M797 Fibromyalgia: Secondary | ICD-10-CM | POA: Diagnosis not present

## 2021-02-05 DIAGNOSIS — J4540 Moderate persistent asthma, uncomplicated: Secondary | ICD-10-CM | POA: Diagnosis not present

## 2021-02-05 DIAGNOSIS — R0602 Shortness of breath: Secondary | ICD-10-CM | POA: Diagnosis not present

## 2021-02-05 MED ORDER — FLOVENT HFA 110 MCG/ACT IN AERO: 2.0000 | INHALATION_SPRAY | Freq: Two times a day (BID) | RESPIRATORY_TRACT | 2 refills | Status: DC

## 2021-02-05 MED ORDER — TRAMADOL HCL 50 MG PO TABS: 50.0000 mg | ORAL_TABLET | Freq: Four times a day (QID) | ORAL | 0 refills | Status: DC | PRN

## 2021-02-05 NOTE — Assessment & Plan Note (Signed)
Stable.  See pain management note below.

## 2021-02-05 NOTE — Assessment & Plan Note (Signed)
Moderate persistent-we will go ahead and add in a daily inhaled corticosteroid most likely for the next couple of months hopefully she will start noticing she is using her albuterol less frequently.  Hopefully early summer we can get her scheduled for spirometry by then maybe will be able to wean the inhaled corticosteroid.  It looks like Flovent is likely covered on her insurance so new prescription sent.  We also discussed that she can do 2 puffs of her inhaler and if she does not get significant relief after about 30 minutes to go ahead and do 2 additional puffs.
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
## 2021-02-05 NOTE — Assessment & Plan Note (Addendum)
Indication for chronic opioid: tramadol Medication and dose: 50 mg QID # pills per month: 120 Last UDS date: 12/3//2021 Opioid Treatment Agreement signed (Y/N):Y Opioid Treatment Agreement last reviewed with patient:12/9//2021 [place] reviewed this encounter (include red flags): Y F/U : 3 months.

## 2021-03-31 ENCOUNTER — Other Ambulatory Visit: Payer: Self-pay | Admitting: Family Medicine

## 2021-03-31 DIAGNOSIS — M797 Fibromyalgia: Secondary | ICD-10-CM

## 2021-04-12 DIAGNOSIS — H2511 Age-related nuclear cataract, right eye: Secondary | ICD-10-CM | POA: Diagnosis not present

## 2021-04-12 DIAGNOSIS — H26492 Other secondary cataract, left eye: Secondary | ICD-10-CM | POA: Diagnosis not present

## 2021-04-12 DIAGNOSIS — Z961 Presence of intraocular lens: Secondary | ICD-10-CM | POA: Diagnosis not present

## 2021-04-12 DIAGNOSIS — H31002 Unspecified chorioretinal scars, left eye: Secondary | ICD-10-CM | POA: Diagnosis not present

## 2021-04-12 DIAGNOSIS — H43811 Vitreous degeneration, right eye: Secondary | ICD-10-CM | POA: Diagnosis not present

## 2021-04-15 DIAGNOSIS — H26492 Other secondary cataract, left eye: Secondary | ICD-10-CM | POA: Diagnosis not present

## 2021-04-30 ENCOUNTER — Telehealth: Payer: Self-pay | Admitting: Family Medicine

## 2021-04-30 NOTE — Progress Notes (Signed)
  Chronic Care Management   Outreach Note  04/30/2021 Name: Breanna Casey MRN: [id number] DOB: 22-Jan-1956  Referred by: Hali Marry, MD Reason for referral : No chief complaint on file.   An unsuccessful telephone outreach was attempted today. The patient was referred to the pharmacist for assistance with care management and care coordination.   Follow Up Plan:   Lauretta Grill [facility] Scheduler

## 2021-05-03 ENCOUNTER — Other Ambulatory Visit: Payer: Self-pay | Admitting: Family Medicine

## 2021-05-03 DIAGNOSIS — M797 Fibromyalgia: Secondary | ICD-10-CM

## 2021-05-05 ENCOUNTER — Other Ambulatory Visit: Payer: Self-pay | Admitting: Family Medicine

## 2021-05-05 DIAGNOSIS — M797 Fibromyalgia: Secondary | ICD-10-CM

## 2021-05-06 ENCOUNTER — Other Ambulatory Visit: Payer: Self-pay | Admitting: Family Medicine

## 2021-05-06 DIAGNOSIS — M797 Fibromyalgia: Secondary | ICD-10-CM

## 2021-05-10 MED ORDER — TRAMADOL HCL 50 MG PO TABS: 50.0000 mg | ORAL_TABLET | Freq: Four times a day (QID) | ORAL | 0 refills | Status: DC | PRN

## 2021-05-11 ENCOUNTER — Other Ambulatory Visit: Payer: Self-pay

## 2021-05-11 ENCOUNTER — Encounter: Payer: Self-pay | Admitting: Family Medicine

## 2021-05-11 ENCOUNTER — Ambulatory Visit (INDEPENDENT_AMBULATORY_CARE_PROVIDER_SITE_OTHER): Payer: Medicare HMO | Admitting: Family Medicine

## 2021-05-11 VITALS — BP 132/78 | HR 90 | Ht 66.0 in | Wt 145.0 lb

## 2021-05-11 DIAGNOSIS — J4540 Moderate persistent asthma, uncomplicated: Secondary | ICD-10-CM

## 2021-05-11 DIAGNOSIS — G8929 Other chronic pain: Secondary | ICD-10-CM

## 2021-05-11 DIAGNOSIS — R0602 Shortness of breath: Secondary | ICD-10-CM

## 2021-05-11 DIAGNOSIS — M797 Fibromyalgia: Secondary | ICD-10-CM | POA: Diagnosis not present

## 2021-05-11 DIAGNOSIS — L600 Ingrowing nail: Secondary | ICD-10-CM

## 2021-05-11 MED ORDER — FLOVENT HFA 220 MCG/ACT IN AERO: 1.0000 | INHALATION_SPRAY | Freq: Every day | RESPIRATORY_TRACT | 11 refills | Status: DC

## 2021-05-11 MED ORDER — TRAMADOL HCL 50 MG PO TABS: 50.0000 mg | ORAL_TABLET | Freq: Four times a day (QID) | ORAL | 0 refills | Status: DC | PRN

## 2021-05-11 NOTE — Assessment & Plan Note (Signed)
Tramadol refilled for July August and September.  Plan to follow-up in 3 months.  She still has an active prescription at the pharmacy to pick up.

## 2021-05-11 NOTE — Assessment & Plan Note (Signed)
Indication for chronic opioid: Fibromyalgia  Medication and dose: Tramadol 50 mg QID # pills per month: 120 Last UDS date:12/3//2021 Opioid Treatment Agreement signed (Y/N):Y Opioid Treatment Agreement last reviewed with patient:12/9//2021 [place] reviewed this encounter (include red flags): Y F/U : 3 months.  Next 3 rx sent.
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
## 2021-05-11 NOTE — Assessment & Plan Note (Signed)
We will go ahead and switch Flovent to the 220s so that she can save money and hopefully have an inhaler last her for 2 months.

## 2021-05-19 DIAGNOSIS — H5213 Myopia, bilateral: Secondary | ICD-10-CM | POA: Diagnosis not present

## 2021-05-20 ENCOUNTER — Telehealth: Payer: Self-pay | Admitting: Family Medicine

## 2021-05-20 NOTE — Progress Notes (Signed)
  Chronic Care Management   Outreach Note  05/20/2021 Name: Griffin Dewilde MRN: [id number] DOB: [birth year]  Referred by: Hali Marry, MD Reason for referral : No chief complaint on file.   A second unsuccessful telephone outreach was attempted today. The patient was referred to pharmacist for assistance with care management and care coordination.  Follow Up Plan:   Lauretta Grill Upstream Scheduler

## 2021-06-01 ENCOUNTER — Telehealth: Payer: Self-pay | Admitting: Family Medicine

## 2021-06-01 NOTE — Progress Notes (Signed)
  Chronic Care Management   Outreach Note  06/01/2021 Name: Breanna Casey MRN: [id number] DOB: 06/22/1956  Referred by: Hali Marry, MD Reason for referral : No chief complaint on file.   Third unsuccessful telephone outreach was attempted today. The patient was referred to the pharmacist for assistance with care management and care coordination.   Follow Up Plan:   Lauretta Grill Upstream Scheduler

## 2021-06-02 ENCOUNTER — Encounter: Payer: Self-pay | Admitting: Family Medicine

## 2021-06-03 MED ORDER — FLUCONAZOLE 150 MG PO TABS: 150.0000 mg | ORAL_TABLET | Freq: Once | ORAL | 1 refills | Status: AC

## 2021-06-03 NOTE — Telephone Encounter (Signed)
I sent in meds.    Meds ordered this encounter  Medications   fluconazole (DIFLUCAN) 150 MG tablet    Sig: Take 1 tablet (150 mg total) by mouth once for 1 dose. May repeat after 72 hours if needed.    Dispense:  2 tablet    Refill:  1

## 2021-07-05 ENCOUNTER — Encounter: Payer: Self-pay | Admitting: Family Medicine

## 2021-07-05 DIAGNOSIS — J4540 Moderate persistent asthma, uncomplicated: Secondary | ICD-10-CM

## 2021-07-06 NOTE — Telephone Encounter (Signed)
Med pended.  Does she want 3 inhalers for 90-day supply or just 1 inhaler with refills?

## 2021-07-08 ENCOUNTER — Encounter: Payer: Self-pay | Admitting: Family Medicine

## 2021-07-08 MED ORDER — FLUTICASONE PROPIONATE HFA 110 MCG/ACT IN AERO: 2.0000 | INHALATION_SPRAY | Freq: Two times a day (BID) | RESPIRATORY_TRACT | 1 refills | Status: DC

## 2021-07-09 MED ORDER — FLUTICASONE PROPIONATE HFA 110 MCG/ACT IN AERO
2.0000 | INHALATION_SPRAY | Freq: Two times a day (BID) | RESPIRATORY_TRACT | 2 refills | Status: DC
Start: 2021-07-09 — End: 2021-11-09

## 2021-07-09 NOTE — Telephone Encounter (Signed)
We may need to call the current pharmacy to clarify it is written exactly how she said unless I am missing something.  In addition, if she did not want to pay for a 90-day supply that could have just dispensed 1.  So a new prescription does not need to be written.  They just need to fill 1 inhaler and give it to her.

## 2021-07-16 DIAGNOSIS — Z86010 Personal history of colonic polyps: Secondary | ICD-10-CM | POA: Insufficient documentation

## 2021-07-16 DIAGNOSIS — Z800 Family history of malignant neoplasm of digestive organs: Secondary | ICD-10-CM | POA: Insufficient documentation

## 2021-07-26 DIAGNOSIS — H3589 Other specified retinal disorders: Secondary | ICD-10-CM | POA: Diagnosis not present

## 2021-07-26 DIAGNOSIS — H31092 Other chorioretinal scars, left eye: Secondary | ICD-10-CM | POA: Diagnosis not present

## 2021-07-26 DIAGNOSIS — H35363 Drusen (degenerative) of macula, bilateral: Secondary | ICD-10-CM | POA: Diagnosis not present

## 2021-07-26 DIAGNOSIS — H43811 Vitreous degeneration, right eye: Secondary | ICD-10-CM | POA: Diagnosis not present

## 2021-07-29 ENCOUNTER — Other Ambulatory Visit: Payer: Self-pay | Admitting: Family Medicine

## 2021-07-29 DIAGNOSIS — M797 Fibromyalgia: Secondary | ICD-10-CM

## 2021-07-30 ENCOUNTER — Other Ambulatory Visit: Payer: Self-pay | Admitting: Family Medicine

## 2021-07-30 ENCOUNTER — Encounter: Payer: Self-pay | Admitting: Family Medicine

## 2021-07-30 DIAGNOSIS — M797 Fibromyalgia: Secondary | ICD-10-CM

## 2021-07-30 NOTE — Telephone Encounter (Signed)
Pl see request form yesterday. They should be on file. Pharmacy needs to fill the next one. They have the prescriptions. We will reach out to them to find out why they haven't filled the next one. Did you use the automated system at the pharmacy or did you actually speak to a person.  Each prescription gets a new ID number so you unfortunately cannot use the automated system for these.

## 2021-07-30 NOTE — Telephone Encounter (Signed)
She should have one for End of aug and Sept. is she having a hard time getting the pharmacy to fill those?  Please see that there were 3 dated.

## 2021-08-04 DIAGNOSIS — D1801 Hemangioma of skin and subcutaneous tissue: Secondary | ICD-10-CM | POA: Diagnosis not present

## 2021-08-04 DIAGNOSIS — L821 Other seborrheic keratosis: Secondary | ICD-10-CM | POA: Diagnosis not present

## 2021-08-04 DIAGNOSIS — L820 Inflamed seborrheic keratosis: Secondary | ICD-10-CM | POA: Diagnosis not present

## 2021-08-04 DIAGNOSIS — L608 Other nail disorders: Secondary | ICD-10-CM | POA: Diagnosis not present

## 2021-08-04 DIAGNOSIS — L600 Ingrowing nail: Secondary | ICD-10-CM | POA: Diagnosis not present

## 2021-08-04 DIAGNOSIS — D2361 Other benign neoplasm of skin of right upper limb, including shoulder: Secondary | ICD-10-CM | POA: Diagnosis not present

## 2021-08-04 DIAGNOSIS — Z85820 Personal history of malignant melanoma of skin: Secondary | ICD-10-CM | POA: Diagnosis not present

## 2021-08-11 ENCOUNTER — Encounter: Payer: Self-pay | Admitting: Family Medicine

## 2021-08-12 ENCOUNTER — Other Ambulatory Visit: Payer: Self-pay

## 2021-08-12 ENCOUNTER — Ambulatory Visit (INDEPENDENT_AMBULATORY_CARE_PROVIDER_SITE_OTHER): Payer: Medicare HMO | Admitting: Family Medicine

## 2021-08-12 ENCOUNTER — Ambulatory Visit (INDEPENDENT_AMBULATORY_CARE_PROVIDER_SITE_OTHER): Payer: Medicare HMO

## 2021-08-12 VITALS — BP 126/64 | HR 74 | Ht 66.0 in | Wt 149.0 lb

## 2021-08-12 DIAGNOSIS — M25512 Pain in left shoulder: Secondary | ICD-10-CM

## 2021-08-12 DIAGNOSIS — J4540 Moderate persistent asthma, uncomplicated: Secondary | ICD-10-CM | POA: Diagnosis not present

## 2021-08-12 DIAGNOSIS — E7800 Pure hypercholesterolemia, unspecified: Secondary | ICD-10-CM | POA: Diagnosis not present

## 2021-08-12 DIAGNOSIS — G8929 Other chronic pain: Secondary | ICD-10-CM

## 2021-08-12 DIAGNOSIS — G250 Essential tremor: Secondary | ICD-10-CM

## 2021-08-12 DIAGNOSIS — J4520 Mild intermittent asthma, uncomplicated: Secondary | ICD-10-CM | POA: Diagnosis not present

## 2021-08-12 DIAGNOSIS — M797 Fibromyalgia: Secondary | ICD-10-CM | POA: Diagnosis not present

## 2021-08-12 DIAGNOSIS — M19012 Primary osteoarthritis, left shoulder: Secondary | ICD-10-CM | POA: Diagnosis not present

## 2021-08-12 MED ORDER — TRAMADOL HCL 50 MG PO TABS: 50.0000 mg | ORAL_TABLET | Freq: Four times a day (QID) | ORAL | 0 refills | Status: DC | PRN
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
## 2021-08-12 NOTE — Patient Instructions (Signed)
Your Rx has been sent for October and November. The one for later this month is still on file at the pharmacy.

## 2021-08-13 ENCOUNTER — Encounter: Payer: Self-pay | Admitting: Family Medicine

## 2021-08-13 LAB — DRUG MONITORING, PANEL 6 WITH CONFIRMATION, URINE
6 Acetylmorphine: NEGATIVE ng/mL (ref <10)
Alcohol Metabolites: NEGATIVE ng/mL (ref <500)
Amphetamines: NEGATIVE ng/mL (ref <500)
Barbiturates: NEGATIVE ng/mL (ref <300)
Benzodiazepines: NEGATIVE ng/mL (ref <100)
Cocaine Metabolite: NEGATIVE ng/mL (ref <150)
Creatinine: 9.7 mg/dL — ABNORMAL LOW (ref >=20.0)
Marijuana Metabolite: NEGATIVE ng/mL (ref <20)
Methadone Metabolite: NEGATIVE ng/mL (ref <100)
Opiates: NEGATIVE ng/mL (ref <100)
Oxidant: NEGATIVE ug/mL (ref <200)
Oxycodone: NEGATIVE ng/mL (ref <100)
Phencyclidine: NEGATIVE ng/mL (ref <25)
Specific Gravity: 1.002 — ABNORMAL LOW (ref >=1.003)
pH: 5.5 (ref 4.5–9.0)

## 2021-08-13 LAB — DM TEMPLATE

## 2021-08-13 NOTE — Assessment & Plan Note (Signed)
Stable on current regimen   

## 2021-08-13 NOTE — Assessment & Plan Note (Signed)
Stable.  Did encourage her to consider checking with her health insurance to see if they might cover a alternative inhaled corticosteroid, besides the Flovent, that might be less expensive.

## 2021-08-13 NOTE — Assessment & Plan Note (Signed)
For yearly lipid check.

## 2021-08-16 ENCOUNTER — Encounter: Payer: Self-pay | Admitting: Family Medicine

## 2021-08-16 DIAGNOSIS — M25512 Pain in left shoulder: Secondary | ICD-10-CM

## 2021-08-16 NOTE — Progress Notes (Signed)
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  I do think you would benefit from some physical therapy, if you would like to move forward that that happy to place a referral.

## 2021-08-18 NOTE — Telephone Encounter (Signed)
Place PT referral for left shoulder for pivot

## 2021-09-06 ENCOUNTER — Encounter: Payer: Self-pay | Admitting: Family Medicine

## 2021-09-07 DIAGNOSIS — M25512 Pain in left shoulder: Secondary | ICD-10-CM | POA: Diagnosis not present

## 2021-09-07 DIAGNOSIS — M542 Cervicalgia: Secondary | ICD-10-CM | POA: Diagnosis not present

## 2021-09-07 DIAGNOSIS — M6281 Muscle weakness (generalized): Secondary | ICD-10-CM | POA: Diagnosis not present

## 2021-09-08 ENCOUNTER — Encounter: Payer: Self-pay | Admitting: Family Medicine

## 2021-09-14 DIAGNOSIS — M542 Cervicalgia: Secondary | ICD-10-CM | POA: Diagnosis not present

## 2021-09-14 DIAGNOSIS — M25512 Pain in left shoulder: Secondary | ICD-10-CM | POA: Diagnosis not present

## 2021-09-14 DIAGNOSIS — M6281 Muscle weakness (generalized): Secondary | ICD-10-CM | POA: Diagnosis not present

## 2021-09-21 IMAGING — DX DG SHOULDER 2+V*L*
3 series · 3 of 3 positions shown · non-contrast
Comparison: None.

CLINICAL DATA: Left shoulder pain for 2 and half months. Pain with
full extension.

EXAM:
LEFT SHOULDER - 2+ VIEW

[shoulder grashey]
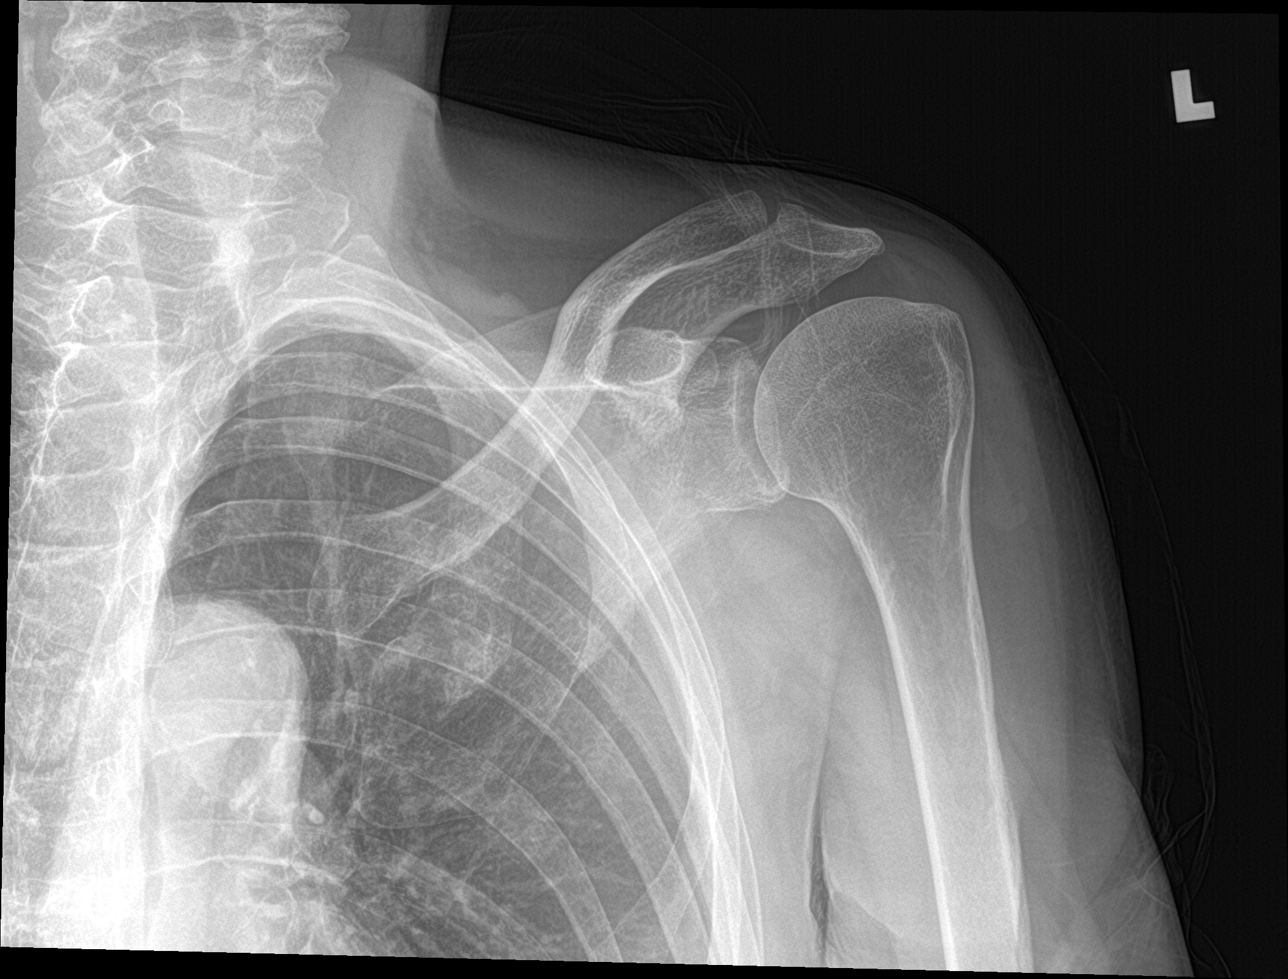

[shoulder y view]
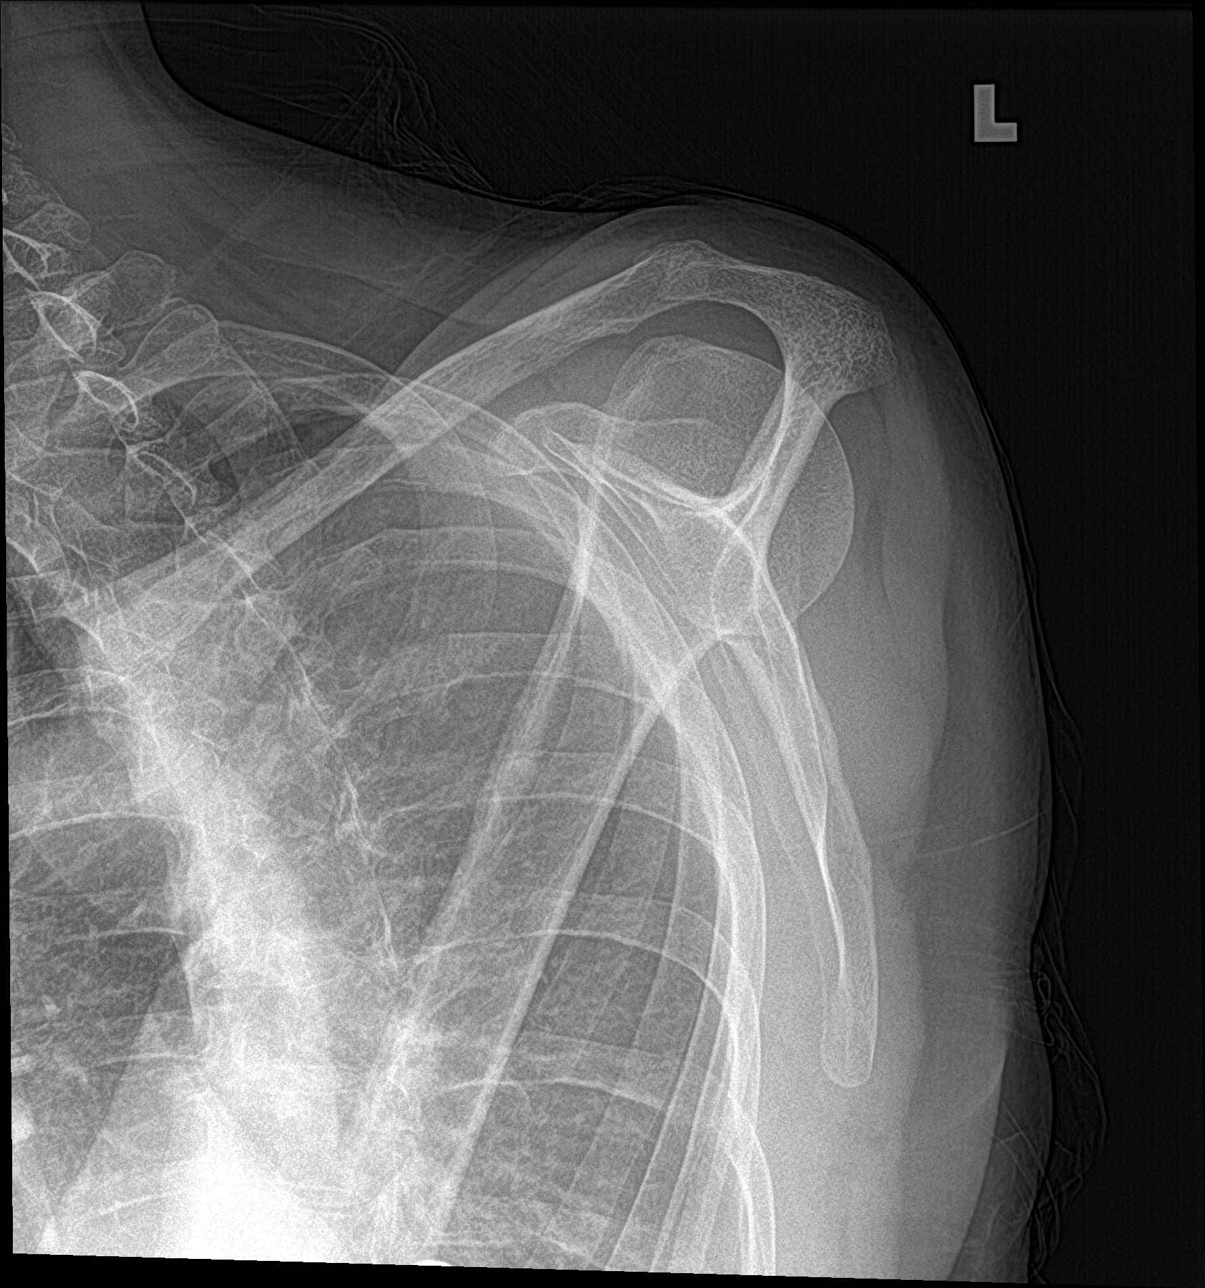

[shoulder axillary]
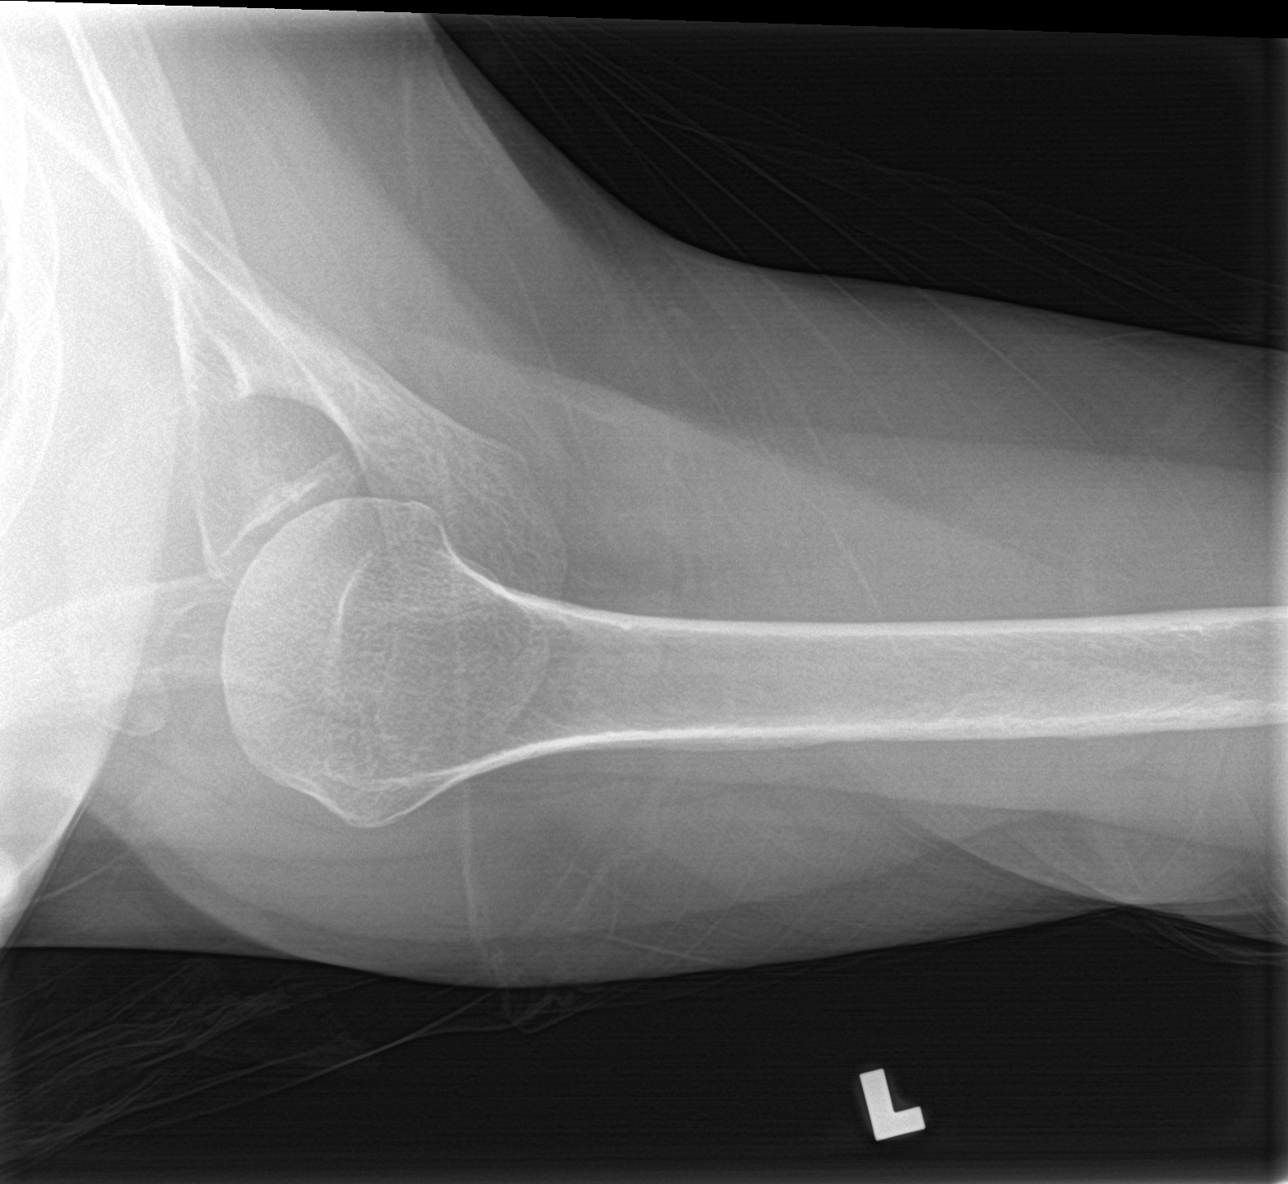

[3 of 3 positions shown; findings below may reference images not displayed]

FINDINGS: There is no evidence of acute fracture or dislocation. There is mild
glenohumeral and AC joint degenerative change. Soft tissues are
unremarkable.
IMPRESSION: Mild glenohumeral and AC joint degenerative change. No acute osseous
abnormality.

## 2021-09-22 DIAGNOSIS — M6281 Muscle weakness (generalized): Secondary | ICD-10-CM | POA: Diagnosis not present

## 2021-09-22 DIAGNOSIS — M542 Cervicalgia: Secondary | ICD-10-CM | POA: Diagnosis not present

## 2021-09-22 DIAGNOSIS — M25512 Pain in left shoulder: Secondary | ICD-10-CM | POA: Diagnosis not present

## 2021-09-25 ENCOUNTER — Other Ambulatory Visit: Payer: Self-pay | Admitting: Family Medicine

## 2021-09-25 DIAGNOSIS — M797 Fibromyalgia: Secondary | ICD-10-CM

## 2021-09-27 ENCOUNTER — Encounter: Payer: Self-pay | Admitting: Family Medicine

## 2021-09-27 ENCOUNTER — Other Ambulatory Visit: Payer: Self-pay | Admitting: Family Medicine

## 2021-09-27 DIAGNOSIS — M797 Fibromyalgia: Secondary | ICD-10-CM

## 2021-09-27 DIAGNOSIS — M25512 Pain in left shoulder: Secondary | ICD-10-CM | POA: Diagnosis not present

## 2021-09-27 DIAGNOSIS — M542 Cervicalgia: Secondary | ICD-10-CM | POA: Diagnosis not present

## 2021-09-27 DIAGNOSIS — M6281 Muscle weakness (generalized): Secondary | ICD-10-CM | POA: Diagnosis not present

## 2021-09-27 NOTE — Telephone Encounter (Signed)
They do have the prescription. MyChart message sent to patient.

## 2021-09-27 NOTE — Telephone Encounter (Signed)
Not sure why were receiving a refill request for this.  The pharmacy should already have a prescription on file for October and November.  We may need to call them to verify that they can see these prescriptions.

## 2021-10-07 DIAGNOSIS — M6281 Muscle weakness (generalized): Secondary | ICD-10-CM | POA: Diagnosis not present

## 2021-10-07 DIAGNOSIS — M25512 Pain in left shoulder: Secondary | ICD-10-CM | POA: Diagnosis not present

## 2021-10-07 DIAGNOSIS — M542 Cervicalgia: Secondary | ICD-10-CM | POA: Diagnosis not present

## 2021-10-11 DIAGNOSIS — M6281 Muscle weakness (generalized): Secondary | ICD-10-CM | POA: Diagnosis not present

## 2021-10-11 DIAGNOSIS — M542 Cervicalgia: Secondary | ICD-10-CM | POA: Diagnosis not present

## 2021-10-11 DIAGNOSIS — M25512 Pain in left shoulder: Secondary | ICD-10-CM | POA: Diagnosis not present

## 2021-10-25 ENCOUNTER — Other Ambulatory Visit: Payer: Self-pay | Admitting: Family Medicine

## 2021-10-25 DIAGNOSIS — M797 Fibromyalgia: Secondary | ICD-10-CM

## 2021-10-25 DIAGNOSIS — M6281 Muscle weakness (generalized): Secondary | ICD-10-CM | POA: Diagnosis not present

## 2021-10-25 DIAGNOSIS — M542 Cervicalgia: Secondary | ICD-10-CM | POA: Diagnosis not present

## 2021-10-25 DIAGNOSIS — M25512 Pain in left shoulder: Secondary | ICD-10-CM | POA: Diagnosis not present

## 2021-11-02 DIAGNOSIS — M6281 Muscle weakness (generalized): Secondary | ICD-10-CM | POA: Diagnosis not present

## 2021-11-02 DIAGNOSIS — M25512 Pain in left shoulder: Secondary | ICD-10-CM | POA: Diagnosis not present

## 2021-11-02 DIAGNOSIS — M542 Cervicalgia: Secondary | ICD-10-CM | POA: Diagnosis not present

## 2021-11-09 ENCOUNTER — Other Ambulatory Visit: Payer: Self-pay | Admitting: Family Medicine

## 2021-11-11 ENCOUNTER — Encounter: Payer: Self-pay | Admitting: Family Medicine

## 2021-11-11 ENCOUNTER — Other Ambulatory Visit: Payer: Self-pay

## 2021-11-11 ENCOUNTER — Ambulatory Visit (INDEPENDENT_AMBULATORY_CARE_PROVIDER_SITE_OTHER): Payer: Medicare HMO | Admitting: Family Medicine

## 2021-11-11 VITALS — BP 132/68 | HR 83 | Ht 66.0 in | Wt 146.0 lb

## 2021-11-11 DIAGNOSIS — J069 Acute upper respiratory infection, unspecified: Secondary | ICD-10-CM | POA: Diagnosis not present

## 2021-11-11 DIAGNOSIS — M797 Fibromyalgia: Secondary | ICD-10-CM | POA: Diagnosis not present

## 2021-11-11 DIAGNOSIS — G8929 Other chronic pain: Secondary | ICD-10-CM

## 2021-11-11 DIAGNOSIS — M25512 Pain in left shoulder: Secondary | ICD-10-CM | POA: Diagnosis not present

## 2021-11-11 DIAGNOSIS — J4520 Mild intermittent asthma, uncomplicated: Secondary | ICD-10-CM | POA: Diagnosis not present

## 2021-11-11 DIAGNOSIS — E7800 Pure hypercholesterolemia, unspecified: Secondary | ICD-10-CM | POA: Diagnosis not present

## 2021-11-11 MED ORDER — TRAMADOL HCL 50 MG PO TABS: 50.0000 mg | ORAL_TABLET | Freq: Four times a day (QID) | ORAL | 0 refills | Status: DC | PRN

## 2021-11-11 MED ORDER — BENZONATATE 200 MG PO CAPS: 200.0000 mg | ORAL_CAPSULE | Freq: Two times a day (BID) | ORAL | 0 refills | Status: DC | PRN
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
## 2021-11-11 NOTE — Assessment & Plan Note (Addendum)
Indication for chronic opioid: Fibromyalgia  Medication and dose: Tramadol 50 mg QID # pills per month: 120 Last UDS date:9/8//2022 Opioid Treatment Agreement signed (Y/N):Y Opioid Treatment Agreement last reviewed with patient:12/8//2022 [facility] reviewed this encounter (include red flags): Y F/U : 3 months.  Next 3 rx sent.

## 2021-11-12 LAB — COMPLETE METABOLIC PANEL WITH GFR
AG Ratio: 1.6 (calc) (ref 1.0–2.5)
ALT: 35 U/L — ABNORMAL HIGH (ref 6–29)
AST: 26 U/L (ref 10–35)
Albumin: 4.2 g/dL (ref 3.6–5.1)
Alkaline phosphatase (APISO): 78 U/L (ref 37–153)
BUN/Creatinine Ratio: 4 (calc) — ABNORMAL LOW (ref 6–22)
BUN: 3 mg/dL — ABNORMAL LOW (ref 7–25)
CO2: 27 mmol/L (ref 20–32)
Calcium: 9.5 mg/dL (ref 8.6–10.4)
Chloride: 103 mmol/L (ref 98–110)
Creat: 0.72 mg/dL (ref 0.50–1.05)
Globulin: 2.7 g/dL (calc) (ref 1.9–3.7)
Glucose, Bld: 90 mg/dL (ref 65–99)
Potassium: 4.9 mmol/L (ref 3.5–5.3)
Sodium: 141 mmol/L (ref 135–146)
Total Bilirubin: 0.3 mg/dL (ref 0.2–1.2)
Total Protein: 6.9 g/dL (ref 6.1–8.1)
eGFR: 93 mL/min/{1.73_m2} (ref >=60)

## 2021-11-12 LAB — LIPID PANEL W/REFLEX DIRECT LDL
Cholesterol: 201 mg/dL — ABNORMAL HIGH (ref <200)
HDL: 56 mg/dL (ref >=50)
LDL Cholesterol (Calc): 123 mg/dL (calc) — ABNORMAL HIGH
Non-HDL Cholesterol (Calc): 145 mg/dL (calc) — ABNORMAL HIGH (ref <130)
Total CHOL/HDL Ratio: 3.6 (calc) (ref <5.0)
Triglycerides: 113 mg/dL (ref <150)

## 2021-11-12 LAB — CBC
HCT: 42.4 % (ref 35.0–45.0)
Hemoglobin: 14 g/dL (ref 11.7–15.5)
MCH: 29.4 pg (ref 27.0–33.0)
MCHC: 33 g/dL (ref 32.0–36.0)
MCV: 88.9 fL (ref 80.0–100.0)
MPV: 9.7 fL (ref 7.5–12.5)
Platelets: 335 10*3/uL (ref 140–400)
RBC: 4.77 10*6/uL (ref 3.80–5.10)
RDW: 12.4 % (ref 11.0–15.0)
WBC: 6.1 10*3/uL (ref 3.8–10.8)

## 2021-11-12 NOTE — Progress Notes (Signed)
Hi Breanna Casey,  One of your liver enzymes is elevated.  I would like to recheck in 1 months.  Your LDL looks better this time!! Great work!! Your blood count is normal.

## 2021-11-16 DIAGNOSIS — M25512 Pain in left shoulder: Secondary | ICD-10-CM | POA: Diagnosis not present

## 2021-11-16 DIAGNOSIS — M6281 Muscle weakness (generalized): Secondary | ICD-10-CM | POA: Diagnosis not present

## 2021-11-16 DIAGNOSIS — M542 Cervicalgia: Secondary | ICD-10-CM | POA: Diagnosis not present

## 2021-11-18 ENCOUNTER — Encounter: Payer: Self-pay | Admitting: Family Medicine

## 2021-11-29 DIAGNOSIS — H52223 Regular astigmatism, bilateral: Secondary | ICD-10-CM | POA: Diagnosis not present

## 2021-11-29 DIAGNOSIS — H524 Presbyopia: Secondary | ICD-10-CM | POA: Diagnosis not present

## 2021-11-30 DIAGNOSIS — M25512 Pain in left shoulder: Secondary | ICD-10-CM | POA: Diagnosis not present

## 2021-11-30 DIAGNOSIS — M542 Cervicalgia: Secondary | ICD-10-CM | POA: Diagnosis not present

## 2021-11-30 DIAGNOSIS — M6281 Muscle weakness (generalized): Secondary | ICD-10-CM | POA: Diagnosis not present

## 2021-12-02 DIAGNOSIS — M6281 Muscle weakness (generalized): Secondary | ICD-10-CM | POA: Diagnosis not present

## 2021-12-02 DIAGNOSIS — M25512 Pain in left shoulder: Secondary | ICD-10-CM | POA: Diagnosis not present

## 2021-12-02 DIAGNOSIS — M542 Cervicalgia: Secondary | ICD-10-CM | POA: Diagnosis not present

## 2021-12-16 DIAGNOSIS — M542 Cervicalgia: Secondary | ICD-10-CM | POA: Diagnosis not present

## 2021-12-16 DIAGNOSIS — M25512 Pain in left shoulder: Secondary | ICD-10-CM | POA: Diagnosis not present

## 2021-12-16 DIAGNOSIS — M6281 Muscle weakness (generalized): Secondary | ICD-10-CM | POA: Diagnosis not present

## 2021-12-20 ENCOUNTER — Other Ambulatory Visit: Payer: Self-pay

## 2021-12-20 DIAGNOSIS — R7989 Other specified abnormal findings of blood chemistry: Secondary | ICD-10-CM | POA: Diagnosis not present

## 2021-12-20 NOTE — Progress Notes (Signed)
Ordered labs per result note.  °

## 2021-12-21 LAB — COMPLETE METABOLIC PANEL WITH GFR
AG Ratio: 2 (calc) (ref 1.0–2.5)
ALT: 16 U/L (ref 6–29)
AST: 20 U/L (ref 10–35)
Albumin: 4.7 g/dL (ref 3.6–5.1)
Alkaline phosphatase (APISO): 73 U/L (ref 37–153)
BUN/Creatinine Ratio: 7 (calc) (ref 6–22)
BUN: 5 mg/dL — ABNORMAL LOW (ref 7–25)
CO2: 29 mmol/L (ref 20–32)
Calcium: 9.7 mg/dL (ref 8.6–10.4)
Chloride: 102 mmol/L (ref 98–110)
Creat: 0.72 mg/dL (ref 0.50–1.05)
Globulin: 2.3 g/dL (calc) (ref 1.9–3.7)
Glucose, Bld: 79 mg/dL (ref 65–99)
Potassium: 4.7 mmol/L (ref 3.5–5.3)
Sodium: 139 mmol/L (ref 135–146)
Total Bilirubin: 0.5 mg/dL (ref 0.2–1.2)
Total Protein: 7 g/dL (ref 6.1–8.1)
eGFR: 93 mL/min/{1.73_m2} (ref >=60)

## 2021-12-21 NOTE — Progress Notes (Signed)
Your lab work is within acceptable range and there are no concerning findings.   ?

## 2021-12-23 DIAGNOSIS — M542 Cervicalgia: Secondary | ICD-10-CM | POA: Diagnosis not present

## 2021-12-23 DIAGNOSIS — M25512 Pain in left shoulder: Secondary | ICD-10-CM | POA: Diagnosis not present

## 2021-12-23 DIAGNOSIS — M6281 Muscle weakness (generalized): Secondary | ICD-10-CM | POA: Diagnosis not present

## 2021-12-24 ENCOUNTER — Encounter: Payer: Self-pay | Admitting: Family Medicine

## 2021-12-24 DIAGNOSIS — Z1211 Encounter for screening for malignant neoplasm of colon: Secondary | ICD-10-CM

## 2021-12-27 NOTE — Telephone Encounter (Signed)
Could try [facility].  If she is okay with that okay to place new referral.

## 2021-12-30 DIAGNOSIS — M25512 Pain in left shoulder: Secondary | ICD-10-CM | POA: Diagnosis not present

## 2021-12-30 DIAGNOSIS — M6281 Muscle weakness (generalized): Secondary | ICD-10-CM | POA: Diagnosis not present

## 2021-12-30 DIAGNOSIS — M542 Cervicalgia: Secondary | ICD-10-CM | POA: Diagnosis not present

## 2021-12-30 NOTE — Telephone Encounter (Signed)
Referral sent 

## 2022-01-06 DIAGNOSIS — M6281 Muscle weakness (generalized): Secondary | ICD-10-CM | POA: Diagnosis not present

## 2022-01-06 DIAGNOSIS — M542 Cervicalgia: Secondary | ICD-10-CM | POA: Diagnosis not present

## 2022-01-06 DIAGNOSIS — M25512 Pain in left shoulder: Secondary | ICD-10-CM | POA: Diagnosis not present

## 2022-01-17 DIAGNOSIS — M6281 Muscle weakness (generalized): Secondary | ICD-10-CM | POA: Diagnosis not present

## 2022-01-17 DIAGNOSIS — M542 Cervicalgia: Secondary | ICD-10-CM | POA: Diagnosis not present

## 2022-01-17 DIAGNOSIS — M25512 Pain in left shoulder: Secondary | ICD-10-CM | POA: Diagnosis not present

## 2022-01-27 DIAGNOSIS — M25512 Pain in left shoulder: Secondary | ICD-10-CM | POA: Diagnosis not present

## 2022-01-27 DIAGNOSIS — M6281 Muscle weakness (generalized): Secondary | ICD-10-CM | POA: Diagnosis not present

## 2022-01-27 DIAGNOSIS — M542 Cervicalgia: Secondary | ICD-10-CM | POA: Diagnosis not present

## 2022-02-02 DIAGNOSIS — M6281 Muscle weakness (generalized): Secondary | ICD-10-CM | POA: Diagnosis not present

## 2022-02-02 DIAGNOSIS — M25512 Pain in left shoulder: Secondary | ICD-10-CM | POA: Diagnosis not present

## 2022-02-02 DIAGNOSIS — M542 Cervicalgia: Secondary | ICD-10-CM | POA: Diagnosis not present

## 2022-02-08 DIAGNOSIS — M25512 Pain in left shoulder: Secondary | ICD-10-CM | POA: Diagnosis not present

## 2022-02-08 DIAGNOSIS — M542 Cervicalgia: Secondary | ICD-10-CM | POA: Diagnosis not present

## 2022-02-08 DIAGNOSIS — M6281 Muscle weakness (generalized): Secondary | ICD-10-CM | POA: Diagnosis not present

## 2022-02-11 ENCOUNTER — Ambulatory Visit (INDEPENDENT_AMBULATORY_CARE_PROVIDER_SITE_OTHER): Payer: Medicare HMO | Admitting: Family Medicine

## 2022-02-11 ENCOUNTER — Other Ambulatory Visit: Payer: Self-pay

## 2022-02-11 ENCOUNTER — Encounter: Payer: Self-pay | Admitting: Family Medicine

## 2022-02-11 VITALS — BP 210/110 | HR 82 | Resp 16 | Ht 66.0 in | Wt 143.0 lb

## 2022-02-11 DIAGNOSIS — G8929 Other chronic pain: Secondary | ICD-10-CM | POA: Diagnosis not present

## 2022-02-11 DIAGNOSIS — M797 Fibromyalgia: Secondary | ICD-10-CM

## 2022-02-11 DIAGNOSIS — M25512 Pain in left shoulder: Secondary | ICD-10-CM | POA: Diagnosis not present

## 2022-02-11 MED ORDER — TRAMADOL HCL 50 MG PO TABS: 50.0000 mg | ORAL_TABLET | Freq: Four times a day (QID) | ORAL | 0 refills | Status: DC | PRN

## 2022-02-11 NOTE — Assessment & Plan Note (Signed)
Indication for chronic opioid:?Fibromyalgia? ?Medication and dose:?Tramadol?50 mg QID ?# pills per month: 120 ?Last UDS date:?02/11/21 ?Opioid Treatment Agreement signed (Y/N):?Y ?Opioid Treatment Agreement last reviewed with patient:??12/8//2022 ?[place] reviewed this encounter (include red flags): ?Y ?F/U : 3 months.? ?? ?Next 3 rx sent. ?
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
## 2022-02-11 NOTE — Assessment & Plan Note (Signed)
Continue to work on regular stretching.  Medications refilled.  Again this is usually her good time of year so hopefully she will just continue to do well through the spring.  Sleep quality is good ?

## 2022-02-12 LAB — DM TEMPLATE

## 2022-02-12 LAB — DRUG MONITORING, PANEL 6 WITH CONFIRMATION, URINE
6 Acetylmorphine: NEGATIVE ng/mL (ref <10)
Alcohol Metabolites: NEGATIVE ng/mL (ref <500)
Amphetamines: NEGATIVE ng/mL (ref <500)
Barbiturates: NEGATIVE ng/mL (ref <300)
Benzodiazepines: NEGATIVE ng/mL (ref <100)
Cocaine Metabolite: NEGATIVE ng/mL (ref <150)
Creatinine: 9.6 mg/dL — ABNORMAL LOW (ref >=20.0)
Marijuana Metabolite: NEGATIVE ng/mL (ref <20)
Methadone Metabolite: NEGATIVE ng/mL (ref <100)
Opiates: NEGATIVE ng/mL (ref <100)
Oxidant: NEGATIVE ug/mL (ref <200)
Oxycodone: NEGATIVE ng/mL (ref <100)
Phencyclidine: NEGATIVE ng/mL (ref <25)
Specific Gravity: 1.002 — ABNORMAL LOW (ref >=1.003)
pH: 5.6 (ref 4.5–9.0)

## 2022-02-15 ENCOUNTER — Encounter: Payer: Self-pay | Admitting: Family Medicine

## 2022-02-16 ENCOUNTER — Ambulatory Visit (INDEPENDENT_AMBULATORY_CARE_PROVIDER_SITE_OTHER): Payer: Medicare HMO

## 2022-02-16 ENCOUNTER — Encounter: Payer: Self-pay | Admitting: Sports Medicine

## 2022-02-16 ENCOUNTER — Other Ambulatory Visit: Payer: Self-pay

## 2022-02-16 ENCOUNTER — Ambulatory Visit (INDEPENDENT_AMBULATORY_CARE_PROVIDER_SITE_OTHER): Payer: Medicare HMO | Admitting: Sports Medicine

## 2022-02-16 DIAGNOSIS — M7542 Impingement syndrome of left shoulder: Secondary | ICD-10-CM

## 2022-02-16 DIAGNOSIS — M75102 Unspecified rotator cuff tear or rupture of left shoulder, not specified as traumatic: Secondary | ICD-10-CM | POA: Insufficient documentation

## 2022-02-16 NOTE — Assessment & Plan Note (Signed)
Pleasant 66 year old female, chronic left shoulder pain, localized anteriorly and posterior, worse with abduction. 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 ?

## 2022-02-16 NOTE — Progress Notes (Signed)
? ? ?  Procedures performed today:   ? ?Procedure: Real-time Ultrasound Guided injection of the left subacromial bursa ?Device: Samsung [id number]  ?Verbal informed consent obtained.  ?Time-out conducted.  ?Noted no overlying erythema, induration, or other signs of local infection.  ?Skin prepped in a sterile fashion.  ?Local anesthesia: Topical Ethyl chloride.  ?With sterile technique and under real time ultrasound guidance: Noted intact rotator cuff, 1 cc lidocaine, 1 cc bupivacaine, 1 cc kenalog 40 injected easily. ?Completed without difficulty  ?Advised to call if fevers/chills, erythema, induration, drainage, or persistent bleeding.  ?Images permanently stored and available for review in PACS.  ?Impression: Technically successful ultrasound guided injection. ? ?Independent interpretation of notes and tests performed by another provider:  ? ?None. ? ?Brief History, Exam, Impression, and Recommendations:   ? ?Impingement syndrome, shoulder, left ?Breanna 66 year old Casey, chronic left shoulder pain, localized anteriorly and posterior, worse with abduction. 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 ? ? ? ?___________________________________________ ?Gwen Her. Dianah Field, M.D., ABFM., CAQSM. ?Primary Care and Sports Medicine ?[place] ? ?Adjunct Instructor of Family Medicine  ?[employer] ?

## 2022-03-07 DIAGNOSIS — D122 Benign neoplasm of ascending colon: Secondary | ICD-10-CM | POA: Diagnosis not present

## 2022-03-07 DIAGNOSIS — Z86010 Personal history of colonic polyps: Secondary | ICD-10-CM | POA: Diagnosis not present

## 2022-03-07 DIAGNOSIS — Z09 Encounter for follow-up examination after completed treatment for conditions other than malignant neoplasm: Secondary | ICD-10-CM | POA: Diagnosis not present

## 2022-03-07 DIAGNOSIS — D120 Benign neoplasm of cecum: Secondary | ICD-10-CM | POA: Diagnosis not present

## 2022-03-07 LAB — HM COLONOSCOPY

## 2022-03-14 ENCOUNTER — Encounter: Payer: Self-pay | Admitting: Family Medicine

## 2022-03-30 ENCOUNTER — Ambulatory Visit (INDEPENDENT_AMBULATORY_CARE_PROVIDER_SITE_OTHER): Payer: Medicare HMO | Admitting: Sports Medicine

## 2022-03-30 DIAGNOSIS — M7542 Impingement syndrome of left shoulder: Secondary | ICD-10-CM

## 2022-03-30 NOTE — Assessment & Plan Note (Signed)
This is a very pleasant 66 year old female, she had impingement syndrome, we injected her subacromial bursa at the last visit and she returns today mostly pain-free, she still has some pain in the left trapezius, we will print her out some rotator cuff conditioning, cervical spondylosis conditioning, and she can do this indefinitely, she only needs to see me back as needed. ?

## 2022-03-30 NOTE — Progress Notes (Signed)
? ? ?  Procedures performed today:   ? ?None. ? ?Independent interpretation of notes and tests performed by another provider:  ? ?None. ? ?Brief History, Exam, Impression, and Recommendations:   ? ?Impingement syndrome, shoulder, left ?This is a very pleasant 66 year old female, she had impingement syndrome, we injected Casey subacromial bursa at the last visit and she returns today mostly pain-free, she still has some pain in the left trapezius, we will print Casey out some rotator cuff conditioning, cervical spondylosis conditioning, and she can do this indefinitely, she only needs to see me back as needed. ? ? ? ?___________________________________________ ?Breanna Casey. Dianah Field, M.D., ABFM., CAQSM. ?Primary Care and Sports Medicine ?[place] ? ?Adjunct Instructor of Family Medicine  ?[employer] ?

## 2022-04-25 ENCOUNTER — Other Ambulatory Visit: Payer: Self-pay | Admitting: Family Medicine

## 2022-04-26 ENCOUNTER — Other Ambulatory Visit: Payer: Self-pay | Admitting: Family Medicine

## 2022-04-26 DIAGNOSIS — M797 Fibromyalgia: Secondary | ICD-10-CM

## 2022-04-26 NOTE — Telephone Encounter (Signed)
Call patient: She should already have her pain medication on file at the pharmacy to be filled on 525 so she needs to call them for the refill instead of calling us.

## 2022-04-27 NOTE — Telephone Encounter (Signed)
LVM for pt to call to discuss.  T. Kamil Hanigan, CMA  

## 2022-04-27 NOTE — Telephone Encounter (Signed)
Patient advised of message and verbalized understanding.  

## 2022-04-28 ENCOUNTER — Other Ambulatory Visit: Payer: Self-pay | Admitting: Family Medicine

## 2022-04-28 DIAGNOSIS — M797 Fibromyalgia: Secondary | ICD-10-CM

## 2022-05-17 ENCOUNTER — Encounter: Payer: Self-pay | Admitting: Family Medicine

## 2022-05-17 ENCOUNTER — Ambulatory Visit (INDEPENDENT_AMBULATORY_CARE_PROVIDER_SITE_OTHER): Payer: Medicare HMO | Admitting: Family Medicine

## 2022-05-17 VITALS — BP 132/70 | HR 72 | Resp 18 | Ht 66.0 in | Wt 144.0 lb

## 2022-05-17 DIAGNOSIS — M545 Low back pain, unspecified: Secondary | ICD-10-CM

## 2022-05-17 DIAGNOSIS — M797 Fibromyalgia: Secondary | ICD-10-CM

## 2022-05-17 DIAGNOSIS — R002 Palpitations: Secondary | ICD-10-CM | POA: Diagnosis not present

## 2022-05-17 DIAGNOSIS — M549 Dorsalgia, unspecified: Secondary | ICD-10-CM | POA: Diagnosis not present

## 2022-05-17 MED ORDER — TRAMADOL HCL 50 MG PO TABS: 50.0000 mg | ORAL_TABLET | Freq: Four times a day (QID) | ORAL | 0 refills | Status: DC | PRN

## 2022-05-17 NOTE — Progress Notes (Signed)
Established Patient Office Visit  Subjective   Patient ID: Breanna Casey, female    DOB: 08/09/1956  Age: 66 y.o. MRN: [id number]  Chief Complaint  Patient presents with   Follow-up    3 month.    Back Pain    Low back pain for 1 month and upper back pain for 1 week.    Fast Heartrate     Off and on for 2 months      HPI  We month follow-up for fibromyalgia-overall stable.  Due for refills today on medications she does have a couple of other issues that she would like to address today as well.  Experiencing low back pain for about the lowest 2 weeks and upper back pain for about the last week.  Noticed it started after doing some gardening but she did not do a lot of bending or twisting and really was not doing any heavy lifting she does not feel like it was significant enough to cause discomfort.  The low back pain is over the mid back but slightly to the left.  And upper back pain is between the shoulder blade and the spine on the left side.  Denies any radiation of pain.  Also reports for the last couple of months has been experiencing an increase/fast heart rate particularly at night after lying down.  Episodes typically last between 10 to 15 minutes.  It was happening nightly for quite some time.  Has had a couple of days where it occurred during the daytime.  Denies any changes to diet or regimen.  No new supplements.  No caffeine intake.  No decongestants she does take plain Allegra.  Nothing specific to cause it.  She does have a history of palpitations but has not been bothered by them for quite some time.  Had an echocardiogram in 2018 and an exercise stress test in 2016.    ROS    Objective:     BP 132/70   Pulse 72   Resp 18   Ht '5\' 6"'$  (1.676 m)   Wt 144 lb (65.3 kg)   SpO2 99%   BMI 23.24 kg/m    Physical Exam Vitals and nursing note reviewed.  Constitutional:      Appearance: She is well-developed.  HENT:     Head: Normocephalic and atraumatic.   Cardiovascular:     Rate and Rhythm: Normal rate and regular rhythm.     Heart sounds: Normal heart sounds.  Pulmonary:     Effort: Pulmonary effort is normal.     Breath sounds: Normal breath sounds.  Musculoskeletal:     Comments: No significant tenderness over the thoracic or lumbar spine.  Nontender over the left SI joint or paraspinous muscles.  Mild tenderness over the left upper back.  Normal lumbar flexion, extension rotation and side bending.  Skin:    General: Skin is warm and dry.  Neurological:     Mental Status: She is alert and oriented to person, place, and time.  Psychiatric:        Behavior: Behavior normal.      No results found for any visits on 05/17/22.    The 10-year ASCVD risk score (Arnett DK, et al., 2019) is: 5.9%    Assessment & Plan:   Problem List Items Addressed This Visit       Other   Fibromyalgia    Indication for chronic opioid: Fibromyalgia  Medication and dose: Tramadol '50mg'$   # pills per month:  120 Last UDS date: 02/11/22 Opioid Treatment Agreement signed (Y/N): Y Opioid Treatment Agreement last reviewed with patient: 04/23/2020  5:11 PM NCCSRS reviewed this encounter (include red flags): Yes       Relevant Medications   traMADol (ULTRAM) 50 MG tablet (Start on 07/26/2022)   traMADol (ULTRAM) 50 MG tablet (Start on 06/26/2022)   traMADol (ULTRAM) 50 MG tablet (Start on 05/28/2022)   Other Visit Diagnoses     Palpitations    -  Primary   Relevant Orders   COMPLETE METABOLIC PANEL WITH GFR   TSH   CBC   Magnesium   Upper back pain       Relevant Medications   traMADol (ULTRAM) 50 MG tablet (Start on 07/26/2022)   traMADol (ULTRAM) 50 MG tablet (Start on 06/26/2022)   traMADol (ULTRAM) 50 MG tablet (Start on 05/28/2022)   Acute midline low back pain without sciatica       Relevant Medications   traMADol (ULTRAM) 50 MG tablet (Start on 07/26/2022)   traMADol (ULTRAM) 50 MG tablet (Start on 06/26/2022)   traMADol (ULTRAM) 50 MG  tablet (Start on 05/28/2022)      Palpitations-EKG today shows rate of 63 bpm, normal sinus rhythm with no acute ST-T wave changes.  We discussed getting updated labs to just to rule out anemia, electrolyte disturbance, or thyroid disorder.  If everything looks normal she feels comfortable just watching it for a couple of weeks to see if it improves on its own but if it does not then I do think she will need further work-up with either a cardiac monitor or cardiology consultation.  Upper back and low back pain-I think this is more muscle related.  Given some stretches to do on her home and it home over the next 2 to 3 weeks.  If not improving then please let us know and we can consider formal PT.  Katie use heat and or ice.  She says eyes usually feels better.    Return in about 3 months (around 08/17/2022) for chronic pain management.    Beatrice Lecher, MD

## 2022-05-17 NOTE — Assessment & Plan Note (Signed)
Indication for chronic opioid: Fibromyalgia  Medication and dose: Tramadol '50mg'$   # pills per month: 120 Last UDS date: 02/11/22 Opioid Treatment Agreement signed (Y/N): Y Opioid Treatment Agreement last reviewed with patient: 04/23/2020  5:11 PM [place] reviewed this encounter (include red flags): Yes

## 2022-05-19 NOTE — Addendum Note (Signed)
Addended by: Narda Rutherford on: 05/19/2022 08:10 AM   Modules accepted: Orders

## 2022-05-23 DIAGNOSIS — R002 Palpitations: Secondary | ICD-10-CM | POA: Diagnosis not present

## 2022-05-23 DIAGNOSIS — M6281 Muscle weakness (generalized): Secondary | ICD-10-CM | POA: Diagnosis not present

## 2022-05-24 LAB — COMPLETE METABOLIC PANEL WITH GFR
AG Ratio: 1.9 (calc) (ref 1.0–2.5)
ALT: 17 U/L (ref 6–29)
AST: 18 U/L (ref 10–35)
Albumin: 4.5 g/dL (ref 3.6–5.1)
Alkaline phosphatase (APISO): 65 U/L (ref 37–153)
BUN/Creatinine Ratio: 7 (calc) (ref 6–22)
BUN: 5 mg/dL — ABNORMAL LOW (ref 7–25)
CO2: 29 mmol/L (ref 20–32)
Calcium: 9.9 mg/dL (ref 8.6–10.4)
Chloride: 107 mmol/L (ref 98–110)
Creat: 0.73 mg/dL (ref 0.50–1.05)
Globulin: 2.4 g/dL (calc) (ref 1.9–3.7)
Glucose, Bld: 72 mg/dL (ref 65–139)
Potassium: 4.6 mmol/L (ref 3.5–5.3)
Sodium: 143 mmol/L (ref 135–146)
Total Bilirubin: 0.5 mg/dL (ref 0.2–1.2)
Total Protein: 6.9 g/dL (ref 6.1–8.1)
eGFR: 91 mL/min/{1.73_m2} (ref >=60)

## 2022-05-24 LAB — CBC
HCT: 45.3 % — ABNORMAL HIGH (ref 35.0–45.0)
Hemoglobin: 15.3 g/dL (ref 11.7–15.5)
MCH: 29.3 pg (ref 27.0–33.0)
MCHC: 33.8 g/dL (ref 32.0–36.0)
MCV: 86.8 fL (ref 80.0–100.0)
MPV: 9.8 fL (ref 7.5–12.5)
Platelets: 271 10*3/uL (ref 140–400)
RBC: 5.22 10*6/uL — ABNORMAL HIGH (ref 3.80–5.10)
RDW: 12.2 % (ref 11.0–15.0)
WBC: 5.5 10*3/uL (ref 3.8–10.8)

## 2022-05-24 LAB — TSH: TSH: 1.61 mIU/L (ref 0.40–4.50)

## 2022-05-24 LAB — MAGNESIUM: Magnesium: 2.4 mg/dL (ref 1.5–2.5)

## 2022-05-24 NOTE — Progress Notes (Signed)
Breanna Casey,   Thyroid looks great.  Magnesium is normal.  RBC a little elevated.  Kidney and liver look good.

## 2022-05-30 ENCOUNTER — Encounter: Payer: Self-pay | Admitting: Family Medicine

## 2022-05-30 MED ORDER — FLUCONAZOLE 150 MG PO TABS: 150.0000 mg | ORAL_TABLET | Freq: Once | ORAL | 1 refills | Status: AC

## 2022-05-30 MED ORDER — PREDNISONE 20 MG PO TABS
ORAL_TABLET | ORAL | 0 refills | Status: DC
Start: 2022-05-30 — End: 2023-02-02

## 2022-05-30 NOTE — Telephone Encounter (Signed)
Meds ordered this encounter  Medications   predniSONE (DELTASONE) 20 MG tablet    Sig: Take 1 tablet twice a day for 5 days. For allergic reactions.    Dispense:  20 tablet    Refill:  0

## 2022-07-04 DIAGNOSIS — H524 Presbyopia: Secondary | ICD-10-CM | POA: Diagnosis not present

## 2022-07-15 ENCOUNTER — Ambulatory Visit (INDEPENDENT_AMBULATORY_CARE_PROVIDER_SITE_OTHER): Payer: Medicare HMO

## 2022-07-15 ENCOUNTER — Encounter: Payer: Self-pay | Admitting: Sports Medicine

## 2022-07-15 ENCOUNTER — Ambulatory Visit (INDEPENDENT_AMBULATORY_CARE_PROVIDER_SITE_OTHER): Payer: Medicare HMO | Admitting: Sports Medicine

## 2022-07-15 DIAGNOSIS — S20229A Contusion of unspecified back wall of thorax, initial encounter: Secondary | ICD-10-CM

## 2022-07-15 DIAGNOSIS — G8929 Other chronic pain: Secondary | ICD-10-CM | POA: Insufficient documentation

## 2022-07-15 DIAGNOSIS — M7542 Impingement syndrome of left shoulder: Secondary | ICD-10-CM | POA: Diagnosis not present

## 2022-07-15 DIAGNOSIS — M47814 Spondylosis without myelopathy or radiculopathy, thoracic region: Secondary | ICD-10-CM | POA: Diagnosis not present

## 2022-07-15 DIAGNOSIS — M545 Low back pain, unspecified: Secondary | ICD-10-CM | POA: Diagnosis not present

## 2022-07-15 NOTE — Assessment & Plan Note (Signed)
Breanna Casey also has a recurrence of left shoulder impingement syndrome, we did an injection in March and she did really well, now with a recurrence of pain we will have her do the rotator cuff conditioning exercises again and persistent pain over the next 2 to 4 weeks we will do another subacromial injection.
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
## 2022-07-18 ENCOUNTER — Encounter: Payer: Self-pay | Admitting: Sports Medicine

## 2022-07-27 ENCOUNTER — Encounter: Payer: Self-pay | Admitting: General Practice

## 2022-07-29 ENCOUNTER — Ambulatory Visit (INDEPENDENT_AMBULATORY_CARE_PROVIDER_SITE_OTHER): Payer: Medicare HMO

## 2022-07-29 ENCOUNTER — Ambulatory Visit (INDEPENDENT_AMBULATORY_CARE_PROVIDER_SITE_OTHER): Payer: Medicare HMO | Admitting: Sports Medicine

## 2022-07-29 DIAGNOSIS — M7542 Impingement syndrome of left shoulder: Secondary | ICD-10-CM

## 2022-07-29 NOTE — Assessment & Plan Note (Signed)
Recurrence of left shoulder impingement syndrome, she had a subacromial injection back in March with recurrence of pain, we had her do some additional cuff conditioning exercises, unfortunately continuing to have discomfort so we repeated a subacromial injection today. If she does not get sufficient relief we can target the glenohumeral joint next.

## 2022-07-29 NOTE — Progress Notes (Signed)
    Procedures performed today:    Procedure: Real-time Ultrasound Guided injection of the left subacromial bursa Device: Samsung HS60  Verbal informed consent obtained.  Time-out conducted.  Noted no overlying erythema, induration, or other signs of local infection.  Skin prepped in a sterile fashion.  Local anesthesia: Topical Ethyl chloride.  With sterile technique and under real time ultrasound guidance: Intact cuff noted, 1 cc Kenalog 40, 1 cc lidocaine, 1 cc bupivacaine injected easily Completed without difficulty  Advised to call if fevers/chills, erythema, induration, drainage, or persistent bleeding.  Images permanently stored and available for review in PACS.  Impression: Technically successful ultrasound guided injection.  Independent interpretation of notes and tests performed by another provider:   None.  Brief History, Exam, Impression, and Recommendations:    Impingement syndrome, shoulder, left Recurrence of left shoulder impingement syndrome, she had a subacromial injection back in March with recurrence of pain, we had her do some additional cuff conditioning exercises, unfortunately continuing to have discomfort so we repeated a subacromial injection today. If she does not get sufficient relief we can target the glenohumeral joint next.    ____________________________________________ Gwen Her. Dianah Field, M.D., ABFM., CAQSM., AME. Primary Care and Sports Medicine [facility]  Adjunct Professor of [place] [employer] [facility] [employer] Medicine  Risk manager

## 2022-08-03 DIAGNOSIS — D2371 Other benign neoplasm of skin of right lower limb, including hip: Secondary | ICD-10-CM | POA: Diagnosis not present

## 2022-08-03 DIAGNOSIS — D2361 Other benign neoplasm of skin of right upper limb, including shoulder: Secondary | ICD-10-CM | POA: Diagnosis not present

## 2022-08-03 DIAGNOSIS — L820 Inflamed seborrheic keratosis: Secondary | ICD-10-CM | POA: Diagnosis not present

## 2022-08-03 DIAGNOSIS — L821 Other seborrheic keratosis: Secondary | ICD-10-CM | POA: Diagnosis not present

## 2022-08-03 DIAGNOSIS — Z85820 Personal history of malignant melanoma of skin: Secondary | ICD-10-CM | POA: Diagnosis not present

## 2022-08-03 DIAGNOSIS — D485 Neoplasm of uncertain behavior of skin: Secondary | ICD-10-CM | POA: Diagnosis not present

## 2022-08-12 ENCOUNTER — Ambulatory Visit: Payer: Medicare HMO | Admitting: Sports Medicine

## 2022-08-15 ENCOUNTER — Ambulatory Visit: Payer: Medicare HMO | Admitting: Sports Medicine

## 2022-08-23 ENCOUNTER — Encounter: Payer: Self-pay | Admitting: Family Medicine

## 2022-08-23 ENCOUNTER — Ambulatory Visit (INDEPENDENT_AMBULATORY_CARE_PROVIDER_SITE_OTHER): Payer: Medicare HMO | Admitting: Family Medicine

## 2022-08-23 VITALS — BP 128/76 | HR 79 | Wt 143.0 lb

## 2022-08-23 DIAGNOSIS — M549 Dorsalgia, unspecified: Secondary | ICD-10-CM | POA: Diagnosis not present

## 2022-08-23 DIAGNOSIS — Z780 Asymptomatic menopausal state: Secondary | ICD-10-CM | POA: Diagnosis not present

## 2022-08-23 DIAGNOSIS — J4540 Moderate persistent asthma, uncomplicated: Secondary | ICD-10-CM | POA: Diagnosis not present

## 2022-08-23 DIAGNOSIS — G8929 Other chronic pain: Secondary | ICD-10-CM | POA: Diagnosis not present

## 2022-08-23 DIAGNOSIS — M797 Fibromyalgia: Secondary | ICD-10-CM

## 2022-08-23 DIAGNOSIS — M419 Scoliosis, unspecified: Secondary | ICD-10-CM | POA: Diagnosis not present

## 2022-08-23 DIAGNOSIS — M545 Low back pain, unspecified: Secondary | ICD-10-CM

## 2022-08-23 DIAGNOSIS — M816 Localized osteoporosis [Lequesne]: Secondary | ICD-10-CM

## 2022-08-23 MED ORDER — TRAMADOL HCL 50 MG PO TABS: 50.0000 mg | ORAL_TABLET | Freq: Four times a day (QID) | ORAL | 0 refills | Status: DC | PRN

## 2022-08-23 NOTE — Assessment & Plan Note (Signed)
Indication for chronic opioid:Fibromyalgia Medication and dose:Tramadol50 mg QID # pills per month: 120 Last UDS date:08/23/2022 Opioid Treatment Agreement signed (Y/N):Y Opioid Treatment Agreement last reviewed with patient:12/8//2022 NCCSRS reviewed this encounter (include red flags): Y F/U : 3 months.  Next 3 rx sent

## 2022-08-23 NOTE — Assessment & Plan Note (Signed)
Over the last months she has improved and doing well on the Flovent without having to use her rescue inhaler so we will keep an eye on that as we move into the fall.
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
## 2022-08-23 NOTE — Assessment & Plan Note (Signed)
Has had an increase in pain in her upper body so we will go ahead and place physical therapy referral.  She would like to be seen at [facility].

## 2022-08-23 NOTE — Assessment & Plan Note (Signed)
Due to repeat DEXA next month.  We will go ahead and place order so that they can get her scheduled.

## 2022-08-24 LAB — DRUG MONITORING, PANEL 6 WITH CONFIRMATION, URINE
6 Acetylmorphine: NEGATIVE ng/mL (ref <10)
Alcohol Metabolites: NEGATIVE ng/mL (ref <500)
Amphetamines: NEGATIVE ng/mL (ref <500)
Barbiturates: NEGATIVE ng/mL (ref <300)
Benzodiazepines: NEGATIVE ng/mL (ref <100)
Cocaine Metabolite: NEGATIVE ng/mL (ref <150)
Creatinine: 7.2 mg/dL — ABNORMAL LOW (ref >=20.0)
Marijuana Metabolite: NEGATIVE ng/mL (ref <20)
Methadone Metabolite: NEGATIVE ng/mL (ref <100)
Opiates: NEGATIVE ng/mL (ref <100)
Oxidant: NEGATIVE ug/mL (ref <200)
Oxycodone: NEGATIVE ng/mL (ref <100)
Phencyclidine: NEGATIVE ng/mL (ref <25)
Specific Gravity: 1.002 — ABNORMAL LOW (ref >=1.003)
pH: 6.8 (ref 4.5–9.0)

## 2022-08-24 LAB — DM TEMPLATE

## 2022-09-05 DIAGNOSIS — M542 Cervicalgia: Secondary | ICD-10-CM | POA: Diagnosis not present

## 2022-09-05 DIAGNOSIS — M545 Low back pain, unspecified: Secondary | ICD-10-CM | POA: Diagnosis not present

## 2022-09-05 DIAGNOSIS — M6281 Muscle weakness (generalized): Secondary | ICD-10-CM | POA: Diagnosis not present

## 2022-09-09 ENCOUNTER — Encounter: Payer: Self-pay | Admitting: Family Medicine

## 2022-09-21 DIAGNOSIS — M545 Low back pain, unspecified: Secondary | ICD-10-CM | POA: Diagnosis not present

## 2022-09-21 DIAGNOSIS — M542 Cervicalgia: Secondary | ICD-10-CM | POA: Diagnosis not present

## 2022-09-21 DIAGNOSIS — M6281 Muscle weakness (generalized): Secondary | ICD-10-CM | POA: Diagnosis not present

## 2022-09-23 ENCOUNTER — Encounter: Payer: Self-pay | Admitting: Family Medicine

## 2022-09-26 DIAGNOSIS — M545 Low back pain, unspecified: Secondary | ICD-10-CM | POA: Diagnosis not present

## 2022-09-26 DIAGNOSIS — M542 Cervicalgia: Secondary | ICD-10-CM | POA: Diagnosis not present

## 2022-09-26 DIAGNOSIS — M6281 Muscle weakness (generalized): Secondary | ICD-10-CM | POA: Diagnosis not present

## 2022-09-29 ENCOUNTER — Encounter: Payer: Self-pay | Admitting: Family Medicine

## 2022-09-30 NOTE — Telephone Encounter (Signed)
Answered the other one earlier this morning.

## 2022-10-03 DIAGNOSIS — M545 Low back pain, unspecified: Secondary | ICD-10-CM | POA: Diagnosis not present

## 2022-10-03 DIAGNOSIS — M6281 Muscle weakness (generalized): Secondary | ICD-10-CM | POA: Diagnosis not present

## 2022-10-03 DIAGNOSIS — M542 Cervicalgia: Secondary | ICD-10-CM | POA: Diagnosis not present

## 2022-10-05 ENCOUNTER — Ambulatory Visit (INDEPENDENT_AMBULATORY_CARE_PROVIDER_SITE_OTHER): Payer: Medicare HMO

## 2022-10-05 DIAGNOSIS — Z780 Asymptomatic menopausal state: Secondary | ICD-10-CM

## 2022-10-05 DIAGNOSIS — M810 Age-related osteoporosis without current pathological fracture: Secondary | ICD-10-CM

## 2022-10-05 NOTE — Progress Notes (Signed)
Bone density shows a T score of -3.1.  this is consistent with osteoporosis.    The current recommendation for osteoporosis treatment includes:   #1 calcium-total of 1200 mg of calcium daily.  If you eat a very calcium rich diet you may be able to obtain that without a supplement.  If not, then I recommend calcium 500 mg twice a day.  There are several products over-the-counter such as Caltrate D and Viactiv chews which are great options that contain calcium and vitamin D. #2 vitamin D-recommend 800 international units daily. #3 exercise-recommend 30 minutes of weightbearing exercise 3 days a week.  Resistance training ,such as doing bands and light weights, can be particularly helpful. #4 medication-if you are not currently on a bone builder, also called a bisphosphonate, then this has been shown to be very helpful in maintaining bone strength, preventing further thinning of the bones, and reducing your risk for fractures.  I would highly recommend that you consider starting 1 of these medications.  If you are okay with that then please let us know and we will send one to your pharmacy.  If you would like to discuss further we are happy to make an appointment for you so that we can go over options for treatment.

## 2022-10-15 ENCOUNTER — Other Ambulatory Visit: Payer: Self-pay | Admitting: Family Medicine

## 2022-10-17 DIAGNOSIS — M542 Cervicalgia: Secondary | ICD-10-CM | POA: Diagnosis not present

## 2022-10-17 DIAGNOSIS — M6281 Muscle weakness (generalized): Secondary | ICD-10-CM | POA: Diagnosis not present

## 2022-10-17 DIAGNOSIS — M545 Low back pain, unspecified: Secondary | ICD-10-CM | POA: Diagnosis not present

## 2022-10-26 NOTE — Telephone Encounter (Signed)
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  Thank you so much.

## 2022-10-31 ENCOUNTER — Telehealth: Payer: Self-pay

## 2022-10-31 ENCOUNTER — Encounter: Payer: Self-pay | Admitting: Family Medicine

## 2022-10-31 DIAGNOSIS — M6281 Muscle weakness (generalized): Secondary | ICD-10-CM | POA: Diagnosis not present

## 2022-10-31 DIAGNOSIS — M542 Cervicalgia: Secondary | ICD-10-CM | POA: Diagnosis not present

## 2022-10-31 DIAGNOSIS — M545 Low back pain, unspecified: Secondary | ICD-10-CM | POA: Diagnosis not present

## 2022-10-31 NOTE — Telephone Encounter (Addendum)
Initiated Prior authorization [id number] HFA 110MCG/ACT aerosol Via: Covermymeds Case/Key:[id number] Status: approved  as of 10/31/22 Reason:Coverage Starts on: 10/31/2022 11:42:35 AM, Coverage Ends on: 10/31/2022 Notified Pt via: Mychart

## 2022-11-01 ENCOUNTER — Telehealth: Payer: Self-pay

## 2022-11-01 NOTE — Telephone Encounter (Addendum)
Formulary Tier exception review authorization [id number] HFA 110MCG/ACT  Via: [employer] Case/Key:n/a Status: denied as of 11/01/22 Reason: this request was faxed to [phone] This medication was denied  due to lack of medical necessity, plan states pt does not have any know intolerance to arnuity ellipta, pt must try  and fail all formulary alternatives. Notified Pt via: [place] to confirm details  Flovent is $295 retail [employer]'s co-pay for this medication is $45 Pt last picked up this medication 10/19/22 Pt has 3 refills on file with an active pa till 12/04/22   [employer] will allow for a resubmission of tier exception request after January 9th 2024, I was informed this medication is plan exclusion entering the new year, although submitting a pa will automatically approve the medication the cost will be 244% on the policy holder,In addition to this information if a formulary tier review were to be approved the  new co-pay price of this medication entering 2024 will be  $95.

## 2022-11-07 DIAGNOSIS — M545 Low back pain, unspecified: Secondary | ICD-10-CM | POA: Diagnosis not present

## 2022-11-07 DIAGNOSIS — M6281 Muscle weakness (generalized): Secondary | ICD-10-CM | POA: Diagnosis not present

## 2022-11-07 DIAGNOSIS — M542 Cervicalgia: Secondary | ICD-10-CM | POA: Diagnosis not present

## 2022-11-08 ENCOUNTER — Encounter: Payer: Self-pay | Admitting: Family Medicine

## 2022-11-15 DIAGNOSIS — M6281 Muscle weakness (generalized): Secondary | ICD-10-CM | POA: Diagnosis not present

## 2022-11-15 DIAGNOSIS — M545 Low back pain, unspecified: Secondary | ICD-10-CM | POA: Diagnosis not present

## 2022-11-15 DIAGNOSIS — M542 Cervicalgia: Secondary | ICD-10-CM | POA: Diagnosis not present

## 2022-11-22 ENCOUNTER — Ambulatory Visit (INDEPENDENT_AMBULATORY_CARE_PROVIDER_SITE_OTHER): Payer: Medicare HMO | Admitting: Family Medicine

## 2022-11-22 DIAGNOSIS — M542 Cervicalgia: Secondary | ICD-10-CM | POA: Diagnosis not present

## 2022-11-22 DIAGNOSIS — G8929 Other chronic pain: Secondary | ICD-10-CM | POA: Diagnosis not present

## 2022-11-22 DIAGNOSIS — M797 Fibromyalgia: Secondary | ICD-10-CM

## 2022-11-22 DIAGNOSIS — J4540 Moderate persistent asthma, uncomplicated: Secondary | ICD-10-CM | POA: Diagnosis not present

## 2022-11-22 MED ORDER — TRAMADOL HCL 50 MG PO TABS: 50.0000 mg | ORAL_TABLET | Freq: Four times a day (QID) | ORAL | 0 refills | Status: DC | PRN

## 2022-11-22 NOTE — Assessment & Plan Note (Signed)
Encouraged her to check with her pharmacist to see what she might be able to take that does not have a milk allergen.  Or we can even refer to asthma and allergy.  She will try to run the new prescription into next year and see what happens but it would likely not be covered unfortunately.

## 2022-11-22 NOTE — Assessment & Plan Note (Signed)
Stable on current regimen   

## 2022-11-22 NOTE — Assessment & Plan Note (Signed)
Well with formal physical therapy.  When she completes her sessions I am happy to write a new order to particularly address her osteoporosis as well.

## 2022-11-22 NOTE — Assessment & Plan Note (Signed)
Indication for chronic opioid: Fibromyalgia  Medication and dose: Tramadol 50 mg QID # pills per month: 120 Last UDS date: 08/23/2022 Opioid Treatment Agreement signed (Y/N): Y Opioid Treatment Agreement last reviewed with patient:  11/21/22 NCCSRS reviewed this encounter (include red flags):  Y F/U : 3 months.    Next 3 rx sent

## 2022-11-22 NOTE — Progress Notes (Signed)
   Established Patient Office Visit  Subjective   Patient ID: Breanna Casey, female    DOB: Apr 30, 1956  Age: 66 y.o. MRN: [id number]  Chief Complaint  Patient presents with   Follow-up    HPI  Follow-up fibromyalgia-overall she feels like she is doing well on her regimen she is going to physical therapy regularly and still has several sessions left.  But she did talk with her physical therapist and is interested in doing a specific regimen for osteoporosis.  She would just need a new referral at that time.  She does need refills on her tramadol today.  We had tried to get authorization for her to get her Flovent covered for next year.  But insurance is not wanting to cover it.  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  But this is worked well for her for several years.    ROS    Objective:     There were no vitals taken for this visit.   Physical Exam   No results found for any visits on 11/22/22.    The 10-year ASCVD risk score (Arnett DK, et al., 2019) is: 8.2%    Assessment & Plan:   Problem List Items Addressed This Visit       Respiratory   Asthma, moderate persistent    Encouraged her to check with her pharmacist to see what she might be able to take that does not have a milk allergen.  Or we can even refer to asthma and allergy.  She will try to run the new prescription into next year and see what happens but it would likely not be covered unfortunately.        Other   Fibromyalgia    Stable on current regimen.      Relevant Medications   traMADol (ULTRAM) 50 MG tablet   traMADol (ULTRAM) 50 MG tablet (Start on 12/22/2022)   traMADol (ULTRAM) 50 MG tablet (Start on 01/21/2023)   Encounter for chronic pain management - Primary    Indication for chronic opioid: Fibromyalgia  Medication and dose: Tramadol 50 mg QID # pills per month: 120 Last UDS date: 08/23/2022 Opioid Treatment Agreement signed (Y/N): Y Opioid Treatment  Agreement last reviewed with patient:  11/21/22 NCCSRS reviewed this encounter (include red flags):  Y F/U : 3 months.    Next 3 rx sent      Relevant Medications   traMADol (ULTRAM) 50 MG tablet   traMADol (ULTRAM) 50 MG tablet (Start on 12/22/2022)   traMADol (ULTRAM) 50 MG tablet (Start on 01/21/2023)   Cervicalgia    Well with formal physical therapy.  When she completes her sessions I am happy to write a new order to particularly address her osteoporosis as well.       Declines screenings including mammograms and vaccines today.  Return in about 3 months (around 02/21/2023) for Fibro.    Beatrice Lecher, MD

## 2022-11-24 DIAGNOSIS — M542 Cervicalgia: Secondary | ICD-10-CM | POA: Diagnosis not present

## 2022-11-24 DIAGNOSIS — M545 Low back pain, unspecified: Secondary | ICD-10-CM | POA: Diagnosis not present

## 2022-11-24 DIAGNOSIS — M6281 Muscle weakness (generalized): Secondary | ICD-10-CM | POA: Diagnosis not present

## 2022-12-01 DIAGNOSIS — M545 Low back pain, unspecified: Secondary | ICD-10-CM | POA: Diagnosis not present

## 2022-12-01 DIAGNOSIS — M6281 Muscle weakness (generalized): Secondary | ICD-10-CM | POA: Diagnosis not present

## 2022-12-01 DIAGNOSIS — M542 Cervicalgia: Secondary | ICD-10-CM | POA: Diagnosis not present

## 2022-12-13 DIAGNOSIS — M542 Cervicalgia: Secondary | ICD-10-CM | POA: Diagnosis not present

## 2022-12-13 DIAGNOSIS — M6281 Muscle weakness (generalized): Secondary | ICD-10-CM | POA: Diagnosis not present

## 2022-12-13 DIAGNOSIS — M545 Low back pain, unspecified: Secondary | ICD-10-CM | POA: Diagnosis not present

## 2022-12-22 DIAGNOSIS — M545 Low back pain, unspecified: Secondary | ICD-10-CM | POA: Diagnosis not present

## 2022-12-22 DIAGNOSIS — M6281 Muscle weakness (generalized): Secondary | ICD-10-CM | POA: Diagnosis not present

## 2022-12-22 DIAGNOSIS — M542 Cervicalgia: Secondary | ICD-10-CM | POA: Diagnosis not present

## 2022-12-29 ENCOUNTER — Encounter: Payer: Self-pay | Admitting: Family Medicine

## 2022-12-29 MED ORDER — FLUTICASONE PROPIONATE HFA 110 MCG/ACT IN AERO: 2.0000 | INHALATION_SPRAY | Freq: Two times a day (BID) | RESPIRATORY_TRACT | 5 refills | Status: DC

## 2022-12-29 NOTE — Telephone Encounter (Signed)
Meds ordered this encounter  Medications   fluticasone (FLOVENT HFA) 110 MCG/ACT inhaler    Sig: Inhale 2 puffs into the lungs 2 (two) times daily. in the morning and at bedtime.    Dispense:  12 each    Refill:  5

## 2023-01-09 DIAGNOSIS — M545 Low back pain, unspecified: Secondary | ICD-10-CM | POA: Diagnosis not present

## 2023-01-09 DIAGNOSIS — M6281 Muscle weakness (generalized): Secondary | ICD-10-CM | POA: Diagnosis not present

## 2023-01-09 DIAGNOSIS — M542 Cervicalgia: Secondary | ICD-10-CM | POA: Diagnosis not present

## 2023-01-16 DIAGNOSIS — M542 Cervicalgia: Secondary | ICD-10-CM | POA: Diagnosis not present

## 2023-01-16 DIAGNOSIS — M545 Low back pain, unspecified: Secondary | ICD-10-CM | POA: Diagnosis not present

## 2023-01-16 DIAGNOSIS — M6281 Muscle weakness (generalized): Secondary | ICD-10-CM | POA: Diagnosis not present

## 2023-01-30 ENCOUNTER — Emergency Department (HOSPITAL_BASED_OUTPATIENT_CLINIC_OR_DEPARTMENT_OTHER)
Admission: EM | Admit: 2023-01-30 | Discharge: 2023-01-30 | Disposition: A | Discharge status: Home / Self Care | Payer: Medicare HMO | Attending: Emergency Medicine | Admitting: Emergency Medicine

## 2023-01-30 ENCOUNTER — Encounter (HOSPITAL_BASED_OUTPATIENT_CLINIC_OR_DEPARTMENT_OTHER): Payer: Self-pay | Admitting: Urology

## 2023-01-30 ENCOUNTER — Emergency Department (HOSPITAL_BASED_OUTPATIENT_CLINIC_OR_DEPARTMENT_OTHER): Payer: Medicare HMO

## 2023-01-30 DIAGNOSIS — R011 Cardiac murmur, unspecified: Secondary | ICD-10-CM | POA: Insufficient documentation

## 2023-01-30 DIAGNOSIS — Z91040 Latex allergy status: Secondary | ICD-10-CM | POA: Diagnosis not present

## 2023-01-30 DIAGNOSIS — J45909 Unspecified asthma, uncomplicated: Secondary | ICD-10-CM | POA: Diagnosis not present

## 2023-01-30 DIAGNOSIS — Z7951 Long term (current) use of inhaled steroids: Secondary | ICD-10-CM | POA: Diagnosis not present

## 2023-01-30 DIAGNOSIS — R079 Chest pain, unspecified: Secondary | ICD-10-CM | POA: Insufficient documentation

## 2023-01-30 DIAGNOSIS — I10 Essential (primary) hypertension: Secondary | ICD-10-CM

## 2023-01-30 DIAGNOSIS — R0789 Other chest pain: Secondary | ICD-10-CM | POA: Diagnosis not present

## 2023-01-30 LAB — CBC
HCT: 45.1 % (ref 36.0–46.0)
Hemoglobin: 15.5 g/dL — ABNORMAL HIGH (ref 12.0–15.0)
MCH: 29.2 pg (ref 26.0–34.0)
MCHC: 34.4 g/dL (ref 30.0–36.0)
MCV: 85.1 fL (ref 80.0–100.0)
Platelets: 267 10*3/uL (ref 150–400)
RBC: 5.3 MIL/uL — ABNORMAL HIGH (ref 3.87–5.11)
RDW: 12.1 % (ref 11.5–15.5)
WBC: 4.9 10*3/uL (ref 4.0–10.5)
nRBC: 0 % (ref 0.0–0.2)

## 2023-01-30 LAB — BASIC METABOLIC PANEL
Anion gap: 9 (ref 5–15)
BUN: 5 mg/dL — ABNORMAL LOW (ref 8–23)
CO2: 24 mmol/L (ref 22–32)
Calcium: 9.3 mg/dL (ref 8.9–10.3)
Chloride: 102 mmol/L (ref 98–111)
Creatinine, Ser: 0.63 mg/dL (ref 0.44–1.00)
GFR, Estimated: >60 mL/min (ref >60)
Glucose, Bld: 102 mg/dL — ABNORMAL HIGH (ref 70–99)
Potassium: 3.7 mmol/L (ref 3.5–5.1)
Sodium: 135 mmol/L (ref 135–145)

## 2023-01-30 LAB — TROPONIN I (HIGH SENSITIVITY)
Troponin I (High Sensitivity): <2 ng/L (ref <18)
Troponin I (High Sensitivity): <2 ng/L (ref <18)

## 2023-01-30 MED ORDER — VALSARTAN 80 MG PO TABS: 80.0000 mg | ORAL_TABLET | Freq: Every day | ORAL | 0 refills | Status: DC

## 2023-01-30 NOTE — ED Triage Notes (Signed)
Pt ambulatory to triage with c/o central chest pain that started Saturday , pain is intermittent  States lightheaded on Friday and BP was high 167/88

## 2023-01-30 NOTE — ED Provider Notes (Signed)
[facility] Provider Note   CSN: [id number] Arrival date & time: 01/30/23  1232     History  Chief Complaint  Patient presents with   Chest Pain    Breanna Casey is a 67 y.o. female.   Chest Pain    67 year old female with medical history significant for fibromyalgia, asthma, HLD, mitral valve prolapse, IBS who presents to the emergency department with chest discomfort.  The patient states that she developed chest pain that started on Saturday, located along her right and left chest with no other radiation.  She states the pain is intermittent comes and goes.  She felt lightheaded as well on Friday and noticed that her blood pressure was high at home at 167/88.  She is not on any antihypertensives and has no diagnosis of hypertension.  She denies any vision changes, focal numbness or weakness.  She endorsed pain down her right arm and leg.  She denies any abdominal pain, nausea or vomiting. No back pain.  Home Medications Prior to Admission medications   Medication Sig Start Date End Date Taking? Authorizing Provider  albuterol (VENTOLIN HFA) 108 (90 Base) MCG/ACT inhaler Inhale 2 puffs into the lungs every 6 (six) hours as needed for wheezing or shortness of breath. 04/23/20   Hali Marry, MD  Barberry-Oreg Grape-Goldenseal [id number] MG CAPS  12/06/15   [provider]  CITRUS BERGAMOT PO  07/05/20   [provider]  Coenzyme Q10 (CO Q 10 PO) Take by mouth.    [provider]  DIGESTIVE ENZYMES PO Take by mouth.    [provider]  FEVERFEW PO Take by mouth.    [provider]  fexofenadine (ALLEGRA) 180 MG tablet  02/03/19   [provider]  fish oil-omega-3 fatty acids 1000 MG capsule Take 2 g by mouth daily.    [provider]  fluticasone (FLOVENT HFA) 110 MCG/ACT inhaler Inhale 2 puffs into the lungs 2 (two) times daily. in the morning and at bedtime. 12/29/22    Hali Marry, MD  INOSITOL PO  09/04/18   [provider]  magnesium chloride 200 MG/ML SOLN  12/06/15   [provider]  Melatonin-Pyridoxine (MELATIN) 3-1 MG TABS  07/05/20   [provider]  Menaquinone-7 (VITAMIN K2 PO) Take 200 mg by mouth daily.    [provider]  Olive Leaf Extract 250 MG CAPS  02/17/20   [provider]  predniSONE (DELTASONE) 20 MG tablet Take 1 tablet twice a day for 5 days. For allergic reactions. 05/30/22   Hali Marry, MD  traMADol (ULTRAM) 50 MG tablet Take 1 tablet (50 mg total) by mouth every 6 (six) hours as needed. 11/22/22   Hali Marry, MD  traMADol (ULTRAM) 50 MG tablet Take 1 tablet (50 mg total) by mouth every 6 (six) hours as needed. 12/22/22   Hali Marry, MD  traMADol (ULTRAM) 50 MG tablet Take 1 tablet (50 mg total) by mouth every 6 (six) hours as needed. 01/21/23   Hali Marry, MD      Allergies    Aspirin, Butalbital-asa-caff-codeine, Butalbital-aspirin-caffeine, Cefdinir, Cimetidine, Ciprofloxacin, Clarithromycin, Codeine sulfate, Doxycycline, Erythromycin, Fiorinal-codeine #3  [butalbital-asa-caff-codeine], Flagyl [metronidazole], Glycopyrrolate, Hydrochlorothiazide, Hydrochlorothiazide w-triamterene, Hydrocodone-acetaminophen, Ibuprofen, Indomethacin, Iodinated contrast media, Ketorolac tromethamine, Latex, Meperidine and related, Meperidine hcl, Naproxen, Pb-hyoscy-atropine-scopolamine, Penicillins, Propoxyphene hcl, Propoxyphene n-acetaminophen, Sulfa antibiotics, Sulfonamide derivatives, Tessalon [benzonatate], Tetracycline, Triamterene, Tromethamine, Propantheline, Soy allergy, Sucralfate, Alcohol, Beef (bovine) protein, Butorphanol tartrate,  Eggs or egg-derived products, Nexium [esomeprazole magnesium], Pentazocine lactate, and Milk protein    Review of Systems   Review of Systems  Cardiovascular:  Positive for chest pain.  All other systems reviewed and are  negative.   Physical Exam Updated Vital Signs BP (S) (!) 179/92 (BP Location: Right Arm)   Pulse 77   Temp 98.1 F (36.7 C) (Oral)   Resp 14   Ht '5\' 6"'$  (1.676 m)   Wt 64.9 kg   SpO2 99%   BMI 23.09 kg/m  Physical Exam Vitals and nursing note reviewed.  Constitutional:      General: She is not in acute distress.    Appearance: She is well-developed.  HENT:     Head: Normocephalic and atraumatic.  Eyes:     Conjunctiva/sclera: Conjunctivae normal.  Cardiovascular:     Rate and Rhythm: Normal rate and regular rhythm.     Pulses: Normal pulses.     Heart sounds: Murmur heard.  Pulmonary:     Effort: Pulmonary effort is normal. No respiratory distress.     Breath sounds: Normal breath sounds.  Abdominal:     Palpations: Abdomen is soft.     Tenderness: There is no abdominal tenderness.  Musculoskeletal:        General: No swelling.     Cervical back: Neck supple.     Right lower leg: No edema.     Left lower leg: No edema.  Skin:    General: Skin is warm and dry.     Capillary Refill: Capillary refill takes less than 2 seconds.  Neurological:     General: No focal deficit present.     Mental Status: She is alert and oriented to person, place, and time. Mental status is at baseline.     Cranial Nerves: No cranial nerve deficit.     Sensory: No sensory deficit.     Motor: No weakness.  Psychiatric:        Mood and Affect: Mood normal.     ED Results / Procedures / Treatments   Labs (all labs ordered are listed, but only abnormal results are displayed) Labs Reviewed  BASIC METABOLIC PANEL - Abnormal; Notable for the following components:      Result Value   Glucose, Bld 102 (*)    BUN 5 (*)    All other components within normal limits  CBC - Abnormal; Notable for the following components:   RBC 5.30 (*)    Hemoglobin 15.5 (*)    All other components within normal limits  TROPONIN I (HIGH SENSITIVITY)  TROPONIN I (HIGH SENSITIVITY)    EKG EKG  Interpretation  Date/Time:  Monday January 30 2023 12:43:17 EST Ventricular Rate:  83 PR Interval:  112 QRS Duration: 88 QT Interval:  391 QTC Calculation: 460 R Axis:   34 Text Interpretation: Sinus rhythm Borderline short PR interval Low voltage, precordial leads RSR' in V1 or V2, right VCD or RVH Borderline T abnormalities, diffuse leads Confirmed by Regan Lemming ([id number]) on 01/30/2023 2:28:15 PM  Radiology DG Chest 2 View  Result Date: 01/30/2023 CLINICAL DATA:  chest pain EXAM: CHEST - 2 VIEW COMPARISON:  Chest x-ray Apr 13, 2018. FINDINGS: The heart size and mediastinal contours are within normal limits. Both lungs are clear. No visible pleural effusions or pneumothorax. No acute osseous abnormality. IMPRESSION: No active cardiopulmonary disease. Electronically Signed   By: Margaretha Sheffield M.D.   On: 01/30/2023 13:37    Procedures Procedures  Medications Ordered in ED Medications - No data to display  ED Course/ Medical Decision Making/ A&P             HEART Score: 4                Medical Decision Making Amount and/or Complexity of Data Reviewed Labs: ordered. Radiology: ordered.     67 year old female with medical history significant for fibromyalgia, asthma, HLD, mitral valve prolapse, IBS who presents to the emergency department with chest discomfort.  The patient states that she developed chest pain that started on Saturday, located along her right and left chest with no other radiation.  She states the pain is intermittent comes and goes.  She felt lightheaded as well on Friday and noticed that her blood pressure was high at home at 167/88.  She is not on any antihypertensives and has no diagnosis of hypertension.  She denies any vision changes, focal numbness or weakness.  She endorsed pain down her right arm and leg.  She denies any abdominal pain, nausea or vomiting. No back pain.  On arrival, the patient was afebrile, not tachycardic or tachypneic, sinus rhythm  noted on cardiac telemetry.  Hypertensive BP 218/92 on initial check, improved to 179/92 without intervention.  Saturating 100% on room air.  Physical exam significant for a well-appearing patient in no distress, lungs clear to auscultation bilaterally, neurologically intact, pulses intact.   Differential diagnosis includes: Hypertensive emergency/urgency, ACS, pneumonia, pneumothorax, pulmonary embolism,pericarditis/myocarditis, GERD, PUD, musculoskeletal. HEART score of 4, moderate risk. Patient not given ASA 325 mg, not given nitroglycerin as her pain had resolved.  EKG: Normal sinus rhythm with a rate of 83 with borderline T wave changes, no ST segment elevations. No concerning change from prior, T wave changes seen on prior EKGs  Lab results include: CBC without a leukocytosis, evidence of mild hemoconcentration with a hemoglobin of 15.5, BMP unremarkable, troponins x 2 negative   Imaging results include: Chest x-ray without focal airspace disease, no cardiac abnormality.  Labs unremarkable.  Symptoms not consistent with PE.  Unlikely pneumonia, no cough, no leukocytosis, no fevers, CXR and exam without acute findings. Unlikely pneumothorax, no findings on  CXR. Unlikely pericarditis/myocarditis, does not fit clinical picture. Chest pain not exertional. Unlikely dissection, no tearing chest pain, no neurologic complaints.  ACS ruled out with 2 negative troponins.  Patient is currently asymptomatic.  Blood pressure improved without intervention.  Discussed her hypertensive blood pressures here.  She has no evidence of endorgan damage.  She is not encephalopathic.  She has no pulmonary edema.  She has normal troponins and normal renal function.  She is moderate risk by heart score, considered admission for observation versus discharge and close outpatient follow-up with her PCP for consideration for cardiac stress testing.  The patient feels comfortable with that plan.  Discussed further management  of her hypertension with medications which the patient declined at this time.  Stable on repeat assessment, chest pain-free, stable for discharge.   Final Clinical Impression(s) / ED Diagnoses Final diagnoses:  Chest pain, unspecified type  Uncontrolled hypertension    Rx / DC Orders ED Discharge Orders          Ordered    valsartan (DIOVAN) 80 MG tablet  Daily,   Status:  Discontinued        01/30/23 1539    Ambulatory referral to Cardiology       Comments: If you have not heard from the Cardiology office within the next  72 hours please call [phone].   01/30/23 1555              Regan Lemming, MD 01/31/23 [phone]

## 2023-01-30 NOTE — Discharge Instructions (Addendum)
Please follow-up with your PCP regarding management of your high blood pressure.  Your EKG did not reveal any significant changes compared to prior EKGs, your chest x-ray was unremarkable and your laboratory workup was normal and reassuring to include 2 normal cardiac enzymes.  You did have elevated blood pressure here in the emergency department.  Return for any uncontrolled chest discomfort that does not resolve.  Consider outpatient cardiac stress testing to further stratify you for an adverse cardiac event.

## 2023-02-02 ENCOUNTER — Encounter: Payer: Self-pay | Admitting: Family Medicine

## 2023-02-02 ENCOUNTER — Ambulatory Visit (INDEPENDENT_AMBULATORY_CARE_PROVIDER_SITE_OTHER): Payer: Medicare HMO | Admitting: Family Medicine

## 2023-02-02 VITALS — BP 140/80 | HR 68 | Ht 64.0 in | Wt 143.0 lb

## 2023-02-02 DIAGNOSIS — R42 Dizziness and giddiness: Secondary | ICD-10-CM

## 2023-02-02 DIAGNOSIS — R1033 Periumbilical pain: Secondary | ICD-10-CM | POA: Diagnosis not present

## 2023-02-02 DIAGNOSIS — R0789 Other chest pain: Secondary | ICD-10-CM | POA: Diagnosis not present

## 2023-02-02 DIAGNOSIS — R030 Elevated blood-pressure reading, without diagnosis of hypertension: Secondary | ICD-10-CM | POA: Diagnosis not present

## 2023-02-02 NOTE — Progress Notes (Signed)
Established Patient Office Visit  Subjective   Patient ID: Breanna Casey, female    DOB: 1956/02/18  Age: 67 y.o. MRN: [id number]  Chief Complaint  Patient presents with   [facility] follow up   Chest Pain   Groin Pain    HPI  She says she started not feeling well last Thursday evening she just felt off a little bit.  Then Friday she was at the grocery store doing her normal routine and she started to feel lightheaded she had been there for probably less than 10 minutes.  Nothing was different that day and she was hydrated.  She said when she got home she checked her blood pressure because she thought maybe it could be a blood pressure related and it was high she monitored it over the weekend on Saturday and Sunday and it was also high.  She then called our office on Monday morning and was referred to the ED.  She says the chest pain started also over the weekend and she was having some intermittent pain on the right side and left side of her chest but alternating.  Also in the abdomen she was having a little bit of pain just to the side of the umbilicus.  Then she started having pain in the groin crease bilaterally on the right and left side.  She says the pain in the groin crease seems to have resolved but she still having the pain in the abdomen.  She is worried about the possibility of an aortic root aneurysm because she does have a family history.  Brother had an MI in his mid 32s.  She personally has no known history of coronary artery disease.  She denies feeling ill or sick or having a cough or fever etc. when the symptoms started last week.     ROS    Objective:     BP (!) 140/80 (BP Location: Left Arm, Patient Position: Standing, Cuff Size: Normal)   Pulse 68   Ht '5\' 4"'$  (1.626 m)   Wt 143 lb (64.9 kg)   SpO2 99%   BMI 24.55 kg/m    Physical Exam Vitals and nursing note reviewed.  Constitutional:      Appearance: She is well-developed.  HENT:      Head: Normocephalic and atraumatic.  Eyes:     Conjunctiva/sclera: Conjunctivae normal.  Cardiovascular:     Rate and Rhythm: Normal rate and regular rhythm.     Heart sounds: Normal heart sounds.  Pulmonary:     Effort: Pulmonary effort is normal.     Breath sounds: Normal breath sounds.  Skin:    General: Skin is warm and dry.     Coloration: Skin is not pale.  Neurological:     Mental Status: She is alert and oriented to person, place, and time.  Psychiatric:        Behavior: Behavior normal.      No results found for any visits on 02/02/23.    The 10-year ASCVD risk score (Arnett DK, et al., 2019) is: 9.8%    Assessment & Plan:   Problem List Items Addressed This Visit   None Visit Diagnoses     Atypical chest pain    -  Primary   Relevant Orders   ECHOCARDIOGRAM COMPLETE   US AORTA   Periumbilical pain       Relevant Orders   ECHOCARDIOGRAM COMPLETE   US AORTA   Dizziness  Relevant Orders   ECHOCARDIOGRAM COMPLETE   US AORTA   Elevated BP without diagnosis of hypertension       Relevant Orders   ECHOCARDIOGRAM COMPLETE   US AORTA       Atypical chest pain-unclear etiology.  According to the results normal EKG as well as chest x-ray.  Her blood pressure was quite elevated in the emergency room but it seems to have come down.  We did do orthostatics today because she felt a little lightheaded in the room and they were normal.  There her blood pressure did go up from her initial pressure.  We discussed scheduling her for an echocardiogram.  We also discussed possible workup with rest test.  Periumbilical pain-no bruits on exam slightly tender.  We discussed getting an ultrasound to evaluate for abdominal aortic aneurysm.  She is not a smoker.    No follow-ups on file.    Beatrice Lecher, MD

## 2023-02-03 ENCOUNTER — Encounter: Payer: Self-pay | Admitting: Family Medicine

## 2023-02-06 MED ORDER — FLUTICASONE PROPIONATE HFA 110 MCG/ACT IN AERO: 2.0000 | INHALATION_SPRAY | Freq: Two times a day (BID) | RESPIRATORY_TRACT | 5 refills | Status: DC

## 2023-02-06 NOTE — Telephone Encounter (Signed)
Printed and signed and placed in [employer]

## 2023-02-08 ENCOUNTER — Encounter: Payer: Self-pay | Admitting: Family Medicine

## 2023-02-13 ENCOUNTER — Ambulatory Visit (INDEPENDENT_AMBULATORY_CARE_PROVIDER_SITE_OTHER): Payer: Medicare HMO

## 2023-02-13 DIAGNOSIS — R0789 Other chest pain: Secondary | ICD-10-CM | POA: Diagnosis not present

## 2023-02-13 DIAGNOSIS — R42 Dizziness and giddiness: Secondary | ICD-10-CM

## 2023-02-13 DIAGNOSIS — Z136 Encounter for screening for cardiovascular disorders: Secondary | ICD-10-CM | POA: Diagnosis not present

## 2023-02-13 DIAGNOSIS — R030 Elevated blood-pressure reading, without diagnosis of hypertension: Secondary | ICD-10-CM | POA: Diagnosis not present

## 2023-02-13 DIAGNOSIS — R1033 Periumbilical pain: Secondary | ICD-10-CM

## 2023-02-15 ENCOUNTER — Telehealth: Payer: Self-pay | Admitting: Family Medicine

## 2023-02-15 NOTE — Progress Notes (Signed)
Breanna Casey, great news!  No sign of aortic aneurysm.

## 2023-02-15 NOTE — Telephone Encounter (Signed)
Called patient to schedule Medicare Annual Wellness Visit (AWV). Unable to reach patient.  Last date of AWV: 09/07/2018  Please schedule an appointment at any time with Nurse Health Advisor.  If any questions, please contact me at [phone].  Thank you ,  Lin Givens Patient Access Advocate II Direct Dial: [phone]

## 2023-02-23 ENCOUNTER — Ambulatory Visit: Payer: Medicare HMO | Admitting: Family Medicine

## 2023-02-24 ENCOUNTER — Ambulatory Visit (INDEPENDENT_AMBULATORY_CARE_PROVIDER_SITE_OTHER): Payer: Medicare HMO | Admitting: Family Medicine

## 2023-02-24 VITALS — BP 136/82 | HR 86 | Ht 64.0 in | Wt 142.0 lb

## 2023-02-24 DIAGNOSIS — M797 Fibromyalgia: Secondary | ICD-10-CM | POA: Diagnosis not present

## 2023-02-24 DIAGNOSIS — M545 Low back pain, unspecified: Secondary | ICD-10-CM

## 2023-02-24 DIAGNOSIS — G8929 Other chronic pain: Secondary | ICD-10-CM | POA: Diagnosis not present

## 2023-02-24 MED ORDER — TRAMADOL HCL 50 MG PO TABS: 50.0000 mg | ORAL_TABLET | Freq: Four times a day (QID) | ORAL | 2 refills | Status: DC | PRN

## 2023-02-24 NOTE — Progress Notes (Signed)
Established Patient Office Visit  Subjective   Patient ID: Breanna Casey, female    DOB: [birth year]  Age: 67 y.o. MRN: [id number]  Chief Complaint  Patient presents with   Pain Management    HPI Follow-up fibromyalgia.  She is currently on tramadol 4 times daily for pain control.  She had recently started physical therapy but in pain but after couple sessions that she started having increased pain and it has persisted since then.  She started having some chest discomfort and so quit going to PT until she could have that further evaluated.  Pain has been predominantly over her low back and wonders if it could be rating around to her groin as well.  Is been present for at least 2 months at this point.  The pain is daily and it is worse if she tries to rollover or even just sitting down or standing up on the toilet can be really painful.  She is also been under a lot of stress.  Her mom is currently hospitalized and just had a cardiac stent put in she is going to need a second 1 put him but right now is too weak for the procedure so they are just keeping her until next week.  She has been up there twice a day trying to help her out.  Her mom will likely come stay with her afterwards at least for a while.  Her mom is 98.  Last time I saw her she was having some chest pain and she says that actually feels much better.    ROS    Objective:     BP 136/82   Pulse 86   Ht 5\' 4"  (1.626 m)   Wt 142 lb (64.4 kg)   SpO2 99%   BMI 24.37 kg/m    Physical Exam Vitals and nursing note reviewed.  Constitutional:      Appearance: She is well-developed.  HENT:     Head: Normocephalic and atraumatic.  Cardiovascular:     Rate and Rhythm: Normal rate and regular rhythm.     Heart sounds: Normal heart sounds.  Pulmonary:     Effort: Pulmonary effort is normal.     Breath sounds: Normal breath sounds.  Skin:    General: Skin is warm and dry.  Neurological:     Mental Status: She is  alert and oriented to person, place, and time.  Psychiatric:        Behavior: Behavior normal.      No results found for any visits on 02/24/23.    The 10-year ASCVD risk score (Arnett DK, et al., 2019) is: 9.3%    Assessment & Plan:   Problem List Items Addressed This Visit       Other   Fibromyalgia    Updated pain contract today.  Will refill tramadol with 2 refills for 20-month supply.  Follow-up in 3 months.  UDS collected today.      Relevant Medications   traMADol (ULTRAM) 50 MG tablet   Other Relevant Orders   DRUG MONITORING, PANEL 6 WITH CONFIRMATION, URINE   Encounter for chronic pain management - Primary    Indication for chronic opioid: Fibromyalgia  Medication and dose: Tramadol 50 mg QID # pills per month: 120 Last UDS date: 02/23/2022 Opioid Treatment Agreement signed (Y/N): Y 02/24/23 Opioid Treatment Agreement last reviewed with patient:  11/21/22 NCCSRS reviewed this encounter (include red flags):  Y F/U : 3 months.    Rx with  2 RF send today.       Relevant Medications   traMADol (ULTRAM) 50 MG tablet   Other Relevant Orders   DRUG MONITORING, PANEL 6 WITH CONFIRMATION, URINE   Other Visit Diagnoses     Bilateral low back pain, unspecified chronicity, unspecified whether sciatica present       Relevant Medications   traMADol (ULTRAM) 50 MG tablet       BiLateral low back pain-I suspect with some radiculopathy.  Will refer to Dr. Genia Harold who she has seen previously for her shoulder.  She did start physical therapy and has been doing some stretches at home but it has not been getting any better and it has been present for 2 months at this point.  Return in about 3 months (around 05/27/2023) for Fibro meds.    Beatrice Lecher, MD

## 2023-02-24 NOTE — Assessment & Plan Note (Signed)
Updated pain contract today.  Will refill tramadol with 2 refills for 96-month supply.  Follow-up in 3 months.  UDS collected today.

## 2023-02-24 NOTE — Assessment & Plan Note (Signed)
Indication for chronic opioid: Fibromyalgia  Medication and dose: Tramadol 50 mg QID # pills per month: 120 Last UDS date: 02/23/2022 Opioid Treatment Agreement signed (Y/N): Y 02/24/23 Opioid Treatment Agreement last reviewed with patient:  11/21/22 NCCSRS reviewed this encounter (include red flags):  Y F/U : 3 months.    Rx with 2 RF send today.

## 2023-02-25 LAB — DRUG MONITORING, PANEL 6 WITH CONFIRMATION, URINE
6 Acetylmorphine: NEGATIVE ng/mL (ref <10)
Alcohol Metabolites: NEGATIVE ng/mL (ref <500)
Amphetamines: NEGATIVE ng/mL (ref <500)
Barbiturates: NEGATIVE ng/mL (ref <300)
Benzodiazepines: NEGATIVE ng/mL (ref <100)
Cocaine Metabolite: NEGATIVE ng/mL (ref <150)
Creatinine: 11.9 mg/dL — ABNORMAL LOW (ref >=20.0)
Marijuana Metabolite: NEGATIVE ng/mL (ref <20)
Methadone Metabolite: NEGATIVE ng/mL (ref <100)
Opiates: NEGATIVE ng/mL (ref <100)
Oxidant: NEGATIVE ug/mL (ref <200)
Oxycodone: NEGATIVE ng/mL (ref <100)
Phencyclidine: NEGATIVE ng/mL (ref <25)
Specific Gravity: 1.004 (ref >=1.003)
pH: 6.5 (ref 4.5–9.0)

## 2023-02-25 LAB — DM TEMPLATE

## 2023-03-01 ENCOUNTER — Ambulatory Visit (HOSPITAL_BASED_OUTPATIENT_CLINIC_OR_DEPARTMENT_OTHER): Payer: Medicare HMO

## 2023-03-07 ENCOUNTER — Ambulatory Visit (INDEPENDENT_AMBULATORY_CARE_PROVIDER_SITE_OTHER): Payer: Medicare HMO | Admitting: Sports Medicine

## 2023-03-07 ENCOUNTER — Other Ambulatory Visit (INDEPENDENT_AMBULATORY_CARE_PROVIDER_SITE_OTHER): Payer: Medicare HMO

## 2023-03-07 DIAGNOSIS — M7542 Impingement syndrome of left shoulder: Secondary | ICD-10-CM

## 2023-03-07 DIAGNOSIS — M545 Low back pain, unspecified: Secondary | ICD-10-CM | POA: Diagnosis not present

## 2023-03-07 DIAGNOSIS — G8929 Other chronic pain: Secondary | ICD-10-CM

## 2023-03-07 NOTE — Assessment & Plan Note (Signed)
Chronic axial low back pain, we did discuss this a year ago, x-rays were for the most part unrevealing, now she has pain right-sided paralumbar, she has had formal physical therapy now and has not had any improve it. She does desire interventional treatment today, I explained to her that we probably need an MRI to further delineate which disc, nerve root or facet joint was most likely the pain generator, patient declines MRI and would like me to do a blind injection today, she is aware that the injection may have miss the pain generator and result in no relief.

## 2023-03-07 NOTE — Assessment & Plan Note (Signed)
Breanna Casey has known left shoulder impingement syndrome, she had a subacromial injection back in March 2023, she had fantastic relief until recently, she desires another subacromial injection, we will do this today, I also need her to get more aggressive with her home conditioning for the rotator cuff.

## 2023-03-07 NOTE — Progress Notes (Signed)
    Procedures performed today:    Procedure: Real-time Ultrasound Guided injection of the left subacromial bursa Device: Samsung HS60  Verbal informed consent obtained.  Time-out conducted.  Noted no overlying erythema, induration, or other signs of local infection.  Skin prepped in a sterile fashion.  Local anesthesia: Topical Ethyl chloride.  With sterile technique and under real time ultrasound guidance: Noted intact rotator cuff, 1 cc Kenalog 40, 1 cc lidocaine, 1 cc bupivacaine injected easily Completed without difficulty  Advised to call if fevers/chills, erythema, induration, drainage, or persistent bleeding.  Images permanently stored and available for review in PACS.  Impression: Technically successful ultrasound guided injection.  Procedure:  Injection of right paralumbar trigger point Consent obtained and verified. Time-out conducted. Noted no overlying erythema, induration, or other signs of local infection. Skin prepped in a sterile fashion. Topical analgesic spray: Ethyl chloride. Completed without difficulty. Meds: 1 cc lidocaine, 1 cc kenalog 40 injected easily into the paralumbar muscle Advised to call if fevers/chills, erythema, induration, drainage, or persistent bleeding.  Independent interpretation of notes and tests performed by another provider:   None.  Brief History, Exam, Impression, and Recommendations:    Impingement syndrome, shoulder, left Breanna Casey has known left shoulder impingement syndrome, she had a subacromial injection back in March 2023, she had fantastic relief until recently, she desires another subacromial injection, we will do this today, I also need her to get more aggressive with her home conditioning for the rotator cuff.  Chronic low back pain without sciatica Chronic axial low back pain, we did discuss this a year ago, x-rays were for the most part unrevealing, now she has pain right-sided paralumbar, she has had formal physical therapy  now and has not had any improve it. She does desire interventional treatment today, I explained to her that we probably need an MRI to further delineate which disc, nerve root or facet joint was most likely the pain generator, patient declines MRI and would like me to do a blind injection today, she is aware that the injection may have miss the pain generator and result in no relief.    ____________________________________________ Gwen Her. Dianah Field, M.D., ABFM., CAQSM., AME. Primary Care and Sports Medicine [facility]  Adjunct Professor of [employer]  Risk manager

## 2023-03-22 ENCOUNTER — Ambulatory Visit (HOSPITAL_BASED_OUTPATIENT_CLINIC_OR_DEPARTMENT_OTHER)
Admission: RE | Admit: 2023-03-22 | Discharge: 2023-03-22 | Disposition: A | Discharge status: Home / Self Care | Payer: Medicare HMO | Source: Ambulatory Visit | Attending: Family Medicine | Admitting: Family Medicine

## 2023-03-22 DIAGNOSIS — R030 Elevated blood-pressure reading, without diagnosis of hypertension: Secondary | ICD-10-CM | POA: Diagnosis not present

## 2023-03-22 DIAGNOSIS — R0789 Other chest pain: Secondary | ICD-10-CM | POA: Insufficient documentation

## 2023-03-22 DIAGNOSIS — R1033 Periumbilical pain: Secondary | ICD-10-CM | POA: Diagnosis not present

## 2023-03-22 DIAGNOSIS — R42 Dizziness and giddiness: Secondary | ICD-10-CM | POA: Diagnosis not present

## 2023-03-22 LAB — ECHOCARDIOGRAM COMPLETE
Area-P 1/2: 3.99 cm2
MV M vel: 4.67 m/s
MV Peak grad: 87.2 mmHg
S' Lateral: 3.3 cm

## 2023-03-24 NOTE — Progress Notes (Signed)
Hi Breanna Casey, echocardiogram of your heart shows normal pumping function overall.  They did note an abnormality with nodular to general strain.  This is an extra parameter that they can measure to evaluate that pumping function.  This particular measure was slightly abnormal.  I do not think this is causing any of your symptoms currently.  But I do think we should plan to recheck your heart in about 3 years with another echocardiogram.  We also just need to make sure that work keeping your blood pressure under good control..  No wall motion abnormalities.  This is a good sign that there is not been any damage to the heart muscle.  The aortic valve looks good.  The mitral valve overall looks good there is just a little bit of backflow on it but nothing concerning.  And nothing that should be causing any symptoms or concern.  If you are still having the chest pain and the dizziness then I would like to refer you to cardiology for further workup.  Let us know if you have a provider or location preference.

## 2023-06-02 ENCOUNTER — Ambulatory Visit (INDEPENDENT_AMBULATORY_CARE_PROVIDER_SITE_OTHER): Payer: Medicare HMO | Admitting: Family Medicine

## 2023-06-02 ENCOUNTER — Encounter: Payer: Self-pay | Admitting: Family Medicine

## 2023-06-02 VITALS — BP 130/62 | HR 88 | Ht 64.0 in | Wt 133.0 lb

## 2023-06-02 DIAGNOSIS — B372 Candidiasis of skin and nail: Secondary | ICD-10-CM

## 2023-06-02 DIAGNOSIS — M797 Fibromyalgia: Secondary | ICD-10-CM | POA: Diagnosis not present

## 2023-06-02 DIAGNOSIS — H9201 Otalgia, right ear: Secondary | ICD-10-CM

## 2023-06-02 DIAGNOSIS — I878 Other specified disorders of veins: Secondary | ICD-10-CM

## 2023-06-02 MED ORDER — CIPRO HC 0.2-1 % OT SUSP: 3.0000 [drp] | Freq: Two times a day (BID) | OTIC | 0 refills | Status: DC

## 2023-06-02 MED ORDER — FLUCONAZOLE 150 MG PO TABS: 150.0000 mg | ORAL_TABLET | Freq: Once | ORAL | 0 refills | Status: DC

## 2023-06-02 NOTE — Progress Notes (Signed)
Established Patient Office Visit  Subjective   Patient ID: Breanna Casey, female    DOB: 1956-06-27  Age: 67 y.o. MRN: [id number]  Chief Complaint  Patient presents with   Pain Management    HPI  A few different concerns that she would like to discuss today.  She has been having some right ear pain for about 2 days.  No drainage.  No fevers or chills.  She has had occasional whooshing sound in her ear.  She is also had a little bit of sneezing and a slight intermittent cough.  No painful throat.  No sick contacts.  She also feels like she has a pelvic and vaginal yeast infection again she says it happens every time this time a year when she starts eating peaches.  She would like to have a prescription for Diflucan.  She did have a question about one of the results on her echocardiogram.  We also wrote her out of jury duty about 3 years ago but received another letter for jury duty.  She would like [place] to reprint the original letter so that she can include it in her jury duty paperwork.  She had been doing some research on her own and saw that tramadol can increase risk for dementia and she has a strong family history of that and has decided to wean herself off of tramadol.  But now she is having increased pain and would like to be referred for formal physical therapy she would be like to be seen at [facility] and to see [facility].  She is interested in possibly trying cupping.  She has tried some dry needling in the past.    ROS    Objective:     BP 130/62   Pulse 88   Ht 5\' 4"  (1.626 m)   Wt 133 lb (60.3 kg)   SpO2 99%   BMI 22.83 kg/m    Physical Exam Constitutional:      Appearance: She is well-developed.  HENT:     Head: Normocephalic and atraumatic.     Right Ear: Tympanic membrane and external ear normal.     Left Ear: Tympanic membrane, ear canal and external ear normal.     Ears:     Comments: Canal is very erythematous.    Nose: Nose normal.      Mouth/Throat:     Pharynx: No oropharyngeal exudate or posterior oropharyngeal erythema.  Eyes:     Conjunctiva/sclera: Conjunctivae normal.     Pupils: Pupils are equal, round, and reactive to light.  Neck:     Thyroid: No thyromegaly.     Comments: Tenderness on the left anterior side of the neck. Cardiovascular:     Rate and Rhythm: Normal rate and regular rhythm.     Heart sounds: Normal heart sounds.  Pulmonary:     Effort: Pulmonary effort is normal.     Breath sounds: Normal breath sounds. No wheezing.  Musculoskeletal:     Cervical back: Neck supple. Tenderness present.  Lymphadenopathy:     Cervical: No cervical adenopathy.  Skin:    General: Skin is warm and dry.  Neurological:     Mental Status: She is alert and oriented to person, place, and time.      No results found for any visits on 06/02/23.    The 10-year ASCVD risk score (Arnett DK, et al., 2019) is: 8.5%    Assessment & Plan:   Problem List Items Addressed This Visit  Other   Venous stasis    Wears her compression stockings consistently.      Fibromyalgia    She is tapering herself off of the tramadol she has a few tabs left.  Went ahead and removed it from her medication lift.  I think that is fantastic if we can find a way for her to manage her pain without being on chronic pain medication.  Physical therapy referral placed.      Relevant Orders   Ambulatory referral to Physical Therapy   Other Visit Diagnoses     Skin candidiasis    -  Primary   Relevant Medications   fluconazole (DIFLUCAN) 150 MG tablet   Right ear pain       Relevant Medications   ciprofloxacin-hydrocortisone (CIPRO HC) OTIC suspension       Jury duty from 2021 reprinted and provided for her today.  She can include with her excuse letter.  Right ear pain-she does have some erythema of the canal but no issues with the tympanic membrane itself.  Will send over prescription for Cipro HC.  I do think she may  also have some component of postnasal drip to explain the slight cough and sneezing.  She is a little bit tender on the left side of her anterior neck.  Encouraged her to avoid putting anything in the ear for the next week she does tend to use Q-tips daily.  Return in about 3 months (around 09/02/2023) for Fibromyalgia.    Nani Gasser, MD

## 2023-06-02 NOTE — Assessment & Plan Note (Signed)
She is tapering herself off of the tramadol she has a few tabs left.  Went ahead and removed it from her medication lift.  I think that is fantastic if we can find a way for her to manage her pain without being on chronic pain medication.  Physical therapy referral placed.

## 2023-06-02 NOTE — Assessment & Plan Note (Addendum)
Wears her compression stockings consistently.

## 2023-06-05 ENCOUNTER — Encounter: Payer: Self-pay | Admitting: Family Medicine

## 2023-06-05 MED ORDER — ACETIC ACID 2 % OT SOLN: 4.0000 [drp] | Freq: Three times a day (TID) | OTIC | 0 refills | Status: DC

## 2023-06-05 NOTE — Telephone Encounter (Signed)
Meds ordered this encounter  Medications   acetic acid 2 % otic solution    Sig: Place 4 drops into the right ear 3 (three) times daily. X 5-7 days    Dispense:  15 mL    Refill:  0

## 2023-06-15 DIAGNOSIS — M797 Fibromyalgia: Secondary | ICD-10-CM | POA: Diagnosis not present

## 2023-06-15 DIAGNOSIS — M6281 Muscle weakness (generalized): Secondary | ICD-10-CM | POA: Diagnosis not present

## 2023-06-15 DIAGNOSIS — M25512 Pain in left shoulder: Secondary | ICD-10-CM | POA: Diagnosis not present

## 2023-06-19 DIAGNOSIS — M25512 Pain in left shoulder: Secondary | ICD-10-CM | POA: Diagnosis not present

## 2023-06-19 DIAGNOSIS — M6281 Muscle weakness (generalized): Secondary | ICD-10-CM | POA: Diagnosis not present

## 2023-06-19 DIAGNOSIS — M797 Fibromyalgia: Secondary | ICD-10-CM | POA: Diagnosis not present

## 2023-07-03 DIAGNOSIS — M25512 Pain in left shoulder: Secondary | ICD-10-CM | POA: Diagnosis not present

## 2023-07-03 DIAGNOSIS — M797 Fibromyalgia: Secondary | ICD-10-CM | POA: Diagnosis not present

## 2023-07-03 DIAGNOSIS — M6281 Muscle weakness (generalized): Secondary | ICD-10-CM | POA: Diagnosis not present

## 2023-07-10 DIAGNOSIS — M797 Fibromyalgia: Secondary | ICD-10-CM | POA: Diagnosis not present

## 2023-07-10 DIAGNOSIS — M25512 Pain in left shoulder: Secondary | ICD-10-CM | POA: Diagnosis not present

## 2023-07-10 DIAGNOSIS — M6281 Muscle weakness (generalized): Secondary | ICD-10-CM | POA: Diagnosis not present

## 2023-07-17 DIAGNOSIS — M25512 Pain in left shoulder: Secondary | ICD-10-CM | POA: Diagnosis not present

## 2023-07-17 DIAGNOSIS — M6281 Muscle weakness (generalized): Secondary | ICD-10-CM | POA: Diagnosis not present

## 2023-07-17 DIAGNOSIS — M797 Fibromyalgia: Secondary | ICD-10-CM | POA: Diagnosis not present

## 2023-07-20 DIAGNOSIS — M6281 Muscle weakness (generalized): Secondary | ICD-10-CM | POA: Diagnosis not present

## 2023-07-20 DIAGNOSIS — M25512 Pain in left shoulder: Secondary | ICD-10-CM | POA: Diagnosis not present

## 2023-07-20 DIAGNOSIS — M797 Fibromyalgia: Secondary | ICD-10-CM | POA: Diagnosis not present

## 2023-08-02 DIAGNOSIS — L700 Acne vulgaris: Secondary | ICD-10-CM | POA: Diagnosis not present

## 2023-08-02 DIAGNOSIS — L821 Other seborrheic keratosis: Secondary | ICD-10-CM | POA: Diagnosis not present

## 2023-08-02 DIAGNOSIS — D225 Melanocytic nevi of trunk: Secondary | ICD-10-CM | POA: Diagnosis not present

## 2023-08-02 DIAGNOSIS — D1722 Benign lipomatous neoplasm of skin and subcutaneous tissue of left arm: Secondary | ICD-10-CM | POA: Diagnosis not present

## 2023-08-02 DIAGNOSIS — D1721 Benign lipomatous neoplasm of skin and subcutaneous tissue of right arm: Secondary | ICD-10-CM | POA: Diagnosis not present

## 2023-08-02 DIAGNOSIS — Z85820 Personal history of malignant melanoma of skin: Secondary | ICD-10-CM | POA: Diagnosis not present

## 2023-08-09 DIAGNOSIS — M797 Fibromyalgia: Secondary | ICD-10-CM | POA: Diagnosis not present

## 2023-08-09 DIAGNOSIS — M25512 Pain in left shoulder: Secondary | ICD-10-CM | POA: Diagnosis not present

## 2023-08-09 DIAGNOSIS — M6281 Muscle weakness (generalized): Secondary | ICD-10-CM | POA: Diagnosis not present

## 2023-08-15 ENCOUNTER — Other Ambulatory Visit: Payer: Self-pay | Admitting: Medical Genetics

## 2023-08-15 DIAGNOSIS — Z006 Encounter for examination for normal comparison and control in clinical research program: Secondary | ICD-10-CM

## 2023-08-15 DIAGNOSIS — M6281 Muscle weakness (generalized): Secondary | ICD-10-CM | POA: Diagnosis not present

## 2023-08-15 DIAGNOSIS — M25512 Pain in left shoulder: Secondary | ICD-10-CM | POA: Diagnosis not present

## 2023-08-15 DIAGNOSIS — M797 Fibromyalgia: Secondary | ICD-10-CM | POA: Diagnosis not present

## 2023-08-29 DIAGNOSIS — M25512 Pain in left shoulder: Secondary | ICD-10-CM | POA: Diagnosis not present

## 2023-08-29 DIAGNOSIS — M797 Fibromyalgia: Secondary | ICD-10-CM | POA: Diagnosis not present

## 2023-08-29 DIAGNOSIS — M6281 Muscle weakness (generalized): Secondary | ICD-10-CM | POA: Diagnosis not present

## 2023-09-05 ENCOUNTER — Ambulatory Visit (INDEPENDENT_AMBULATORY_CARE_PROVIDER_SITE_OTHER): Payer: Medicare HMO | Admitting: Family Medicine

## 2023-09-05 ENCOUNTER — Encounter: Payer: Self-pay | Admitting: Family Medicine

## 2023-09-05 VITALS — BP 128/72 | HR 72 | Ht 64.0 in | Wt 136.0 lb

## 2023-09-05 DIAGNOSIS — R0789 Other chest pain: Secondary | ICD-10-CM

## 2023-09-05 DIAGNOSIS — Z8249 Family history of ischemic heart disease and other diseases of the circulatory system: Secondary | ICD-10-CM | POA: Diagnosis not present

## 2023-09-05 DIAGNOSIS — J4520 Mild intermittent asthma, uncomplicated: Secondary | ICD-10-CM

## 2023-09-05 DIAGNOSIS — J4530 Mild persistent asthma, uncomplicated: Secondary | ICD-10-CM

## 2023-09-05 DIAGNOSIS — M797 Fibromyalgia: Secondary | ICD-10-CM

## 2023-09-05 DIAGNOSIS — G8929 Other chronic pain: Secondary | ICD-10-CM

## 2023-09-05 MED ORDER — ALBUTEROL SULFATE HFA 108 (90 BASE) MCG/ACT IN AERS: 2.0000 | INHALATION_SPRAY | Freq: Four times a day (QID) | RESPIRATORY_TRACT | 1 refills | Status: AC | PRN

## 2023-09-05 NOTE — Assessment & Plan Note (Signed)
Will refill her albuterol.

## 2023-09-05 NOTE — Assessment & Plan Note (Addendum)
Indication for chronic opioid: Fibromyalgia  Medication and dose: Tramadol 50 mg QID # pills per month: 30, uses rarely. Last filled in March.   Last UDS date: 02/23/2022 Opioid Treatment Agreement signed (Y/N): Y 02/24/23 Opioid Treatment Agreement last reviewed with patient:  11/21/22 NCCSRS reviewed this encounter (include red flags):  Y F/U : 6 months.

## 2023-09-05 NOTE — Progress Notes (Signed)
Established Patient Office Visit  Subjective   Patient ID: Breanna Casey, female    DOB: 08-22-1956  Age: 67 y.o. MRN: [id number]  Chief Complaint  Patient presents with   Pain Management    HPI Here for chronic pain management follow-up for fibromyalgia and low back pain.  She has been trying to come off of the tramadol completely but does take a tab occasionally when her pain is more intense.  She was working with a great physical therapist who is really helping her manage her fibro until she moved to [place].  They did assign her to someone new but it has not been nearly as helpful so she is, finish out PT for this month and then if she still having persistent pain she is can a try to get back in with Dr. Benjamin Stain to discuss injections which she has done in the past.  Her allergies have really been flaring this fall she has had a lot of postnasal drip and sore throat also about a week or so ago she had an upper respiratory infection.  Several family members were sick with it and she says it really flared her asthma for a couple of days.  She noted that her butyryl was not working as well as it had in the past and when she checked the day on it she realized it expired in 2022.  She has noticed just a pinpoint right lower abdominal pain when she coughs.  Not triggered by anything else.   Did bring a list of blood test that she would like to consider to better risk stratify her for cardiovascular disease.  Her mom had heart disease and so she is worried about it and wants to be as aggressive as possible in treating this.     ROS    Objective:     BP 128/72   Pulse 72   Ht 5\' 4"  (1.626 m)   Wt 136 lb (61.7 kg)   SpO2 100%   BMI 23.34 kg/m    Physical Exam Vitals and nursing note reviewed.  Constitutional:      Appearance: Normal appearance.  HENT:     Head: Normocephalic and atraumatic.  Eyes:     Conjunctiva/sclera: Conjunctivae normal.  Cardiovascular:     Rate  and Rhythm: Normal rate and regular rhythm.  Pulmonary:     Effort: Pulmonary effort is normal.     Breath sounds: Normal breath sounds.  Skin:    General: Skin is warm and dry.  Neurological:     Mental Status: She is alert.  Psychiatric:        Mood and Affect: Mood normal.      No results found for any visits on 09/05/23.    The 10-year ASCVD risk score (Arnett DK, et al., 2019) is: 9.2%    Assessment & Plan:   Problem List Items Addressed This Visit       Respiratory   Asthma, mild persistent    Will refill her albuterol.        Relevant Medications   albuterol (VENTOLIN HFA) 108 (90 Base) MCG/ACT inhaler     Other   Fibromyalgia    Will finish out PT with new provider for the rest of this month.      Relevant Medications   traMADol (ULTRAM) 50 MG tablet   Encounter for chronic pain management    Indication for chronic opioid: Fibromyalgia  Medication and dose: Tramadol 50 mg QID # pills  per month: 30, uses rarely. Last filled in March.   Last UDS date: 02/23/2022 Opioid Treatment Agreement signed (Y/N): Y 02/24/23 Opioid Treatment Agreement last reviewed with patient:  11/21/22 NCCSRS reviewed this encounter (include red flags):  Y F/U : 6 months.            Other Visit Diagnoses     Atypical chest pain    -  Primary   Relevant Orders   Apolipoprotein B   CRP High sensitivity   CT CARDIAC SCORING (SELF PAY ONLY)   Lipoprotein A (LPA)   Coenzyme Q10, Total   OmegaCheck(TM) (EPA+DPA+DHA)   Mild intermittent asthma, unspecified whether complicated       Relevant Medications   albuterol (VENTOLIN HFA) 108 (90 Base) MCG/ACT inhaler   Family history of heart disease       Relevant Orders   Apolipoprotein B   CRP High sensitivity   CT CARDIAC SCORING (SELF PAY ONLY)   Lipoprotein A (LPA)   Coenzyme Q10, Total   OmegaCheck(TM) (EPA+DPA+DHA)       Return in about 6 months (around 03/05/2024).    Nani Gasser, MD

## 2023-09-05 NOTE — Assessment & Plan Note (Signed)
Will finish out PT with new provider for the rest of this month.

## 2023-09-07 DIAGNOSIS — M6281 Muscle weakness (generalized): Secondary | ICD-10-CM | POA: Diagnosis not present

## 2023-09-07 DIAGNOSIS — M25512 Pain in left shoulder: Secondary | ICD-10-CM | POA: Diagnosis not present

## 2023-09-07 DIAGNOSIS — M797 Fibromyalgia: Secondary | ICD-10-CM | POA: Diagnosis not present

## 2023-09-11 ENCOUNTER — Ambulatory Visit (INDEPENDENT_AMBULATORY_CARE_PROVIDER_SITE_OTHER): Payer: Self-pay

## 2023-09-11 DIAGNOSIS — R0789 Other chest pain: Secondary | ICD-10-CM

## 2023-09-11 DIAGNOSIS — Z8249 Family history of ischemic heart disease and other diseases of the circulatory system: Secondary | ICD-10-CM

## 2023-09-12 DIAGNOSIS — R0789 Other chest pain: Secondary | ICD-10-CM | POA: Diagnosis not present

## 2023-09-12 DIAGNOSIS — Z8249 Family history of ischemic heart disease and other diseases of the circulatory system: Secondary | ICD-10-CM | POA: Diagnosis not present

## 2023-09-12 NOTE — Progress Notes (Signed)
Hi Breanna Casey, your coronary calcium score was 3.3 this puts you in the 27th percentile for females your age.  If your score is greater than 1 then it is reasonable to start a cholesterol-lowering medication, a statin to reduce your cardiovascular risk for heart attack and stroke. If you are okay with starting a statin then please let me know.

## 2023-09-13 DIAGNOSIS — M25512 Pain in left shoulder: Secondary | ICD-10-CM | POA: Diagnosis not present

## 2023-09-13 DIAGNOSIS — M6281 Muscle weakness (generalized): Secondary | ICD-10-CM | POA: Diagnosis not present

## 2023-09-13 DIAGNOSIS — M797 Fibromyalgia: Secondary | ICD-10-CM | POA: Diagnosis not present

## 2023-09-14 ENCOUNTER — Encounter: Payer: Self-pay | Admitting: Family Medicine

## 2023-09-14 NOTE — Telephone Encounter (Signed)
Can work on heart healthy diet =- mediterranean diet.

## 2023-09-15 ENCOUNTER — Encounter: Payer: Self-pay | Admitting: Family Medicine

## 2023-09-15 ENCOUNTER — Encounter (INDEPENDENT_AMBULATORY_CARE_PROVIDER_SITE_OTHER): Payer: Self-pay

## 2023-09-19 NOTE — Progress Notes (Signed)
Hi Mairlyn, your apolipoprotein B is elevated in the high risk category for cardiovascular disease.  Your coenzyme Q 10 was elevated.  So you do not have any count of deficiency which is good.  The CRP which is a type of inflammatory marker was normal the lipoprotein a was also normal.  The Omega check is still pending.

## 2023-09-19 NOTE — Progress Notes (Signed)
Hi Breanna Casey, there are 2 pieces to this test the first is the coronary calcium score.  Your score was 3.3 this is in the 27th percentile for age, race and sex.  This puts you into an intermediate risk category where it is reasonable to start a statin since you are over the age of 78.  They also do an over read of the surrounding tissues and they did not see anything worrisome in the chest.  So would encourage you to still consider starting a statin.  I know you had said you really wanted to focus on diet and exercise first.

## 2023-09-21 DIAGNOSIS — M25512 Pain in left shoulder: Secondary | ICD-10-CM | POA: Diagnosis not present

## 2023-09-21 DIAGNOSIS — M6281 Muscle weakness (generalized): Secondary | ICD-10-CM | POA: Diagnosis not present

## 2023-09-21 DIAGNOSIS — M797 Fibromyalgia: Secondary | ICD-10-CM | POA: Diagnosis not present

## 2023-09-23 LAB — OMEGACHECK(TM) (EPA+DPA+DHA)
Arachidonic Acid/EPA Ratio: 6.4 (ref 3.7–40.7)
Arachidonic Acid: 10.6 %{total} (ref 8.6–15.6)
DHA: 3.7 %{total} (ref 1.4–5.1)
DPA: 1.2 %{total} (ref 0.8–1.8)
EPA: 1.7 %{total} (ref 0.2–2.3)
Linoleic Acid: 25.6 %{total} (ref 18.6–29.5)
Omega-3 total: 6.6 %{total}
Omega-6 total: 38.9 %{total}
Omega-6/Omega-3 Ratio: 5.9 (ref 3.7–14.4)
OmegaCheck(TM): 6.6 %{total} (ref >5.4)

## 2023-09-23 LAB — COENZYME Q10, TOTAL: Coenzyme Q10, Total: 5.95 ug/mL — ABNORMAL HIGH (ref 0.37–2.20)

## 2023-09-23 LAB — APOLIPOPROTEIN B: Apolipoprotein B: 137 mg/dL — ABNORMAL HIGH (ref <90)

## 2023-09-23 LAB — LIPOPROTEIN A (LPA): Lipoprotein (a): 14.2 nmol/L (ref <75.0)

## 2023-09-23 LAB — HIGH SENSITIVITY CRP: CRP, High Sensitivity: 0.52 mg/L (ref 0.00–3.00)

## 2023-09-25 NOTE — Progress Notes (Signed)
The last test which was the Omega check came back greater than 5.4 which is good.  This actually shows a lower risk for sudden cardiac death which is great.

## 2023-09-27 DIAGNOSIS — M797 Fibromyalgia: Secondary | ICD-10-CM | POA: Diagnosis not present

## 2023-09-27 DIAGNOSIS — M6281 Muscle weakness (generalized): Secondary | ICD-10-CM | POA: Diagnosis not present

## 2023-09-27 DIAGNOSIS — M25512 Pain in left shoulder: Secondary | ICD-10-CM | POA: Diagnosis not present

## 2023-10-03 ENCOUNTER — Encounter: Payer: Self-pay | Admitting: Family Medicine

## 2023-10-25 ENCOUNTER — Encounter: Payer: Self-pay | Admitting: Family Medicine

## 2023-10-25 DIAGNOSIS — H524 Presbyopia: Secondary | ICD-10-CM | POA: Diagnosis not present

## 2023-10-26 MED ORDER — FLUTICASONE PROPIONATE HFA 110 MCG/ACT IN AERO: 2.0000 | INHALATION_SPRAY | Freq: Two times a day (BID) | RESPIRATORY_TRACT | 5 refills | Status: DC

## 2023-10-27 NOTE — Telephone Encounter (Signed)
Signed by me and faxed by Rica Mote today

## 2023-10-31 ENCOUNTER — Ambulatory Visit: Payer: Medicare HMO | Admitting: Sports Medicine

## 2023-11-04 ENCOUNTER — Emergency Department (HOSPITAL_BASED_OUTPATIENT_CLINIC_OR_DEPARTMENT_OTHER)
Admission: EM | Admit: 2023-11-04 | Discharge: 2023-11-04 | Disposition: A | Discharge status: Home / Self Care | Payer: Medicare HMO

## 2023-11-04 ENCOUNTER — Emergency Department (HOSPITAL_BASED_OUTPATIENT_CLINIC_OR_DEPARTMENT_OTHER): Payer: Medicare HMO

## 2023-11-04 ENCOUNTER — Encounter (HOSPITAL_BASED_OUTPATIENT_CLINIC_OR_DEPARTMENT_OTHER): Payer: Self-pay

## 2023-11-04 ENCOUNTER — Other Ambulatory Visit: Payer: Self-pay

## 2023-11-04 DIAGNOSIS — R079 Chest pain, unspecified: Secondary | ICD-10-CM | POA: Diagnosis not present

## 2023-11-04 DIAGNOSIS — Z91040 Latex allergy status: Secondary | ICD-10-CM | POA: Diagnosis not present

## 2023-11-04 DIAGNOSIS — R0789 Other chest pain: Secondary | ICD-10-CM | POA: Insufficient documentation

## 2023-11-04 LAB — CBC
HCT: 40.5 % (ref 36.0–46.0)
Hemoglobin: 13.6 g/dL (ref 12.0–15.0)
MCH: 29.2 pg (ref 26.0–34.0)
MCHC: 33.6 g/dL (ref 30.0–36.0)
MCV: 87.1 fL (ref 80.0–100.0)
Platelets: 290 10*3/uL (ref 150–400)
RBC: 4.65 MIL/uL (ref 3.87–5.11)
RDW: 12.2 % (ref 11.5–15.5)
WBC: 6.7 10*3/uL (ref 4.0–10.5)
nRBC: 0 % (ref 0.0–0.2)

## 2023-11-04 LAB — I-STAT CHEM 8, ED
BUN: 5 mg/dL — ABNORMAL LOW (ref 8–23)
Calcium, Ion: 1.16 mmol/L (ref 1.15–1.40)
Chloride: 104 mmol/L (ref 98–111)
Creatinine, Ser: 0.6 mg/dL (ref 0.44–1.00)
Glucose, Bld: 91 mg/dL (ref 70–99)
HCT: 40 % (ref 36.0–46.0)
Hemoglobin: 13.6 g/dL (ref 12.0–15.0)
Potassium: 3.8 mmol/L (ref 3.5–5.1)
Sodium: 140 mmol/L (ref 135–145)
TCO2: 24 mmol/L (ref 22–32)

## 2023-11-04 LAB — TROPONIN I (HIGH SENSITIVITY)
Troponin I (High Sensitivity): <2 ng/L (ref <18)
Troponin I (High Sensitivity): <2 ng/L (ref <18)

## 2023-11-04 NOTE — ED Provider Notes (Signed)
Patient is a handoff from [place], [place]. Plan is for repeat troponin as patient is medium risk heart score. If repeat troponin is negative, can discharge home with PCP follow up. Physical Exam  BP (!) 158/77   Pulse 71   Temp 98.7 F (37.1 C) (Oral)   Resp 14   Ht 5\' 4"  (1.626 m)   Wt 61.7 kg   SpO2 100%   BMI 23.34 kg/m   Physical Exam  Procedures  Procedures  ED Course / MDM   Clinical Course as of 11/04/23 1851  Sat Nov 04, 2023  1755 BP(!): 216/87 [AH]  1755 BP(!): 158/77 [AH]  1755 I-stat chem 8, ED (not at [facility], [facility] or [facility])(!) [AH]  1755 Troponin I (High Sensitivity): <2 [AH]  1755 CBC [AH]  1755 DG Chest 2 View [AH]  1755 I personally visualized and interpreted the images using our PACS system. Acute findings include:  none  [AH]  1755 ED EKG [AH]  1756 EKG shows NSR rate of 79- nonischemic  [AH]  1833 Troponin I (High Sensitivity) [AH]    Clinical Course User Index [AH] Arthor Captain, PA-C   Medical Decision Making Amount and/or Complexity of Data Reviewed Labs: ordered. Decision-making details documented in ED Course. Radiology: ordered. Decision-making details documented in ED Course. ECG/medicine tests:  Decision-making details documented in ED Course.   Patient is a handoff from [place], [place]. Please see her note for full HPI and physical exam findings. Plan is for repeat troponin as patient is medium risk heart score. If repeat troponin is negative, can discharge home with PCP follow up.   Delta troponin <2. Patient discharged home with previously discussed plan. NO other focal concerns at this time requiring further evaluation.       Smitty Knudsen, PA-C 11/04/23 2012    Coral Spikes, DO 11/04/23 2251

## 2023-11-04 NOTE — ED Provider Notes (Signed)
[place] [facility] Provider Note   CSN: [id number] Arrival date & time: 11/04/23  1510     History  Chief Complaint  Patient presents with   Chest Pain    Breanna Casey is a 67 y.o. female left-sided chest and shoulder pain.  Patient reports that this pain began 2 days ago.  She describes it as sharp, intermittent, fleeting, left shoulder blade region and anterior left wall region.  She states that the pain has worsened and she wanted make sure she was not having a heart attack because her mother has had 1.  Blood pressure is elevated here on arrival.  She denies any shortness of breath, hemoptysis, lightheadedness.  She has not tried anything the pain but it is extremely uncomfortable especially when lying on that side at night.   Chest Pain      Home Medications Prior to Admission medications   Medication Sig Start Date End Date Taking? Authorizing Provider  acetic acid 2 % otic solution Place 4 drops into the right ear 3 (three) times daily. X 5-7 days 06/05/23   Agapito Games, MD  albuterol (VENTOLIN HFA) 108 (90 Base) MCG/ACT inhaler Inhale 2 puffs into the lungs every 6 (six) hours as needed for wheezing or shortness of breath. 09/05/23   Agapito Games, MD  Barberry-Oreg Grape-Goldenseal 200-200-50 MG CAPS  12/06/15   [provider]  CITRUS BERGAMOT PO  07/05/20   [provider]  Coenzyme Q10 (CO Q 10 PO) Take by mouth.    [provider]  DIGESTIVE ENZYMES PO Take by mouth.    [provider]  FEVERFEW PO Take by mouth.    [provider]  fexofenadine (ALLEGRA) 180 MG tablet  02/03/19   [provider]  fish oil-omega-3 fatty acids 1000 MG capsule Take 2 g by mouth daily.    [provider]  fluticasone (FLOVENT HFA) 110 MCG/ACT inhaler Inhale 2 puffs into the lungs 2 (two) times daily. in the morning and at bedtime. 10/26/23   Agapito Games, MD   INOSITOL PO  09/04/18   [provider]  magnesium chloride 200 MG/ML SOLN  12/06/15   [provider]  Melatonin-Pyridoxine (MELATIN) 3-1 MG TABS  07/05/20   [provider]  Menaquinone-7 (VITAMIN K2 PO) Take 200 mg by mouth daily.    [provider]  Olive Leaf Extract 250 MG CAPS  02/17/20   [provider]  traMADol (ULTRAM) 50 MG tablet Take 50 mg by mouth 2 (two) times daily as needed.    [provider]      Allergies    Aspirin, Butalbital-asa-caff-codeine, Butalbital-aspirin-caffeine, Cefdinir, Cimetidine, Ciprofloxacin, Clarithromycin, Codeine sulfate, Doxycycline, Erythromycin, Fiorinal-codeine #3  [butalbital-asa-caff-codeine], Flagyl [metronidazole], Glycopyrrolate, Hydrochlorothiazide, Hydrochlorothiazide w-triamterene, Hydrocodone-acetaminophen, Ibuprofen, Indomethacin, Iodinated contrast media, Ketorolac tromethamine, Latex, Meperidine and related, Meperidine hcl, Naproxen, Pb-hyoscy-atropine-scopolamine, Penicillins, Propoxyphene hcl, Propoxyphene n-acetaminophen, Sulfa antibiotics, Sulfonamide derivatives, Tessalon [benzonatate], Tetracycline, Triamterene, Tromethamine, Propantheline, Soy allergy, Sucralfate, Alcohol, Beef (bovine) protein, Butorphanol tartrate, Egg-derived products, Nexium [esomeprazole magnesium], Pentazocine lactate, and Milk protein    Review of Systems   Review of Systems  Cardiovascular:  Positive for chest pain.    Physical Exam Updated Vital Signs BP (!) 158/77   Pulse 71   Temp 98.7 F (37.1 C) (Oral)   Resp 14   Ht 5\' 4"  (1.626 m)   Wt 61.7 kg   SpO2 100%   BMI 23.34 kg/m  Physical Exam Vitals  and nursing note reviewed.  Constitutional:      General: She is not in acute distress.    Appearance: She is well-developed. She is not diaphoretic.  HENT:     Head: Normocephalic and atraumatic.     Right Ear: External ear normal.     Left Ear: External ear normal.     Nose: Nose normal.      Mouth/Throat:     Mouth: Mucous membranes are moist.  Eyes:     General: No scleral icterus.    Conjunctiva/sclera: Conjunctivae normal.  Cardiovascular:     Rate and Rhythm: Normal rate and regular rhythm.     Heart sounds: Normal heart sounds. No murmur heard.    No friction rub. No gallop.  Pulmonary:     Effort: Pulmonary effort is normal. No respiratory distress.     Breath sounds: Normal breath sounds.  Chest:     Chest wall: Tenderness present.    Abdominal:     General: Bowel sounds are normal. There is no distension.     Palpations: Abdomen is soft. There is no mass.     Tenderness: There is no abdominal tenderness. There is no guarding.  Musculoskeletal:     Cervical back: Normal range of motion.       Back:     Comments: Noted kyphosis of the upper thoracic region, tenderness between the medial border of the left scapula and spine and the rhomboid and trapezius region.  This reproduces pain of complaint.  Skin:    General: Skin is warm and dry.  Neurological:     Mental Status: She is alert and oriented to person, place, and time.  Psychiatric:        Behavior: Behavior normal.     ED Results / Procedures / Treatments   Labs (all labs ordered are listed, but only abnormal results are displayed) Labs Reviewed  I-STAT CHEM 8, ED - Abnormal; Notable for the following components:      Result Value   BUN 5 (*)    All other components within normal limits  CBC  TROPONIN I (HIGH SENSITIVITY)  TROPONIN I (HIGH SENSITIVITY)    EKG EKG Interpretation Date/Time:  Saturday November 04 2023 15:20:49 EST Ventricular Rate:  79 PR Interval:  108 QRS Duration:  80 QT Interval:  380 QTC Calculation: 435 R Axis:   33  Text Interpretation: Sinus rhythm with short PR Septal infarct , age undetermined Abnormal ECG When compared with ECG of 30-Jan-2023 12:43, PREVIOUS ECG IS PRESENT Confirmed by Estanislado Pandy [phone]) on 11/04/2023 5:57:03 PM  Radiology DG Chest 2  View  Result Date: 11/04/2023 CLINICAL DATA:  Chest pain. EXAM: CHEST - 2 VIEW COMPARISON:  January 30, 2023. FINDINGS: The heart size and mediastinal contours are within normal limits. Both lungs are clear. The visualized skeletal structures are unremarkable. IMPRESSION: No active cardiopulmonary disease. Electronically Signed   By: Lupita Raider M.D.   On: 11/04/2023 15:53    Procedures Procedures    Medications Ordered in ED Medications - No data to display  ED Course/ Medical Decision Making/ A&P Clinical Course as of 11/04/23 1906  Sat Nov 04, 2023  1755 BP(!): 216/87 [AH]  1755 BP(!): 158/77 [AH]  1755 I-stat chem 8, ED (not at [facility], [facility] or [place])(!) [AH]  1755 Troponin I (High Sensitivity): <2 [AH]  1755 CBC [AH]  1755 DG Chest 2 View [AH]  1755 I personally visualized and interpreted the images using our  PACS system. Acute findings include:  none  [AH]  1755 ED EKG [AH]  1756 EKG shows NSR rate of 79- nonischemic  [AH]  1833 Troponin I (High Sensitivity) [AH]    Clinical Course User Index [AH] Arthor Captain, PA-C             HEART Score: 4                    Medical Decision Making Given the large differential diagnosis for [facility], the decision making in this case is of high complexity.  After evaluating all of the data points in this case, the presentation of Denisha Bein is NOT consistent with Acute Coronary Syndrome (ACS) and/or myocardial ischemia, pulmonary embolism, aortic dissection; Borhaave's, significant arrythmia, pneumothorax, cardiac tamponade, or other emergent cardiopulmonary condition.  Further, the presentation of Flower Aburto is NOT consistent with pericarditis, myocarditis, cholecystitis, pancreatitis, mediastinitis, endocarditis, new valvular disease.  Additionally, the presentation of [facility] NOT consistent with flail chest, cardiac contusion, ARDS, or significant intra-thoracic or intra-abdominal  bleeding.  Moreover, this presentation is NOT consistent with pneumonia, sepsis, or pyelonephritis.  The patient has a HEART Score: 4    Patient will NEED 2nd troponin however strong suspicion for MSK pain. Sign our to PA Zelaya at shift handoff.   Amount and/or Complexity of Data Reviewed Labs: ordered. Decision-making details documented in ED Course. Radiology: ordered. Decision-making details documented in ED Course. ECG/medicine tests: ordered and independent interpretation performed. Decision-making details documented in ED Course.          Final Clinical Impression(s) / ED Diagnoses Final diagnoses:  Chest wall pain    Rx / DC Orders ED Discharge Orders     None         Arthor Captain, PA-C 11/04/23 1906    Coral Spikes, DO 11/04/23 2251

## 2023-11-04 NOTE — ED Triage Notes (Signed)
Pt reports sharp left sided chest pain that started today but originated in her left shoulder and back on Thursday. Denies nv/d and shortness of breath. She does report diarrhea today.

## 2023-11-04 NOTE — Discharge Instructions (Signed)
You have been diagnosed by your caregiver as having chest wall pain. °SEEK IMMEDIATE MEDICAL ATTENTION IF: °You develop a fever.  °Your chest pains become severe or intolerable.  °You develop new, unexplained symptoms (problems).  °You develop shortness of breath, nausea, vomiting, sweating or feel light headed.  °You develop a new cough or you cough up blood. ° °

## 2023-11-04 NOTE — ED Notes (Signed)
IV attempt x 1 unsuccessful by this RN 

## 2023-11-10 ENCOUNTER — Encounter: Payer: Self-pay | Admitting: Sports Medicine

## 2023-11-10 ENCOUNTER — Ambulatory Visit (INDEPENDENT_AMBULATORY_CARE_PROVIDER_SITE_OTHER): Payer: Medicare HMO | Admitting: Sports Medicine

## 2023-11-10 ENCOUNTER — Other Ambulatory Visit (INDEPENDENT_AMBULATORY_CARE_PROVIDER_SITE_OTHER): Payer: Medicare HMO

## 2023-11-10 DIAGNOSIS — M7542 Impingement syndrome of left shoulder: Secondary | ICD-10-CM

## 2023-11-10 DIAGNOSIS — S46012S Strain of muscle(s) and tendon(s) of the rotator cuff of left shoulder, sequela: Secondary | ICD-10-CM | POA: Diagnosis not present

## 2023-11-10 DIAGNOSIS — M545 Low back pain, unspecified: Secondary | ICD-10-CM

## 2023-11-10 DIAGNOSIS — M542 Cervicalgia: Secondary | ICD-10-CM

## 2023-11-10 DIAGNOSIS — G8929 Other chronic pain: Secondary | ICD-10-CM | POA: Diagnosis not present

## 2023-11-10 NOTE — Assessment & Plan Note (Signed)
I have suggested cervical spine imaging including MRI to rule out disc, nerve, or facet pathology, Armanie does decline any additional imaging, she is aware of the risks, benefits and alternatives. She does request a left-sided paracervical trigger point injection which we performed today. Return to see me as needed.

## 2023-11-10 NOTE — Assessment & Plan Note (Addendum)
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 Her mother did unfortunately passed away and she is now able to get back into care for herself. Today I did notice a near full thickness articular sided tear of the supraspinatus, we did an injection given per her request, she tells me she would never want surgery.

## 2023-11-10 NOTE — Assessment & Plan Note (Signed)
Chronic left-sided axial low back pain, she had a trigger point injection back in April 2024, she did really well. I have suggested MRI to further delineate disc, nerve, or facet pathology, as well as rule out any other ominous or pathologic findings, she declines. Today we did a left-sided paralumbar trigger point injection per her request.

## 2023-11-10 NOTE — Progress Notes (Signed)
    Procedures performed today:    Procedure: Real-time Ultrasound Guided injection of the left subacromial bursa Device: Samsung HS60  Verbal informed consent obtained.  Time-out conducted.  Noted no overlying erythema, induration, or other signs of local infection.  Skin prepped in a sterile fashion.  Local anesthesia: Topical Ethyl chloride.  With sterile technique and under real time ultrasound guidance: Noted some full-thickness rotator cuff tearing, 1 cc Kenalog 40, 1 cc lidocaine, 1 cc bupivacaine injected easily Completed without difficulty  Advised to call if fevers/chills, erythema, induration, drainage, or persistent bleeding.  Images permanently stored and available for review in PACS.  Impression: Technically successful ultrasound guided injection.   Procedure:  Injection of left paralumbar trigger point Consent obtained and verified. Time-out conducted. Noted no overlying erythema, induration, or other signs of local infection. Skin prepped in a sterile fashion. Topical analgesic spray: Ethyl chloride. Completed without difficulty. Meds: 1 cc lidocaine, 1 cc kenalog 40 injected easily into the paralumbar muscle Advised to call if fevers/chills, erythema, induration, drainage, or persistent bleeding.  Procedure:  Injection of left paracervical trigger point Consent obtained and verified. Time-out conducted. Noted no overlying erythema, induration, or other signs of local infection. Skin prepped in a sterile fashion. Topical analgesic spray: Ethyl chloride. Completed without difficulty. Meds: 1 cc lidocaine, 1 cc kenalog 40 injected easily into the paralumbar muscle Advised to call if fevers/chills, erythema, induration, drainage, or persistent bleeding.  Independent interpretation of notes and tests performed by another provider:   None.  Brief History, Exam, Impression, and Recommendations:    Rotator cuff tear, left Breanna Casey has known left shoulder impingement  syndrome, she had a subacromial injection in March 2023 with good relief, she did another 1 in April 2024, afterwards she was caring for her mother, and fell and had immediate recurrence of pain. Her mother did unfortunately passed away and she is now able to get back into care for herself. Today I did notice a near full thickness articular sided tear of the supraspinatus, we did an injection given per her request, she tells me she would never want surgery.  Chronic low back pain without sciatica Chronic left-sided axial low back pain, she had a trigger point injection back in April 2024, she did really well. I have suggested MRI to further delineate disc, nerve, or facet pathology, as well as rule out any other ominous or pathologic findings, she declines. Today we did a left-sided paralumbar trigger point injection per her request.  Cervicalgia I have suggested cervical spine imaging including MRI to rule out disc, nerve, or facet pathology, Agam does decline any additional imaging, she is aware of the risks, benefits and alternatives. She does request a left-sided paracervical trigger point injection which we performed today. Return to see me as needed.    ____________________________________________ Breanna Casey. Breanna Casey, M.D., ABFM., CAQSM., AME. Primary Care and Sports Medicine [facility]  Adjunct Professor of Family Medicine  [employer]  Restaurant manager, fast food

## 2023-11-13 DIAGNOSIS — M7542 Impingement syndrome of left shoulder: Secondary | ICD-10-CM | POA: Diagnosis not present

## 2023-11-13 MED ORDER — TRIAMCINOLONE ACETONIDE 40 MG/ML IJ SUSP
40.0000 mg | Freq: Once | INTRAMUSCULAR | Status: AC
  Administered 2023-11-13: 40 mg via INTRAMUSCULAR

## 2023-11-13 NOTE — Addendum Note (Signed)
Addended by: Carren Rang A on: 11/13/2023 02:28 PM   Modules accepted: Orders

## 2023-11-19 ENCOUNTER — Encounter: Payer: Self-pay | Admitting: Family Medicine

## 2023-12-03 ENCOUNTER — Encounter: Payer: Self-pay | Admitting: Sports Medicine

## 2023-12-12 ENCOUNTER — Other Ambulatory Visit (HOSPITAL_COMMUNITY): Payer: Self-pay

## 2024-01-02 ENCOUNTER — Encounter: Payer: Self-pay | Admitting: Family Medicine

## 2024-01-02 DIAGNOSIS — J4520 Mild intermittent asthma, uncomplicated: Secondary | ICD-10-CM

## 2024-01-05 MED ORDER — FLOVENT HFA 110 MCG/ACT IN AERO: 2.0000 | INHALATION_SPRAY | Freq: Two times a day (BID) | RESPIRATORY_TRACT | 3 refills | Status: DC

## 2024-01-05 MED ORDER — FLUTICASONE PROPIONATE HFA 110 MCG/ACT IN AERO: 2.0000 | INHALATION_SPRAY | Freq: Two times a day (BID) | RESPIRATORY_TRACT | 5 refills | Status: DC

## 2024-01-05 NOTE — Telephone Encounter (Signed)
Meds ordered this encounter  Medications   DISCONTD: fluticasone (FLOVENT HFA) 110 MCG/ACT inhaler    Sig: Inhale 2 puffs into the lungs 2 (two) times daily. in the morning and at bedtime.    Dispense:  12 each    Refill:  5   FLOVENT HFA 110 MCG/ACT inhaler    Sig: Inhale 2 puffs into the lungs 2 (two) times daily. in the morning and at bedtime.    Dispense:  36 each    Refill:  3

## 2024-01-08 NOTE — Telephone Encounter (Signed)
There is not Flovent 125.    Flovent only comes in 44, 110 and 220 for the Metered dose in haler.  For the diskus is come s in 50, 100, and 250.

## 2024-01-09 ENCOUNTER — Other Ambulatory Visit (HOSPITAL_COMMUNITY)
Admission: RE | Admit: 2024-01-09 | Discharge: 2024-01-09 | Disposition: A | Discharge status: Home / Self Care | Payer: Self-pay | Source: Ambulatory Visit | Attending: Medical Genetics | Admitting: Medical Genetics

## 2024-01-09 DIAGNOSIS — Z006 Encounter for examination for normal comparison and control in clinical research program: Secondary | ICD-10-CM | POA: Insufficient documentation

## 2024-01-10 MED ORDER — AMBULATORY NON FORMULARY MEDICATION: 3 refills | Status: DC

## 2024-01-10 NOTE — Telephone Encounter (Signed)
 Faxed script to [place] prescriptions plus [phone]

## 2024-01-10 NOTE — Addendum Note (Signed)
 Addended by: Alma Muegge D on: 01/10/2024 02:16 PM   Modules accepted: Orders

## 2024-01-10 NOTE — Telephone Encounter (Signed)
 Does she need a new prescription? Or will they automatically convert to the 125?   I put in a new one as a nonambulatory since that medication does not come up in our system.  But we can send it as a 9 AM.  Also I do not think she would be able to use 2 different mail order as I would suspect that it would trigger in their system that had already been filled.  I am pretty sure they have a very similar system to hours.  Meds ordered this encounter  Medications   DISCONTD: fluticasone  (FLOVENT  HFA) 110 MCG/ACT inhaler    Sig: Inhale 2 puffs into the lungs 2 (two) times daily. in the morning and at bedtime.    Dispense:  12 each    Refill:  5   FLOVENT  HFA 110 MCG/ACT inhaler    Sig: Inhale 2 puffs into the lungs 2 (two) times daily. in the morning and at bedtime.    Dispense:  36 each    Refill:  3   AMBULATORY NON FORMULARY MEDICATION    Sig: Medication Name: flixotide HFA 125mcg - 2 puff inhaled BID    Dispense:  3 Inhaler    Refill:  3

## 2024-01-17 ENCOUNTER — Other Ambulatory Visit: Payer: Self-pay | Admitting: Family Medicine

## 2024-01-17 ENCOUNTER — Encounter: Payer: Self-pay | Admitting: Family Medicine

## 2024-01-17 DIAGNOSIS — J4520 Mild intermittent asthma, uncomplicated: Secondary | ICD-10-CM

## 2024-01-17 MED ORDER — AMBULATORY NON FORMULARY MEDICATION: 3 refills | Status: AC

## 2024-01-23 LAB — GENECONNECT MOLECULAR SCREEN: Genetic Analysis Overall Interpretation: NEGATIVE

## 2024-02-02 NOTE — Telephone Encounter (Signed)
 Please let patient know that we received a request from her pharmacy in regards to the inhaler but I am pretty sure she had decided to get it online but I just wanted to make sure that we can disregard this request.

## 2024-02-06 ENCOUNTER — Telehealth: Payer: Self-pay

## 2024-02-06 NOTE — Telephone Encounter (Signed)
 Copied from CRM [id number]. Topic: General - Call Back - No Documentation >> Feb 06, 2024 12:47 PM Gaetano Hawthorne wrote: Reason for CRM: Patient returning Kim's phone call [facility] Agent was not able to connect with someone at the [facility] (most likely at lunch) - I was able to confirm that the patient is getting her inhaler online and that the clinic can disregard that request from the pharmacy.

## 2024-02-06 NOTE — Telephone Encounter (Signed)
Attempted call to patient. Left voice mail message requesting a return call.  

## 2024-02-08 ENCOUNTER — Encounter: Payer: Self-pay | Admitting: Family Medicine

## 2024-02-08 NOTE — Telephone Encounter (Signed)
 Never heard of it

## 2024-03-05 ENCOUNTER — Encounter: Payer: Self-pay | Admitting: Family Medicine

## 2024-03-05 ENCOUNTER — Ambulatory Visit (INDEPENDENT_AMBULATORY_CARE_PROVIDER_SITE_OTHER): Payer: Medicare HMO | Admitting: Family Medicine

## 2024-03-05 VITALS — BP 158/96 | HR 80 | Ht 64.0 in | Wt 143.2 lb

## 2024-03-05 DIAGNOSIS — T7840XS Allergy, unspecified, sequela: Secondary | ICD-10-CM

## 2024-03-05 DIAGNOSIS — B372 Candidiasis of skin and nail: Secondary | ICD-10-CM | POA: Diagnosis not present

## 2024-03-05 DIAGNOSIS — R030 Elevated blood-pressure reading, without diagnosis of hypertension: Secondary | ICD-10-CM

## 2024-03-05 DIAGNOSIS — E7800 Pure hypercholesterolemia, unspecified: Secondary | ICD-10-CM

## 2024-03-05 DIAGNOSIS — M797 Fibromyalgia: Secondary | ICD-10-CM | POA: Diagnosis not present

## 2024-03-05 DIAGNOSIS — G8929 Other chronic pain: Secondary | ICD-10-CM | POA: Diagnosis not present

## 2024-03-05 DIAGNOSIS — J4530 Mild persistent asthma, uncomplicated: Secondary | ICD-10-CM | POA: Diagnosis not present

## 2024-03-05 MED ORDER — PREDNISONE 20 MG PO TABS: ORAL_TABLET | ORAL | 0 refills | Status: DC

## 2024-03-05 MED ORDER — FLUCONAZOLE 150 MG PO TABS: 150.0000 mg | ORAL_TABLET | Freq: Once | ORAL | 0 refills | Status: AC

## 2024-03-05 MED ORDER — TRAMADOL HCL 50 MG PO TABS: 50.0000 mg | ORAL_TABLET | Freq: Two times a day (BID) | ORAL | 2 refills | Status: DC | PRN

## 2024-03-05 NOTE — Patient Instructions (Addendum)
 Check your BP at home for the next couple of days.

## 2024-03-05 NOTE — Assessment & Plan Note (Signed)
Due to recheck lipoids.

## 2024-03-05 NOTE — Assessment & Plan Note (Signed)
 We did do blood pressure manually here today and it is elevated she does have some higher blood pressures on her machine.  We discussed some parameters for when to recheck and when it might be more of an emergency situation specifically if it is greater than 180 and she is feeling symptomatic.  I do want her to monitor for trend I am wondering if she might actually be starting to develop high blood pressure.

## 2024-03-05 NOTE — Assessment & Plan Note (Signed)
 She has been able to order her inhalers from [place] so right now she is up-to-date on her prescription and has adequate albuterol.

## 2024-03-05 NOTE — Assessment & Plan Note (Signed)
 Indication for chronic opioid: Fibromyalgia  Medication and dose: Tramadol 50 mg BID PRN # pills per month: 30, uses rarely. Last filled in March.   Last UDS date: 02/23/2022 Opioid Treatment Agreement signed (Y/N): Y 02/24/23 Opioid Treatment Agreement last reviewed with patient:  11/21/22 NCCSRS reviewed this encounter (include red flags):  Y F/U : 6 months.

## 2024-03-05 NOTE — Progress Notes (Signed)
 Established Patient Office Visit  Subjective  Patient ID: Breanna Casey, female    DOB: 10/23/1956  Age: 68 y.o. MRN: [id number]  Chief Complaint  Patient presents with   fibromyalgia follow up     HPI F/U fibromyalgia -she tried really tapering off the tramadol this past fall but says that she really was unsuccessful and feels like she really does need the medication.  She is taking it twice a day.  Pain under her chin for 4 days. It has been tender and very painful but it is some better the last 2 days.  No pressure injury or trauma.  In fact she just saw the dentist 2 weeks ago and everything checked out normally.    ROS    Objective:     BP (!) 158/96   Pulse 80   Ht 5\' 4"  (1.626 m)   Wt 143 lb 4 oz (65 kg)   SpO2 100%   BMI 24.59 kg/m    Physical Exam Vitals and nursing note reviewed.  Constitutional:      Appearance: Normal appearance.  HENT:     Head: Normocephalic and atraumatic.  Eyes:     Conjunctiva/sclera: Conjunctivae normal.  Neck:     Comments: No palpable lesion on the right jaw line.  Cardiovascular:     Rate and Rhythm: Normal rate and regular rhythm.  Pulmonary:     Effort: Pulmonary effort is normal.     Breath sounds: Normal breath sounds.  Skin:    General: Skin is warm and dry.  Neurological:     Mental Status: She is alert.  Psychiatric:        Mood and Affect: Mood normal.      No results found for any visits on 03/05/24.    The 10-year ASCVD risk score (Arnett DK, et al., 2019) is: 10.2%    Assessment & Plan:   Problem List Items Addressed This Visit       Cardiovascular and Mediastinum   White coat syndrome without diagnosis of hypertension   We did do blood pressure manually here today and it is elevated she does have some higher blood pressures on her machine.  We discussed some parameters for when to recheck and when it might be more of an emergency situation specifically if it is greater than 180 and she is  feeling symptomatic.  I do want her to monitor for trend I am wondering if she might actually be starting to develop high blood pressure.      Relevant Orders   Homocysteine   CMP14+EGFR   Lipid panel     Respiratory   Asthma, mild persistent   She has been able to order her inhalers from [place] so right now she is up-to-date on her prescription and has adequate albuterol.      Relevant Medications   predniSONE (DELTASONE) 20 MG tablet     Other   Hyperlipidemia   Due to recheck lipoids.       Relevant Orders   Homocysteine   CMP14+EGFR   Lipid panel   Fibromyalgia - Primary   Will restart tramadol.        Relevant Medications   traMADol (ULTRAM) 50 MG tablet   predniSONE (DELTASONE) 20 MG tablet   Other Relevant Orders   Homocysteine   CMP14+EGFR   Lipid panel   Encounter for chronic pain management   Indication for chronic opioid: Fibromyalgia  Medication and dose: Tramadol 50 mg BID PRN #  pills per month: 30, uses rarely. Last filled in March.   Last UDS date: 02/23/2022 Opioid Treatment Agreement signed (Y/N): Y 02/24/23 Opioid Treatment Agreement last reviewed with patient:  11/21/22 NCCSRS reviewed this encounter (include red flags):  Y F/U : 6 months.         Relevant Medications   traMADol (ULTRAM) 50 MG tablet   Other Visit Diagnoses       Skin candidiasis       Relevant Medications   fluconazole (DIFLUCAN) 150 MG tablet     Allergic reaction, sequela       Relevant Medications   predniSONE (DELTASONE) 20 MG tablet       Return in about 3 months (around 06/04/2024) for Get up to date pain contract, etc. .   I spent 40 minutes on the day of the encounter to include pre-visit record review, face-to-face time with the patient and post visit ordering of test.   Nani Gasser, MD

## 2024-03-05 NOTE — Assessment & Plan Note (Signed)
 Will restart tramadol.

## 2024-03-06 DIAGNOSIS — R030 Elevated blood-pressure reading, without diagnosis of hypertension: Secondary | ICD-10-CM | POA: Diagnosis not present

## 2024-03-06 DIAGNOSIS — M797 Fibromyalgia: Secondary | ICD-10-CM | POA: Diagnosis not present

## 2024-03-06 DIAGNOSIS — E7800 Pure hypercholesterolemia, unspecified: Secondary | ICD-10-CM | POA: Diagnosis not present

## 2024-03-06 NOTE — Telephone Encounter (Signed)
 Attempted to contact the patient. Call went straight to vm. Left a msg for patient to return a call back to the clinic. Direct call back info provided.

## 2024-03-08 ENCOUNTER — Encounter: Payer: Self-pay | Admitting: Family Medicine

## 2024-03-08 LAB — LIPID PANEL
Chol/HDL Ratio: 3.1 ratio (ref 0.0–4.4)
Cholesterol, Total: 273 mg/dL — ABNORMAL HIGH (ref 100–199)
HDL: 88 mg/dL (ref >39)
LDL Chol Calc (NIH): 170 mg/dL — ABNORMAL HIGH (ref 0–99)
Triglycerides: 93 mg/dL (ref 0–149)
VLDL Cholesterol Cal: 15 mg/dL (ref 5–40)

## 2024-03-08 LAB — CMP14+EGFR
ALT: 21 IU/L (ref 0–32)
AST: 26 IU/L (ref 0–40)
Albumin: 4.7 g/dL (ref 3.9–4.9)
Alkaline Phosphatase: 83 IU/L (ref 44–121)
BUN/Creatinine Ratio: 6 — ABNORMAL LOW (ref 12–28)
BUN: 4 mg/dL — ABNORMAL LOW (ref 8–27)
Bilirubin Total: 0.3 mg/dL (ref 0.0–1.2)
CO2: 23 mmol/L (ref 20–29)
Calcium: 9.6 mg/dL (ref 8.7–10.3)
Chloride: 102 mmol/L (ref 96–106)
Creatinine, Ser: 0.65 mg/dL (ref 0.57–1.00)
Globulin, Total: 2.4 g/dL (ref 1.5–4.5)
Glucose: 77 mg/dL (ref 70–99)
Potassium: 4.3 mmol/L (ref 3.5–5.2)
Sodium: 141 mmol/L (ref 134–144)
Total Protein: 7.1 g/dL (ref 6.0–8.5)
eGFR: 96 mL/min/{1.73_m2} (ref >59)

## 2024-03-08 LAB — HOMOCYSTEINE: Homocysteine: 6.7 umol/L (ref 0.0–17.2)

## 2024-03-08 NOTE — Progress Notes (Signed)
 Hi Breanna Casey, cardiac risk is at 10% for risk of having heart attack or stroke in the next 10 years.  Based on guidelines we would recommend a statin to lower your risk.   The 10-year ASCVD risk score (Arnett DK, et al., 2019) is: 10%   Values used to calculate the score:     Age: 68 years     Sex: Female     Is Non-Hispanic African American: No     Diabetic: No     Tobacco smoker: No     Systolic Blood Pressure: 158 mmHg     Is BP treated: No     HDL Cholesterol: 88 mg/dL     Total Cholesterol: 273 mg/dL

## 2024-05-21 ENCOUNTER — Encounter: Payer: Self-pay | Admitting: Family Medicine

## 2024-05-21 ENCOUNTER — Ambulatory Visit (INDEPENDENT_AMBULATORY_CARE_PROVIDER_SITE_OTHER): Admitting: Family Medicine

## 2024-05-21 VITALS — BP 132/68 | HR 93 | Temp 98.8°F | Ht 64.0 in | Wt 136.2 lb

## 2024-05-21 DIAGNOSIS — J340 Abscess, furuncle and carbuncle of nose: Secondary | ICD-10-CM | POA: Diagnosis not present

## 2024-05-21 DIAGNOSIS — J029 Acute pharyngitis, unspecified: Secondary | ICD-10-CM | POA: Diagnosis not present

## 2024-05-21 MED ORDER — LEVOFLOXACIN 500 MG PO TABS: 500.0000 mg | ORAL_TABLET | Freq: Every day | ORAL | 0 refills | Status: AC

## 2024-05-21 MED ORDER — FLUCONAZOLE 150 MG PO TABS: 150.0000 mg | ORAL_TABLET | Freq: Once | ORAL | 1 refills | Status: AC

## 2024-05-21 MED ORDER — MUPIROCIN 2 % EX OINT: TOPICAL_OINTMENT | CUTANEOUS | 0 refills | Status: DC

## 2024-05-21 NOTE — Progress Notes (Signed)
 Acute Office Visit  Subjective:     Patient ID: Breanna Casey, female    DOB: September 30, 1956, 68 y.o.   MRN: [id number]  Chief Complaint  Patient presents with   Sore Throat    Pt states that her throat has been bothering her x 3 days. She does c/o  some nasal drainage and ear pain. Mostly on the L side. I checked her L ear and it was clear. However, when I checked her R ear it was red.   She has been using warm salt water for her throat and hydrogen peroxide for her L nostril that has also been bothering her.   She denies any sick contacts f/s/c.     Diarrhea    X2 days. She thinks that it may have come from some [place] that she ate. She reports that yesterday the stools were watery and today her stool was more soft.     HPI Patient is in today for   Pt states that her throat has been bothering her x 4-5  days. She does c/o  some nasal drainage and  left ear pain. She has hd some swollen gland on the right side of her neck   Her left ear will sound like it is a cat purring when she lays on that side.  She has been using warm salt water for her throat and hydrogen peroxide for her L nostril that has also been bothering her. She denies any sick contacts f/s/c.    Diarrhea -   X2 days. She thinks that it may have come from some [place] that she ate. She reports that yesterday the stools were watery and today her stool was more soft.      ROS      Objective:    BP 132/68   Pulse 93   Temp 98.8 F (37.1 C) (Oral)   Ht 5' 4 (1.626 m)   Wt 136 lb 3.2 oz (61.8 kg)   SpO2 100%   BMI 23.38 kg/m    Physical Exam Constitutional:      Appearance: Normal appearance.  HENT:     Head: Normocephalic and atraumatic.     Right Ear: Ear canal and external ear normal. There is no impacted cerumen.     Left Ear: Tympanic membrane, ear canal and external ear normal. There is no impacted cerumen.     Ears:     Comments: Right TM with distorted light reflex.      Nose: Nose  normal.     Comments: Left nostril is erythematous externally and mildly swollen.      Mouth/Throat:     Pharynx: Oropharynx is clear.   Eyes:     Conjunctiva/sclera: Conjunctivae normal.    Cardiovascular:     Rate and Rhythm: Normal rate and regular rhythm.  Pulmonary:     Effort: Pulmonary effort is normal.     Breath sounds: Normal breath sounds.   Musculoskeletal:     Cervical back: Neck supple. No tenderness.  Lymphadenopathy:     Cervical: No cervical adenopathy.   Skin:    General: Skin is warm and dry.   Neurological:     Mental Status: She is alert and oriented to person, place, and time.   Psychiatric:        Mood and Affect: Mood normal.     No results found for any visits on 05/21/24.      Assessment & Plan:   Problem List Items Addressed  This Visit   None Visit Diagnoses       Nasal ulcer    -  Primary     Sore throat       Relevant Orders   Culture, Group A Strep      Nasal ulcer - turning into cellulitis of the nose - will tx with FQ based on allergies and topical mupiricin.     Sore throat-strep culture sent.  Just some mild irritation but no significant erythema I am not sure if this is related to the nose or something separate the nasal ulcer has been there for most 2 months but has gradually gotten worse and the sore throat is just been there for a few days.  Also consider that she may have a viral element to this.  Call if not better or getting worse.  Meds ordered this encounter  Medications   fluconazole  (DIFLUCAN ) 150 MG tablet    Sig: Take 1 tablet (150 mg total) by mouth once for 1 dose.    Dispense:  1 tablet    Refill:  1   mupirocin ointment (BACTROBAN) 2 %    Sig: Apply to inside of each nares daily for 10 days then twice a week for maintenance.    Dispense:  30 g    Refill:  0   levofloxacin  (LEVAQUIN ) 500 MG tablet    Sig: Take 1 tablet (500 mg total) by mouth daily for 7 days.    Dispense:  7 tablet    Refill:  0     Return if symptoms worsen or fail to improve.  Duaine German, MD

## 2024-05-24 ENCOUNTER — Ambulatory Visit: Payer: Self-pay | Admitting: Family Medicine

## 2024-05-24 LAB — CULTURE, GROUP A STREP: Strep A Culture: NEGATIVE

## 2024-05-24 NOTE — Progress Notes (Signed)
 Strep culture is negative.  I hope you are starting to feel better.

## 2024-06-14 ENCOUNTER — Ambulatory Visit: Admitting: Family Medicine

## 2024-06-18 DIAGNOSIS — H524 Presbyopia: Secondary | ICD-10-CM | POA: Diagnosis not present

## 2024-06-21 ENCOUNTER — Ambulatory Visit: Admitting: Family Medicine

## 2024-06-25 ENCOUNTER — Ambulatory Visit (INDEPENDENT_AMBULATORY_CARE_PROVIDER_SITE_OTHER): Admitting: Family Medicine

## 2024-06-25 ENCOUNTER — Encounter: Payer: Self-pay | Admitting: Family Medicine

## 2024-06-25 VITALS — BP 126/70 | HR 81 | Ht 64.0 in | Wt 134.2 lb

## 2024-06-25 DIAGNOSIS — G8929 Other chronic pain: Secondary | ICD-10-CM | POA: Diagnosis not present

## 2024-06-25 DIAGNOSIS — M25552 Pain in left hip: Secondary | ICD-10-CM | POA: Diagnosis not present

## 2024-06-25 DIAGNOSIS — M797 Fibromyalgia: Secondary | ICD-10-CM

## 2024-06-25 DIAGNOSIS — S46012S Strain of muscle(s) and tendon(s) of the rotator cuff of left shoulder, sequela: Secondary | ICD-10-CM

## 2024-06-25 MED ORDER — TRAMADOL HCL 50 MG PO TABS: 50.0000 mg | ORAL_TABLET | Freq: Three times a day (TID) | ORAL | 2 refills | Status: DC | PRN

## 2024-06-25 NOTE — Assessment & Plan Note (Signed)
 Update her pain contract today as well as up-to-date urine drug screen for 90 tabs on her tramadol .  I do still would really like to get her further treatment evaluation for her shoulder and hip and buttock area which could also be coming from her back.  If we could find a physical therapist who is familiar and worth working with patients for fibromyalgia that would be wonderful.

## 2024-06-25 NOTE — Progress Notes (Signed)
 Established Patient Office Visit  Subjective  Patient ID: Breanna Casey, female    DOB: 15-Feb-1956  Age: 68 y.o. MRN: [id number]  No chief complaint on file.   HPI  She is here to follow-up for chronic pain management for her fibromyalgia.  More recently though she has really been struggling with the left side of her body in particular her left shoulder, left hip and pain into that left buttock.  Was previously working with a great physical therapist at [facility] named Odella who really did a great job in working with her and working with her fibromyalgia but unfortunately she moved away to [place]  she did try a new person there and did not find them helpful.  Her chiropractor that she was seeing for years retired and she did try different chiropractor but just ended up with more pain than actual relief.  She also recently saw our sports med doc who diagnosed her with impingement syndrome of her left shoulder.  She had had a subacromial injection in March 2023 with relief and then another 1 in April 2020 for but then fell while caring for her mother.  She did have an ultrasound which showed possible full-thickness articular tear of the supraspinatus and they did do an injection again in December 2024.  He really wanted her to have an MRI but she is not interested in any additional imaging.  He had a bad experience with prior MRI.  He is not taking her tramadol  3 times a day she had tried really hard to back down to twice a day but more recently because of increased pain has been taking it 3 times a day.    ROS    Objective:     BP 126/70 (BP Location: Left Arm, Cuff Size: Normal)   Pulse 81   Ht 5' 4 (1.626 m)   Wt 134 lb 3.2 oz (60.9 kg)   SpO2 100%   BMI 23.04 kg/m    Physical Exam Vitals and nursing note reviewed.  Constitutional:      Appearance: Normal appearance.  HENT:     Head: Normocephalic and atraumatic.  Eyes:     Conjunctiva/sclera: Conjunctivae normal.   Cardiovascular:     Rate and Rhythm: Normal rate and regular rhythm.  Pulmonary:     Effort: Pulmonary effort is normal.     Breath sounds: Normal breath sounds.  Skin:    General: Skin is warm and dry.  Neurological:     Mental Status: She is alert.  Psychiatric:        Mood and Affect: Mood normal.      No results found for any visits on 06/25/24.    The 10-year ASCVD risk score (Arnett DK, et al., 2019) is: 6.6%    Assessment & Plan:   Problem List Items Addressed This Visit       Musculoskeletal and Integument   Rotator cuff tear, left   Relevant Orders   Ambulatory referral to Sports Medicine     Other   Fibromyalgia   Update her pain contract today as well as up-to-date urine drug screen for 90 tabs on her tramadol .  I do still would really like to get her further treatment evaluation for her shoulder and hip and buttock area which could also be coming from her back.  If we could find a physical therapist who is familiar and worth working with patients for fibromyalgia that would be wonderful.      Relevant Medications  traMADol  (ULTRAM ) 50 MG tablet   Other Relevant Orders   Drug Screen, Ur (12+Oxycodone +Crt)   Encounter for chronic pain management - Primary   Indication for chronic opioid: Fibromyalgia  Medication and dose: Tramadol  50 mg TID PRN # pills per month: 90.   Last UDS date: 06/25/24 Opioid Treatment Agreement signed (Y/N): Y 02/24/23 Opioid Treatment Agreement last reviewed with patient:  06/25/24 NCCSRS reviewed this encounter (include red flags):  Y F/U : 6 months      Relevant Medications   traMADol  (ULTRAM ) 50 MG tablet   Other Relevant Orders   Drug Screen, Ur (12+Oxycodone +Crt)   Other Visit Diagnoses       Left hip pain       Relevant Orders   Ambulatory referral to Sports Medicine       Return in about 3 months (around 09/25/2024) for Fibro and joint pain .    Dorothyann Byars, MD

## 2024-06-25 NOTE — Assessment & Plan Note (Signed)
 Indication for chronic opioid: Fibromyalgia  Medication and dose: Tramadol  50 mg TID PRN # pills per month: 90.   Last UDS date: 06/25/24 Opioid Treatment Agreement signed (Y/N): Y 02/24/23 Opioid Treatment Agreement last reviewed with patient:  06/25/24 NCCSRS reviewed this encounter (include red flags):  Y F/U : 6 months

## 2024-06-26 ENCOUNTER — Telehealth: Payer: Self-pay | Admitting: Family Medicine

## 2024-06-26 NOTE — Telephone Encounter (Signed)
 Please call patient and let her know that I did have some suggestions for PT.  1 would be aquatic therapy which we do offer at our [facility] location which can be great for patients with fibromyalgia.  Also [employer].  I have had great feedback from them in the past with patient so I think they could be a great option as well.

## 2024-06-27 NOTE — Telephone Encounter (Signed)
 Left a message for a return call.

## 2024-06-28 ENCOUNTER — Encounter: Payer: Self-pay | Admitting: Family Medicine

## 2024-06-28 DIAGNOSIS — S46012S Strain of muscle(s) and tendon(s) of the rotator cuff of left shoulder, sequela: Secondary | ICD-10-CM

## 2024-06-28 DIAGNOSIS — M797 Fibromyalgia: Secondary | ICD-10-CM

## 2024-06-28 DIAGNOSIS — M25552 Pain in left hip: Secondary | ICD-10-CM

## 2024-06-28 DIAGNOSIS — M7918 Myalgia, other site: Secondary | ICD-10-CM

## 2024-06-28 LAB — DRUG SCREEN, UR (12+OXYCODONE+CRT)
Amphetamine Scrn, Ur: NEGATIVE ng/mL
BARBITURATE SCREEN URINE: NEGATIVE ng/mL
BENZODIAZEPINE SCREEN, URINE: NEGATIVE ng/mL
CANNABINOIDS UR QL SCN: NEGATIVE ng/mL
Cocaine (Metab) Scrn, Ur: NEGATIVE ng/mL
Creatinine(Crt), U: 15.7 mg/dL — AB (ref 20.0–300.0)
Fentanyl, Urine: NEGATIVE pg/mL
Meperidine Screen, Urine: NEGATIVE ng/mL
Methadone Screen, Urine: NEGATIVE ng/mL
OXYCODONE+OXYMORPHONE UR QL SCN: NEGATIVE ng/mL
Opiate Scrn, Ur: NEGATIVE ng/mL
Ph of Urine: 6.7 (ref 4.5–8.9)
Phencyclidine Qn, Ur: NEGATIVE ng/mL
Propoxyphene Scrn, Ur: NEGATIVE ng/mL
SPECIFIC GRAVITY: 1.0035
Tramadol Screen, Urine: POSITIVE — AB

## 2024-06-28 LAB — SPECIMEN STATUS REPORT

## 2024-07-01 NOTE — Telephone Encounter (Signed)
 Sports med referral was already placed.  Did go ahead and place PT referral today.  As far as the tramadol  it should be positive if she is taking any.  Has nothing to do with the amount.

## 2024-07-01 NOTE — Telephone Encounter (Signed)
 Sent mychart message of info from Dr. Alvan since pt hadn't returned call.

## 2024-07-02 ENCOUNTER — Ambulatory Visit: Admitting: Sports Medicine

## 2024-07-02 ENCOUNTER — Encounter: Payer: Self-pay | Admitting: Sports Medicine

## 2024-07-02 VITALS — BP 140/80 | Ht 64.0 in | Wt 134.0 lb

## 2024-07-02 DIAGNOSIS — M25559 Pain in unspecified hip: Secondary | ICD-10-CM | POA: Diagnosis not present

## 2024-07-02 DIAGNOSIS — G8929 Other chronic pain: Secondary | ICD-10-CM | POA: Diagnosis not present

## 2024-07-02 DIAGNOSIS — M25512 Pain in left shoulder: Secondary | ICD-10-CM | POA: Diagnosis not present

## 2024-07-02 NOTE — Progress Notes (Signed)
   Subjective:    Patient ID: Breanna Casey, female    DOB: 04/10/1956, 68 y.o.   MRN: [id number]  HPI chief complaint: Left shoulder and left hip pain  Breanna Casey is a pleasant 68 year old female that presents today with a couple of different complaints.  She has chronic left shoulder pain.  She has been injected in the past by Dr. ONEIDA.  Each injection does provide good pain relief.  She has been diagnosed with a rotator cuff tear but does not want to pursue surgery.  She is also complaining of lateral left hip pain.  It is especially painful at night when sleeping on her side.  She has tried multiple topical medications.  She does find some relief with using cold.  She denies any groin pain.  She has had physical therapy in the past and has noticed improvement while in PT.  Past medical history reviewed Medications reviewed Allergies reviewed  Review of Systems As above    Objective:   Physical Exam  Well-developed, well-nourished.  No acute distress  Left shoulder: Exam deferred  Left hip: Smooth painless hip range of motion with negative logroll.  There is some tenderness to palpation along the greater trochanter area.  Positive Trendelenburg bilaterally.  Neurovascularly intact distally.      Assessment & Plan:   Chronic left shoulder pain secondary to rotator cuff tear Left hip greater trochanteric pain syndrome  I recommended that Breanna return to Dr. ONEIDA for continued treatment of her left shoulder.  She may be a candidate for PRP at some point.  For her left hip pain, I have given her some home exercises to work on hip abductor and pelvic stabilizer strengthening.  I also recommended daily ice massage.  This note was dictated using Dragon naturally speaking software and may contain errors in syntax, spelling, or content which have not been identified prior to signing this note.

## 2024-07-02 NOTE — Telephone Encounter (Signed)
 I am confused about the note.  The sports med referral that I placed I did ask them to also do the hip.  It was not just the shoulder.  I am not sure if she is looking at something separate but I did request that they evaluate both.  Unfortunately at this time though I do not have any specific recommendations for an osteopath that actually practices manipulation.

## 2024-07-06 NOTE — Telephone Encounter (Signed)
 Ok to call [facility] and tell them to do regular therapy but not aquatic. Let them lnkow she has fibromyalgia so needs a provider who might best be able to work with her

## 2024-07-08 NOTE — Telephone Encounter (Signed)
 Spoke w/Nikki and advised her that pt is unable to do Aquatic therapy due to chlorine allergy. Asked that she get placed with someone who can work with her doing regular therapy for Fibromyalgia. She stated that this can be taken care of.

## 2024-07-31 DIAGNOSIS — Z129 Encounter for screening for malignant neoplasm, site unspecified: Secondary | ICD-10-CM | POA: Diagnosis not present

## 2024-07-31 DIAGNOSIS — L821 Other seborrheic keratosis: Secondary | ICD-10-CM | POA: Diagnosis not present

## 2024-07-31 DIAGNOSIS — D225 Melanocytic nevi of trunk: Secondary | ICD-10-CM | POA: Diagnosis not present

## 2024-07-31 DIAGNOSIS — D2361 Other benign neoplasm of skin of right upper limb, including shoulder: Secondary | ICD-10-CM | POA: Diagnosis not present

## 2024-07-31 DIAGNOSIS — Z85820 Personal history of malignant melanoma of skin: Secondary | ICD-10-CM | POA: Diagnosis not present

## 2024-07-31 DIAGNOSIS — L720 Epidermal cyst: Secondary | ICD-10-CM | POA: Diagnosis not present

## 2024-07-31 DIAGNOSIS — B353 Tinea pedis: Secondary | ICD-10-CM | POA: Diagnosis not present

## 2024-07-31 DIAGNOSIS — L603 Nail dystrophy: Secondary | ICD-10-CM | POA: Diagnosis not present

## 2024-08-06 ENCOUNTER — Encounter: Payer: Self-pay | Admitting: Sports Medicine

## 2024-10-10 ENCOUNTER — Ambulatory Visit (INDEPENDENT_AMBULATORY_CARE_PROVIDER_SITE_OTHER): Admitting: Family Medicine

## 2024-10-10 ENCOUNTER — Encounter: Payer: Self-pay | Admitting: Family Medicine

## 2024-10-10 VITALS — BP 126/70 | HR 79 | Ht 64.0 in | Wt 137.6 lb

## 2024-10-10 DIAGNOSIS — M792 Neuralgia and neuritis, unspecified: Secondary | ICD-10-CM | POA: Diagnosis not present

## 2024-10-10 DIAGNOSIS — G8929 Other chronic pain: Secondary | ICD-10-CM | POA: Diagnosis not present

## 2024-10-10 DIAGNOSIS — M797 Fibromyalgia: Secondary | ICD-10-CM | POA: Diagnosis not present

## 2024-10-10 DIAGNOSIS — R7401 Elevation of levels of liver transaminase levels: Secondary | ICD-10-CM

## 2024-10-10 DIAGNOSIS — Z780 Asymptomatic menopausal state: Secondary | ICD-10-CM | POA: Diagnosis not present

## 2024-10-10 MED ORDER — GABAPENTIN 100 MG PO CAPS: 100.0000 mg | ORAL_CAPSULE | Freq: Every day | ORAL | 3 refills | Status: AC

## 2024-10-10 MED ORDER — TRAMADOL HCL 50 MG PO TABS: 50.0000 mg | ORAL_TABLET | Freq: Three times a day (TID) | ORAL | 2 refills | Status: DC | PRN

## 2024-10-10 NOTE — Assessment & Plan Note (Signed)
 Indication for chronic opioid: Fibromyalgia  Medication and dose: Tramadol  50 mg TID PRN # pills per month: 90.   Last UDS date: 06/25/24 Opioid Treatment Agreement signed (Y/N): Y 02/24/23 Opioid Treatment Agreement last reviewed with patient:  06/25/24 [facility] reviewed this encounter (include red flags):  Y F/U : 3 months

## 2024-10-10 NOTE — Progress Notes (Signed)
 Established Patient Office Visit  Patient ID: Breanna Casey, female    DOB: 1956/05/11  Age: 68 y.o. MRN: [id number] PCP: Alvan Dorothyann BIRCH, MD  Chief Complaint  Patient presents with   Pain Management    Subjective:     HPI  Discussed the use of AI scribe software for clinical note transcription with the patient, who gave verbal consent to proceed.  History of Present Illness Breanna Casey is a 68 year old female with fibromyalgia who presents with a severe flare-up of symptoms.  Widespread musculoskeletal pain - Severe exacerbation of fibromyalgia symptoms this week - Severe pain primarily in the right leg, described as intolerable and interfering with sleep - Discomfort also present in the left arm and shoulder, uncertain of the cause but attributed to fibromyalgia - Tramadol  taken regularly for pain, but unable to refill prescription since end of October due to scheduling issues - Gabapentin  previously trialed for six weeks without recall of adverse effects, discontinued for unclear reasons - Unable to take Lyrica  Sleep disturbance - Sleep disrupted due to severe right leg pain - No relief from pain even with tramadol   Paresthesia and chest sensations - Peculiar throbbing or vibrating sensation in the chest, especially when near vibratory tools or when cars pass over road plates - Blood pressure and pulse remain normal during these episodes     ROS    Objective:     BP 126/70   Pulse 79   Ht 5' 4 (1.626 m)   Wt 137 lb 9.6 oz (62.4 kg)   SpO2 100%   BMI 23.62 kg/m    Physical Exam Vitals and nursing note reviewed.  Constitutional:      Appearance: Normal appearance.  HENT:     Head: Normocephalic and atraumatic.  Eyes:     Conjunctiva/sclera: Conjunctivae normal.  Cardiovascular:     Rate and Rhythm: Normal rate and regular rhythm.  Pulmonary:     Effort: Pulmonary effort is normal.     Breath sounds: Normal breath sounds.   Skin:    General: Skin is warm and dry.  Neurological:     Mental Status: She is alert.  Psychiatric:        Mood and Affect: Mood normal.      No results found for any visits on 10/10/24.    The 10-year ASCVD risk score (Arnett DK, et al., 2019) is: 7.4%    Assessment & Plan:   Problem List Items Addressed This Visit       Other   Neurogenic pain, leg, right   Trial of gabapentin  at bedtime.  Start with 1 cap and then okay to gradually increase up to 3 if needed we did discuss that we can go even higher if she feels like she needs more medicine for better pain control.  Did warn about sedation so encouraged her to make sure that she is taking it in the evening.      Fibromyalgia   Relevant Medications   traMADol  (ULTRAM ) 50 MG tablet   gabapentin  (NEURONTIN ) 100 MG capsule   Other Relevant Orders   CMP14+EGFR   Encounter for chronic pain management - Primary   Indication for chronic opioid: Fibromyalgia  Medication and dose: Tramadol  50 mg TID PRN # pills per month: 90.   Last UDS date: 06/25/24 Opioid Treatment Agreement signed (Y/N): Y 02/24/23 Opioid Treatment Agreement last reviewed with patient:  06/25/24 [facility] reviewed this encounter (include red flags):  Y F/U : 3  months      Relevant Medications   traMADol  (ULTRAM ) 50 MG tablet   Elevated ALT measurement   Relevant Orders   CMP14+EGFR   Other Visit Diagnoses       Post-menopausal       Relevant Orders   DG Bone Density       Assessment and Plan Assessment & Plan Fibromyalgia with chronic pain syndrome Severe fibromyalgia flare with significant pain in the right leg and left arm, affecting sleep. Gabapentin  may help with nerve-related pain. Lyrica not tolerated. - Prescribed gabapentin  100 mg, instructed to take 1-3 tablets in the evening or at bedtime as needed for pain management. - Refilled tramadol  prescription for 90 days with two refills. - Advised to monitor for side effects and adjust  dosage as needed. - Discussed potential referral to sports medicine if shoulder pain persists.  General Health Maintenance DEXAis due as of November 1st. She prefers to schedule in person. - Advised to schedule mammogram appointment in person at the imaging center. - Offered to place her on a cancellation list for earlier appointment availability.  Declines mammogram   Return in about 3 months (around 01/10/2025) for pain management.    Dorothyann Byars, MD [facility] Medicine at [facility]

## 2024-10-10 NOTE — Assessment & Plan Note (Signed)
 Trial of gabapentin  at bedtime.  Start with 1 cap and then okay to gradually increase up to 3 if needed we did discuss that we can go even higher if she feels like she needs more medicine for better pain control.  Did warn about sedation so encouraged her to make sure that she is taking it in the evening.

## 2024-10-11 LAB — CMP14+EGFR
ALT: 21 IU/L (ref 0–32)
AST: 25 IU/L (ref 0–40)
Albumin: 4.5 g/dL (ref 3.9–4.9)
Alkaline Phosphatase: 74 IU/L (ref 49–135)
BUN/Creatinine Ratio: 7 — ABNORMAL LOW (ref 12–28)
BUN: 5 mg/dL — ABNORMAL LOW (ref 8–27)
Bilirubin Total: 0.3 mg/dL (ref 0.0–1.2)
CO2: 23 mmol/L (ref 20–29)
Calcium: 10 mg/dL (ref 8.7–10.3)
Chloride: 103 mmol/L (ref 96–106)
Creatinine, Ser: 0.67 mg/dL (ref 0.57–1.00)
Globulin, Total: 2.3 g/dL (ref 1.5–4.5)
Glucose: 74 mg/dL (ref 70–99)
Potassium: 5 mmol/L (ref 3.5–5.2)
Sodium: 141 mmol/L (ref 134–144)
Total Protein: 6.8 g/dL (ref 6.0–8.5)
eGFR: 95 mL/min/1.73 (ref >59)

## 2024-10-14 ENCOUNTER — Ambulatory Visit: Payer: Self-pay | Admitting: Family Medicine

## 2024-10-14 NOTE — Progress Notes (Signed)
 Your lab work is within acceptable range and there are no concerning findings.   ?

## 2024-10-24 ENCOUNTER — Encounter: Payer: Self-pay | Admitting: Family Medicine

## 2024-10-25 ENCOUNTER — Other Ambulatory Visit: Payer: Self-pay | Admitting: *Deleted

## 2024-10-25 MED ORDER — CLARITHROMYCIN 500 MG PO TABS: 500.0000 mg | ORAL_TABLET | Freq: Two times a day (BID) | ORAL | 1 refills | Status: AC

## 2024-10-25 MED ORDER — CLARITHROMYCIN 500 MG PO TABS: 500.0000 mg | ORAL_TABLET | Freq: Two times a day (BID) | ORAL | 1 refills | Status: DC

## 2024-10-25 NOTE — Progress Notes (Signed)
 Called pharmacy and had to LVM asking that the Rx for Clarithromycin  (Biaxin ) be cancelled

## 2024-12-11 ENCOUNTER — Ambulatory Visit

## 2024-12-11 DIAGNOSIS — Z7952 Long term (current) use of systemic steroids: Secondary | ICD-10-CM

## 2024-12-11 DIAGNOSIS — Z780 Asymptomatic menopausal state: Secondary | ICD-10-CM | POA: Diagnosis not present

## 2024-12-12 NOTE — Progress Notes (Signed)
 Hi Breanna Casey, bone density test shows a T-score of -3.1 which is consistent with osteoporosis based on current guidelines.   The current recommendation for osteoporosis treatment includes:   #1 calcium-total of 1200 mg of calcium daily.  If you eat a very calcium rich diet you may be able to obtain that without a supplement.  If not, then I recommend calcium 500 mg twice a day.  There are several products over-the-counter such as Caltrate D and Viactiv chews which are great options that contain calcium and vitamin D . #2 vitamin D -recommend 800 international units daily. #3 exercise-recommend 30 minutes of weightbearing exercise 3 days a week.  Resistance training ,such as doing bands and light weights, can be particularly helpful. #4 medication-if you are not currently on a bone builder, also called a bisphosphonate, then this has been shown to be very helpful in maintaining bone strength, preventing further thinning of the bones, and reducing your risk for fractures.  I would highly recommend that you consider starting 1 of these medications.  If you are okay with that then please let us  know and we will send one to your pharmacy.  If you would like to discuss further we are happy to make an appointment for you so that we can go over options for treatment.

## 2025-01-09 ENCOUNTER — Ambulatory Visit: Admitting: Family Medicine

## 2025-01-09 ENCOUNTER — Encounter: Payer: Self-pay | Admitting: Family Medicine

## 2025-01-09 VITALS — BP 130/78 | Wt 141.1 lb

## 2025-01-09 DIAGNOSIS — M797 Fibromyalgia: Secondary | ICD-10-CM

## 2025-01-09 DIAGNOSIS — G8929 Other chronic pain: Secondary | ICD-10-CM

## 2025-01-09 DIAGNOSIS — J4530 Mild persistent asthma, uncomplicated: Secondary | ICD-10-CM

## 2025-01-09 MED ORDER — TRAMADOL HCL 50 MG PO TABS: 50.0000 mg | ORAL_TABLET | Freq: Three times a day (TID) | ORAL | 2 refills | Status: AC | PRN

## 2025-01-09 NOTE — Progress Notes (Signed)
 "  Established Patient Office Visit  Patient ID: Breanna Casey, female    DOB: 1955/12/12  Age: 69 y.o. MRN: [id number] PCP: Alvan Dorothyann BIRCH, MD  Chief Complaint  Patient presents with   Medical Management of Chronic Issues    Subjective:     HPI  Discussed the use of AI scribe software for clinical note transcription with the patient, who gave verbal consent to proceed.  History of Present Illness Breanna Casey is a 69 year old female with fibromyalgia who presents with worsening pain and sleep disturbances.  Musculoskeletal pain - Significant worsening of fibromyalgia symptoms, particularly affecting legs and feet every night starting around 10 PM - Pain described as unbearable - Severe discomfort also present in left shoulder and hip - Inadequate pain control due to alternating gabapentin  and tramadol , as advised by pharmacy warnings about potential seizure risk when combined  Sleep disturbances - Difficulty sleeping due to pain, especially at night - Gabapentin  100 mg taken at night to aid sleep; requires two pills to achieve sleep - Alternating gabapentin  and tramadol  leads to inadequate sleep - Tramadol  taken three times daily, spaced every six hours, but not effective in improving sleep  Medication management issues - Pharmacy warning regarding risk of seizures when combining gabapentin  and tramadol  - Alternating between gabapentin  and tramadol  to avoid interaction, resulting in suboptimal symptom control  Respiratory symptoms - Breathing difficulties attributed to dry air in home, worsened by cold weather - No wheezing - Breathing improves with one puff of rescue inhaler - Humidifier used but insufficient to alleviate dryness  Red itch spot on left midback she would like me to look at.       ROS    Objective:     BP 130/78   Wt 141 lb 0.8 oz (64 kg)   BMI 24.21 kg/m     Physical Exam Vitals and nursing note reviewed.  Constitutional:       Appearance: Normal appearance.  HENT:     Head: Normocephalic and atraumatic.  Eyes:     Conjunctiva/sclera: Conjunctivae normal.  Cardiovascular:     Rate and Rhythm: Normal rate and regular rhythm.  Pulmonary:     Effort: Pulmonary effort is normal.     Breath sounds: Normal breath sounds.  Skin:    General: Skin is warm and dry.     Comments: Left mid back with and slightly erythematous area.  No scale or distinct rash.   Neurological:     Mental Status: She is alert.  Psychiatric:        Mood and Affect: Mood normal.      No results found for any visits on 01/09/25.     The 10-year ASCVD risk score (Arnett DK, et al., 2019) is: 7.8%    Assessment & Plan:   Problem List Items Addressed This Visit       Respiratory   Asthma, mild persistent     Other   Fibromyalgia - Primary   Relevant Medications   traMADol  (ULTRAM ) 50 MG tablet   Encounter for chronic pain management   Indication for chronic opioid: Fibromyalgia  Medication and dose: Tramadol  50 mg TID PRN # pills per month: 90.   Last UDS date: 01/09/25 Opioid Treatment Agreement signed (Y/N): Y 02/24/23 Opioid Treatment Agreement last reviewed with patient:  06/25/24 [facility] reviewed this encounter (include red flags):  Y F/U : 3 months      Relevant Medications   traMADol  (ULTRAM ) 50 MG tablet  Other Relevant Orders   Drug Screen, Ur (12+Oxycodone +Crt)    Assessment and Plan Assessment & Plan Fibromyalgia with chronic pain Chronic fibromyalgia with significant pain in the left shoulder and hip, affecting sleep. Gabapentin  and tramadol  used for pain management with low seizure risk at low doses. - Continue gabapentin  and tramadol  for pain management. - Consider taking half a tramadol  with gabapentin  initially to assess tolerance. Or move tramadol  to every 6 hours to be able to space the gabapentin .   - Adjust tramadol  dosing to every six hours to allow for gabapentin  at  bedtime.  Asthma Exacerbated by dry indoor air, leading to nasal congestion and difficulty breathing. Rescue inhaler provides relief. - Use rescue inhaler as needed for asthma symptoms. - Consider using a humidifier in the bedroom to improve air quality.  General Health Maintenance - Obtained urine sample for routine monitoring.  Erythematous area on skin - possibly from dry skin and scratching. Moisturize well.  Monitor.   Return in about 3 months (around 04/08/2025) for Chronic Pain Mgt.    Dorothyann Byars, MD [facility] Primary Care & Sports Medicine at [facility]   "

## 2025-01-09 NOTE — Assessment & Plan Note (Signed)
 Indication for chronic opioid: Fibromyalgia  Medication and dose: Tramadol  50 mg TID PRN # pills per month: 90.   Last UDS date: 01/09/25 Opioid Treatment Agreement signed (Y/N): Y 02/24/23 Opioid Treatment Agreement last reviewed with patient:  06/25/24 N[facility]SRS reviewed this encounter (include red flags):  Y F/U : 3 months

## 2025-04-10 ENCOUNTER — Ambulatory Visit: Admitting: Family Medicine
